# Patient Record
Sex: Female | Born: 1950
Health system: Southern US, Community
[De-identification: ages and names within clinical notes are randomized; demographics above are authoritative.]

## PROBLEM LIST (undated history)

## (undated) DIAGNOSIS — M722 Plantar fascial fibromatosis: Secondary | ICD-10-CM

## (undated) DIAGNOSIS — R197 Diarrhea, unspecified: Secondary | ICD-10-CM

## (undated) DIAGNOSIS — K589 Irritable bowel syndrome without diarrhea: Secondary | ICD-10-CM

## (undated) DIAGNOSIS — G4700 Insomnia, unspecified: Secondary | ICD-10-CM

## (undated) DIAGNOSIS — G43909 Migraine, unspecified, not intractable, without status migrainosus: Secondary | ICD-10-CM

## (undated) DIAGNOSIS — H269 Unspecified cataract: Secondary | ICD-10-CM

## (undated) DIAGNOSIS — I10 Essential (primary) hypertension: Secondary | ICD-10-CM

## (undated) DIAGNOSIS — F32A Depression, unspecified: Secondary | ICD-10-CM

## (undated) DIAGNOSIS — F329 Major depressive disorder, single episode, unspecified: Secondary | ICD-10-CM

## (undated) DIAGNOSIS — M810 Age-related osteoporosis without current pathological fracture: Secondary | ICD-10-CM

## (undated) HISTORY — DX: Irritable bowel syndrome, unspecified: K58.9

## (undated) HISTORY — PX: ABDOMINAL HYSTERECTOMY: SHX81

## (undated) HISTORY — PX: BREAST CYST ASPIRATION: SHX578

## (undated) HISTORY — DX: Essential (primary) hypertension: I10

## (undated) HISTORY — DX: Diarrhea, unspecified: R19.7

## (undated) HISTORY — DX: Age-related osteoporosis without current pathological fracture: M81.0

## (undated) HISTORY — DX: Migraine, unspecified, not intractable, without status migrainosus: G43.909

## (undated) HISTORY — PX: EYE SURGERY: SHX253

## (undated) HISTORY — DX: Unspecified cataract: H26.9

## (undated) HISTORY — DX: Major depressive disorder, single episode, unspecified: F32.9

## (undated) HISTORY — DX: Plantar fascial fibromatosis: M72.2

## (undated) HISTORY — DX: Insomnia, unspecified: G47.00

## (undated) HISTORY — DX: Depression, unspecified: F32.A

---

## 1983-11-10 HISTORY — PX: VESICOVAGINAL FISTULA CLOSURE W/ TAH: SUR271

## 1998-07-18 ENCOUNTER — Other Ambulatory Visit: Admission: RE | Admit: 1998-07-18 | Discharge: 1998-07-18 | Payer: Self-pay | Admitting: Gynecology

## 1999-05-29 ENCOUNTER — Other Ambulatory Visit: Admission: RE | Admit: 1999-05-29 | Discharge: 1999-05-29 | Payer: Self-pay | Admitting: Gynecology

## 1999-07-03 ENCOUNTER — Other Ambulatory Visit: Admission: RE | Admit: 1999-07-03 | Discharge: 1999-07-03 | Payer: Self-pay | Admitting: Gynecology

## 1999-07-10 ENCOUNTER — Other Ambulatory Visit: Admission: RE | Admit: 1999-07-10 | Discharge: 1999-07-10 | Payer: Self-pay | Admitting: Gynecology

## 2000-01-13 ENCOUNTER — Encounter: Admission: RE | Admit: 2000-01-13 | Discharge: 2000-01-13 | Payer: Self-pay | Admitting: Gynecology

## 2000-01-13 ENCOUNTER — Encounter: Payer: Self-pay | Admitting: Gynecology

## 2000-05-28 ENCOUNTER — Other Ambulatory Visit: Admission: RE | Admit: 2000-05-28 | Discharge: 2000-05-28 | Payer: Self-pay | Admitting: Gynecology

## 2001-04-13 ENCOUNTER — Encounter: Admission: RE | Admit: 2001-04-13 | Discharge: 2001-04-13 | Payer: Self-pay | Admitting: Gynecology

## 2001-06-13 ENCOUNTER — Other Ambulatory Visit: Admission: RE | Admit: 2001-06-13 | Discharge: 2001-06-13 | Payer: Self-pay | Admitting: Gynecology

## 2001-06-15 ENCOUNTER — Other Ambulatory Visit: Admission: RE | Admit: 2001-06-15 | Discharge: 2001-06-15 | Payer: Self-pay | Admitting: Gynecology

## 2002-05-09 ENCOUNTER — Encounter: Admission: RE | Admit: 2002-05-09 | Discharge: 2002-05-09 | Payer: Self-pay | Admitting: Gynecology

## 2002-05-09 ENCOUNTER — Encounter: Payer: Self-pay | Admitting: Gynecology

## 2002-07-20 ENCOUNTER — Other Ambulatory Visit: Admission: RE | Admit: 2002-07-20 | Discharge: 2002-07-20 | Payer: Self-pay | Admitting: Gynecology

## 2002-07-25 ENCOUNTER — Encounter: Payer: Self-pay | Admitting: Gynecology

## 2002-07-25 ENCOUNTER — Encounter: Admission: RE | Admit: 2002-07-25 | Discharge: 2002-07-25 | Payer: Self-pay | Admitting: Gynecology

## 2003-05-23 ENCOUNTER — Other Ambulatory Visit: Admission: RE | Admit: 2003-05-23 | Discharge: 2003-05-23 | Payer: Self-pay | Admitting: Gynecology

## 2003-06-01 ENCOUNTER — Encounter: Admission: RE | Admit: 2003-06-01 | Discharge: 2003-06-01 | Payer: Self-pay | Admitting: Gynecology

## 2003-06-01 ENCOUNTER — Encounter: Payer: Self-pay | Admitting: Gynecology

## 2004-06-09 ENCOUNTER — Other Ambulatory Visit: Admission: RE | Admit: 2004-06-09 | Discharge: 2004-06-09 | Payer: Self-pay | Admitting: Gynecology

## 2004-06-10 ENCOUNTER — Encounter: Admission: RE | Admit: 2004-06-10 | Discharge: 2004-06-10 | Payer: Self-pay | Admitting: Gynecology

## 2004-09-17 ENCOUNTER — Ambulatory Visit: Payer: Self-pay | Admitting: Pulmonary Disease

## 2005-06-16 ENCOUNTER — Other Ambulatory Visit: Admission: RE | Admit: 2005-06-16 | Discharge: 2005-06-16 | Payer: Self-pay | Admitting: Gynecology

## 2005-06-24 ENCOUNTER — Encounter: Admission: RE | Admit: 2005-06-24 | Discharge: 2005-06-24 | Payer: Self-pay | Admitting: Gynecology

## 2005-10-30 ENCOUNTER — Ambulatory Visit: Payer: Self-pay | Admitting: Pulmonary Disease

## 2005-11-09 HISTORY — PX: BUNIONECTOMY: SHX129

## 2006-03-10 ENCOUNTER — Encounter: Payer: Self-pay | Admitting: Gynecology

## 2006-06-18 ENCOUNTER — Other Ambulatory Visit: Admission: RE | Admit: 2006-06-18 | Discharge: 2006-06-18 | Payer: Self-pay | Admitting: Gynecology

## 2006-06-25 ENCOUNTER — Encounter: Admission: RE | Admit: 2006-06-25 | Discharge: 2006-06-25 | Payer: Self-pay | Admitting: Gynecology

## 2006-10-25 ENCOUNTER — Ambulatory Visit: Payer: Self-pay | Admitting: Gastroenterology

## 2006-11-05 ENCOUNTER — Ambulatory Visit: Payer: Self-pay | Admitting: Internal Medicine

## 2006-11-12 ENCOUNTER — Ambulatory Visit: Payer: Self-pay | Admitting: Pulmonary Disease

## 2007-06-21 ENCOUNTER — Other Ambulatory Visit: Admission: RE | Admit: 2007-06-21 | Discharge: 2007-06-21 | Payer: Self-pay | Admitting: Gynecology

## 2007-07-06 ENCOUNTER — Encounter: Admission: RE | Admit: 2007-07-06 | Discharge: 2007-07-06 | Payer: Self-pay | Admitting: Gynecology

## 2007-12-01 ENCOUNTER — Telehealth (INDEPENDENT_AMBULATORY_CARE_PROVIDER_SITE_OTHER): Payer: Self-pay | Admitting: *Deleted

## 2007-12-01 DIAGNOSIS — I10 Essential (primary) hypertension: Secondary | ICD-10-CM | POA: Insufficient documentation

## 2007-12-01 HISTORY — DX: Essential (primary) hypertension: I10

## 2007-12-02 ENCOUNTER — Ambulatory Visit: Payer: Self-pay | Admitting: Pulmonary Disease

## 2007-12-02 DIAGNOSIS — F329 Major depressive disorder, single episode, unspecified: Secondary | ICD-10-CM

## 2007-12-08 LAB — CONVERTED CEMR LAB
BUN: 9 mg/dL (ref 6–23)
Basophils Relative: 0.2 % (ref 0.0–1.0)
CO2: 31 meq/L (ref 19–32)
Creatinine, Ser: 0.8 mg/dL (ref 0.4–1.2)
Eosinophils Absolute: 0 10*3/uL (ref 0.0–0.6)
Eosinophils Relative: 0.1 % (ref 0.0–5.0)
HCT: 43.9 % (ref 36.0–46.0)
MCV: 87.4 fL (ref 78.0–100.0)
Monocytes Absolute: 0.4 10*3/uL (ref 0.2–0.7)
Monocytes Relative: 7.6 % (ref 3.0–11.0)
Neutro Abs: 3 10*3/uL (ref 1.4–7.7)
Neutrophils Relative %: 64.7 % (ref 43.0–77.0)
Platelets: 166 10*3/uL (ref 150–400)
Potassium: 3.5 meq/L (ref 3.5–5.1)
Sodium: 139 meq/L (ref 135–145)
TSH: 0.73 microintl units/mL (ref 0.35–5.50)
WBC: 4.7 10*3/uL (ref 4.5–10.5)

## 2008-01-02 ENCOUNTER — Telehealth: Payer: Self-pay | Admitting: Adult Health

## 2008-01-02 ENCOUNTER — Encounter: Payer: Self-pay | Admitting: Adult Health

## 2008-01-02 ENCOUNTER — Ambulatory Visit: Payer: Self-pay | Admitting: Pulmonary Disease

## 2008-01-02 LAB — CONVERTED CEMR LAB
Inflenza A Ag: NEGATIVE
Influenza B Ag: NEGATIVE

## 2008-01-19 ENCOUNTER — Ambulatory Visit: Payer: Self-pay | Admitting: Pulmonary Disease

## 2008-02-07 ENCOUNTER — Telehealth (INDEPENDENT_AMBULATORY_CARE_PROVIDER_SITE_OTHER): Payer: Self-pay | Admitting: *Deleted

## 2008-04-23 ENCOUNTER — Ambulatory Visit: Payer: Self-pay | Admitting: Pulmonary Disease

## 2008-04-23 DIAGNOSIS — R51 Headache: Secondary | ICD-10-CM

## 2008-04-23 DIAGNOSIS — M722 Plantar fascial fibromatosis: Secondary | ICD-10-CM | POA: Insufficient documentation

## 2008-04-23 DIAGNOSIS — R519 Headache, unspecified: Secondary | ICD-10-CM | POA: Insufficient documentation

## 2008-04-23 DIAGNOSIS — R197 Diarrhea, unspecified: Secondary | ICD-10-CM

## 2008-07-10 ENCOUNTER — Encounter: Admission: RE | Admit: 2008-07-10 | Discharge: 2008-07-10 | Payer: Self-pay | Admitting: Gynecology

## 2008-08-27 ENCOUNTER — Telehealth (INDEPENDENT_AMBULATORY_CARE_PROVIDER_SITE_OTHER): Payer: Self-pay | Admitting: *Deleted

## 2008-11-07 ENCOUNTER — Telehealth: Payer: Self-pay | Admitting: Pulmonary Disease

## 2010-04-14 ENCOUNTER — Ambulatory Visit: Payer: Self-pay | Admitting: Pulmonary Disease

## 2010-04-14 ENCOUNTER — Telehealth (INDEPENDENT_AMBULATORY_CARE_PROVIDER_SITE_OTHER): Payer: Self-pay | Admitting: *Deleted

## 2010-04-14 DIAGNOSIS — G4700 Insomnia, unspecified: Secondary | ICD-10-CM | POA: Insufficient documentation

## 2010-12-09 NOTE — Assessment & Plan Note (Signed)
Summary: headache-ok per SN / cj   CC:  2 year ROV & add-on for migraine....  History of Present Illness: 60 y/o WF here for a follow up visit...    ~  April 14, 2010:  she called requesting an add-on appt after 67yr hiatus due to HA... hx migraines w/ Eval & Rx from DrLewitt in the past> she c/o the co-pay for specialists & hasn't been back to see him... prev well controlled on Topamax 100mg /d, Imipramine 50mg - 2 tabs daily, and Imitrex 100mg  as needed (she ran out of the Topamax & Imitrex w/ onset of HA this week)... also has hx HBP & prev well controlled on Lisinopril 40mg /d + HCTZ 25mg /d... notes BP up when she is in pain w/ HA & not the other way around... also notes difficulty sleeping w/ hx chronic persisitant insomnia- ran out of Ambien & not resting at all... we discussed restarting her regular med regimen and scheduling a CPX w/ CXR, EKG, fasting labs for later this yr...   Current Problem List:  HYPERTENSION (ICD-401.9) - controlled on LISINOPRIL 40mg /d, and HCTZ 25mg /d... BP= 140/90 w/ HA today> she tolerates meds well, takes regularly & home BP checks are OK when not in pain... denies fatigue, visual changes, CP, palipit, dizziness, syncope, dyspnea, edema, etc...  Hx of DIARRHEA (ICD-787.91) - on QUESTRAN 4gm packet daily as needed... this really helps her prev chr diarrhea problem and she feels it has been a life-saver!  Hx of PLANTAR FASCIITIS (ICD-728.71) - Eval and Rx by DrBednarz in past... she uses Ibuprofen Prn.  Hx of HEADACHE (ICD-784.0) - eval by DrLewitt and treated w/ TOPAMAX 100mg /d, IMIPRAMINE 50mg - 2daily, & IMITREX 100mg Prn... she still gets HA's but feels that they are manageable w/ this regimen...  Hx of DEPRESSION (ICD-311) - prev on Lexapro perscribed by DrLomax... she feels that it helped but ran out & doing OK since then (her husb died of sudden cardiac death 12/13/22)...  INSOMNIA, CHRONIC (ICD-307.42) - she has chronic persistant insomnia treated w/ ZOLPIDEM  10mg  Qhs...   Health Maintenance:  she had full labs from [place] in 2004, and from DrLomax in 2007 & all WNL.Marland Kitchen.  ~  GYN= DrLomax w/ hysterectomy in 1985... on VIVELLE patch... he does BMD's w/ report of Osteopenia 8/07...  ~  GI = DrPerry w/ colonoscopy 12/07 that was WNL... f/u planned 25yrs.  ~  Immunizations:  she gets the yearly Flu vaccine.   Preventive Screening-Counseling & Management  Alcohol-Tobacco     Smoking Status: never  Allergies (verified): No Known Drug Allergies  Comments:  Nurse/Medical Assistant: The patient's medications and allergies were reviewed with the patient and were updated in the Medication and Allergy Lists.  Past History:  Past Medical History: HYPERTENSION (ICD-401.9) Hx of DIARRHEA (ICD-787.91) Hx of PLANTAR FASCIITIS (ICD-728.71) Hx of HEADACHE (ICD-784.0) DEPRESSION (ICD-311) INSOMNIA, CHRONIC (ICD-307.42)  Past Surgical History: S/P hysterectomy in 1985 by DrLomax S/P right bunionectomy in 2007 by DrBednarz  Family History: Reviewed history and no changes required.  Social History: Reviewed history and no changes required. never smoked  Review of Systems      See HPI       The patient complains of headaches.  The patient denies anorexia, fever, weight loss, weight gain, vision loss, decreased hearing, hoarseness, chest pain, syncope, dyspnea on exertion, peripheral edema, prolonged cough, hemoptysis, abdominal pain, melena, hematochezia, severe indigestion/heartburn, hematuria, incontinence, muscle weakness, suspicious skin lesions, transient blindness, difficulty walking, depression, unusual weight change, abnormal bleeding,  enlarged lymph nodes, and angioedema.    Vital Signs:  Patient profile:   60 year old female Height:      63 inches Weight:      151.31 pounds BMI:     26.90 O2 Sat:      100 % on Room air Temp:     98.1 degrees F oral Pulse rate:   105 / minute BP sitting:   140 / 90  (right arm) Cuff size:    regular  Vitals Entered By: Randell Loop CMA (April 14, 2010 3:31 PM)  O2 Sat at Rest %:  100 O2 Flow:  Room air CC: 2 year ROV & add-on for migraine... Is Patient Diabetic? No Pain Assessment Patient in pain? yes      Onset of pain  headahce that no meds have helped   Physical Exam  Additional Exam:  WD, WN, 60 y/o WF in NAD... GENERAL:  Alert & oriented; pleasant & cooperative... HEENT:  [place]/AT, EOM-wnl, PERRLA, EACs-clear, TMs-wnl, NOSE-clear, THROAT-clear & wnl. NECK:  Supple w/ full ROM; no JVD; normal carotid impulses w/o bruits; no thyromegaly or nodules palpated; no lymphadenopathy. CHEST:  Clear to P & A; without wheezes/ rales/ or rhonchi. HEART:  Regular Rhythm; without murmurs/ rubs/ or gallops. ABDOMEN:  Soft & nontender; normal bowel sounds; no organomegaly or masses detected. EXT: without deformities or arthritic changes; no varicose veins/ venous insuffic/ or edema. NEURO:  CN's intact; motor testing normal; sensory testing normal; gait normal & balance OK. DERM:  No lesions noted; no rash etc...    Impression & Recommendations:  Problem # 1:  Hx of HEADACHE (ICD-784.0) She has hx migraines w/ prev eval DrLewitt... prev well controlled on regimen but ran out of mult drugs... we discussed re-starting meds & f/u later this yr... Topamax 100mg /d,  Imipramine 50mg - 2 daily,  Imitrex 100mg  as needed... Her updated medication list for this problem includes:    Imitrex 100 Mg Tabs (Sumatriptan succinate) .Marland Kitchen... As needed for migraine    Eq Ibuprofen 200 Mg Tabs (Ibuprofen) .Marland Kitchen... As needed  Problem # 2:  HYPERTENSION (ICD-401.9) Controlled on meds... BP tends to be elevated w/ pain esp HAs... refill meds, take them daily & monitor BOP at home- call for problems... Her updated medication list for this problem includes:    Lisinopril 40 Mg Tabs (Lisinopril) .Marland Kitchen... Take 1 tab by mouth once daily...    Hydrochlorothiazide 25 Mg Tabs (Hydrochlorothiazide) .Marland Kitchen... Take 1 tablet  by mouth once a day  Problem # 3:  INSOMNIA, CHRONIC (ICD-307.42) We discussed her chr persistant insomnia & the meed for gen AMBIEN 10mg  Qhs...   Problem # 4:  Hx of DIARRHEA (ICD-787.91) She uses the Questran Prn...  Problem # 5:  DEPRESSION (ICD-311) Prev on Lexapro but doing OK off this meds & doesn't want to restart at this point in time... The following medications were removed from the medication list:    Lexapro 20 Mg Tabs (Escitalopram oxalate) .Marland Kitchen... Take 1 tablet by mouth once a day Her updated medication list for this problem includes:    Imipramine Hcl 50 Mg Tabs (Imipramine hcl) .Marland Kitchen... Take 2  tablet by mouth once a day  Complete Medication List: 1)  Lisinopril 40 Mg Tabs (Lisinopril) .... Take 1 tab by mouth once daily.Marland KitchenMarland Kitchen 2)  Hydrochlorothiazide 25 Mg Tabs (Hydrochlorothiazide) .... Take 1 tablet by mouth once a day 3)  Questran 4 Gm Pack (Cholestyramine) .... Mix one packet daily with juice or apple  sauce 4)  Vivelle-dot 0.05 Mg/24hr Pttw (Estradiol) .... Twice a week 5)  Topamax 100 Mg Tabs (Topiramate) .... Take 1 tablet by mouth once a day 6)  Imitrex 100 Mg Tabs (Sumatriptan succinate) .... As needed for migraine 7)  Eq Ibuprofen 200 Mg Tabs (Ibuprofen) .... As needed 8)  Imipramine Hcl 50 Mg Tabs (Imipramine hcl) .... Take 2  tablet by mouth once a day 9)  Zolpidem Tartrate 10 Mg Tabs (Zolpidem tartrate) .... Take one tablet by mouth at bedtime for sleep  Patient Instructions: 1)  Today we updated your med list- see below.... 2)  We refilled your meds for 2011... 3)  It's been several yrs for a physical w/ CXR, EKG, & Fasting labs work... we should set this up sometime in the fall... 4)  Call for any problems... Prescriptions: ZOLPIDEM TARTRATE 10 MG TABS (ZOLPIDEM TARTRATE) TAKE ONE TABLET by mouth at bedtime FOR SLEEP  #30 x 6   Entered and Authorized by:   Michele Mcalpine MD   Signed by:   Michele Mcalpine MD on 04/14/2010   Method used:   Print then Give to  Patient   RxID:   [id number] IMIPRAMINE HCL 50 MG  TABS (IMIPRAMINE HCL) Take 2  tablet by mouth once a day  #180 x 4   Entered and Authorized by:   Michele Mcalpine MD   Signed by:   Michele Mcalpine MD on 04/14/2010   Method used:   Print then Give to Patient   RxID:   [id number] IMITREX 100 MG  TABS (SUMATRIPTAN SUCCINATE) as needed for migraine  #9 x prn   Entered and Authorized by:   Michele Mcalpine MD   Signed by:   Michele Mcalpine MD on 04/14/2010   Method used:   Print then Give to Patient   RxID:   [id number] TOPAMAX 100 MG  TABS (TOPIRAMATE) Take 1 tablet by mouth once a day  #90 x 4   Entered and Authorized by:   Michele Mcalpine MD   Signed by:   Michele Mcalpine MD on 04/14/2010   Method used:   Print then Give to Patient   RxID:   [id number] VIVELLE-DOT 0.05 MG/24HR  PTTW (ESTRADIOL) twice a week  #3 mo supply x 4   Entered and Authorized by:   Michele Mcalpine MD   Signed by:   Michele Mcalpine MD on 04/14/2010   Method used:   Print then Give to Patient   RxID:   [id number] QUESTRAN 4 GM  PACK (CHOLESTYRAMINE) mix one packet daily with juice or apple sauce  #90 x 4   Entered and Authorized by:   Michele Mcalpine MD   Signed by:   Michele Mcalpine MD on 04/14/2010   Method used:   Print then Give to Patient   RxID:   [id number] HYDROCHLOROTHIAZIDE 25 MG  TABS (HYDROCHLOROTHIAZIDE) Take 1 tablet by mouth once a day  #90 x 4   Entered and Authorized by:   Michele Mcalpine MD   Signed by:   Michele Mcalpine MD on 04/14/2010   Method used:   Print then Give to Patient   RxID:   [id number] LISINOPRIL 40 MG  TABS (LISINOPRIL) take 1 tab by mouth once daily...  #90 x 4   Entered and Authorized by:   Michele Mcalpine MD   Signed by:   Michele Mcalpine MD on 04/14/2010  Method used:   Print then Give to Patient   RxID:   [id number]

## 2010-12-09 NOTE — Progress Notes (Signed)
Summary: appt  Phone Note Call from Patient Call back at Home Phone [phone] Call back at [phone]   Caller: Patient Call For: nadel Reason for Call: Talk to Nurse Summary of Call: terrible headache, woke during night, want to see SN.  Her mother Tally Joe coming in this pm.  Can you see her then. Initial call taken by: Eugene Gavia,  April 14, 2010 9:32 AM  Follow-up for Phone Call        Spoke with pt.  Pt states she has "a terrible headache and woke up with it during the night."  Denies blurred vision and any other symptoms. Pt states mother is being seen by SN at 330 today and requesting to be seen then by him too.  However, if pt is unable to be worked in with SN today she is requesting to see TP today.  Will forward message to SN-pls advise if it is ok to work pt in today. Thanks! Gweneth Dimitri RN  April 14, 2010 9:37 AM   Additional Follow-up for Phone Call Additional follow up Details #1::        per SN---add pt on to schedule this afternoon please.  thanks Randell Loop CMA  April 14, 2010 11:54 AM     Additional Follow-up for Phone Call Additional follow up Details #2::    appt scheduled in IDX.  Pt aware SN will work her in this afternoon.  Gweneth Dimitri RN  April 14, 2010 11:57 AM

## 2011-03-04 ENCOUNTER — Telehealth: Payer: Self-pay | Admitting: Pulmonary Disease

## 2011-03-04 MED ORDER — TOPIRAMATE 100 MG PO TABS: 100.0000 mg | ORAL_TABLET | Freq: Every day | ORAL | Status: DC

## 2011-03-04 MED ORDER — HYDROCHLOROTHIAZIDE 50 MG PO TABS: 25.0000 mg | ORAL_TABLET | Freq: Every day | ORAL | Status: DC

## 2011-03-04 MED ORDER — IMIPRAMINE HCL 50 MG PO TABS: ORAL_TABLET | ORAL | Status: DC

## 2011-03-04 NOTE — Telephone Encounter (Addendum)
Last seen April 14, 2010.  Pt is scheduled for follow-up on 04/01/2011 @ 3:30 pm with SN. RX for Topamax and HCTZ sent to [facility] in [place]. Pt aware she will need to keep this appt for further refills.

## 2011-03-04 NOTE — Telephone Encounter (Signed)
Pt aware rx sent to pharmacy and she needs to keep f/u for further refills.

## 2011-03-23 ENCOUNTER — Other Ambulatory Visit: Payer: Self-pay | Admitting: *Deleted

## 2011-03-23 MED ORDER — TOPIRAMATE 100 MG PO TABS: 100.0000 mg | ORAL_TABLET | Freq: Two times a day (BID) | ORAL | Status: DC

## 2011-03-25 ENCOUNTER — Encounter: Payer: Self-pay | Admitting: Pulmonary Disease

## 2011-03-27 ENCOUNTER — Telehealth: Payer: Self-pay | Admitting: Pulmonary Disease

## 2011-03-27 NOTE — Telephone Encounter (Signed)
Pt says [facility] never received refill on Topamax. I called [facility] and gave verbal for Topamax refill for # 60 with 5 refills as sent by Leigh on 03/23/2011. Pt is aware.

## 2011-03-30 ENCOUNTER — Telehealth: Payer: Self-pay | Admitting: Pulmonary Disease

## 2011-03-30 NOTE — Telephone Encounter (Signed)
We've already called this in multiple times!! --on May 14 and again on May 18. Called [facility] and spoke with Baldo Ash who stated he did not get the rx- therefore gave a verbal order for Topamax 100mg  # 60 x 5 refills.  Called and spoke with pt and informed her of the above information.

## 2011-04-01 ENCOUNTER — Ambulatory Visit (INDEPENDENT_AMBULATORY_CARE_PROVIDER_SITE_OTHER): Payer: Self-pay | Admitting: Pulmonary Disease

## 2011-04-01 ENCOUNTER — Encounter: Payer: Self-pay | Admitting: Pulmonary Disease

## 2011-04-01 DIAGNOSIS — G4700 Insomnia, unspecified: Secondary | ICD-10-CM

## 2011-04-01 DIAGNOSIS — I10 Essential (primary) hypertension: Secondary | ICD-10-CM

## 2011-04-01 DIAGNOSIS — R197 Diarrhea, unspecified: Secondary | ICD-10-CM

## 2011-04-01 DIAGNOSIS — R51 Headache: Secondary | ICD-10-CM

## 2011-04-01 MED ORDER — SUMATRIPTAN SUCCINATE 100 MG PO TABS: 100.0000 mg | ORAL_TABLET | Freq: Once | ORAL | Status: DC | PRN

## 2011-04-01 MED ORDER — ZOLPIDEM TARTRATE 10 MG PO TABS: 10.0000 mg | ORAL_TABLET | Freq: Every evening | ORAL | Status: DC | PRN

## 2011-04-01 MED ORDER — LISINOPRIL 40 MG PO TABS: 40.0000 mg | ORAL_TABLET | Freq: Every day | ORAL | Status: DC

## 2011-04-01 MED ORDER — CHOLESTYRAMINE 4 G PO PACK: 1.0000 | PACK | Freq: Every day | ORAL | Status: DC | PRN

## 2011-04-01 MED ORDER — TOPIRAMATE 100 MG PO TABS: 100.0000 mg | ORAL_TABLET | Freq: Two times a day (BID) | ORAL | Status: DC

## 2011-04-01 MED ORDER — HYDROCHLOROTHIAZIDE 25 MG PO TABS: 25.0000 mg | ORAL_TABLET | Freq: Every day | ORAL | Status: DC

## 2011-04-01 MED ORDER — IMIPRAMINE HCL 50 MG PO TABS: ORAL_TABLET | ORAL | Status: DC

## 2011-04-01 NOTE — Patient Instructions (Signed)
Today we updated your med list in EPIC...    Continue your current meds the same> we refilled your meds for 90d supplies as requested...  Let's get on track w/ diet & exercise...  Call for any problems.Marland KitchenMarland Kitchen

## 2011-04-01 NOTE — Progress Notes (Signed)
Subjective:    Patient ID: Kelli Contreras, female    DOB: 1951/03/02, 60 y.o.   MRN: [id number]  HPI 60 y/o WF here for a follow up visit...   ~  April 14, 2010:  she called requesting an add-on appt after 51yr hiatus due to HA... hx migraines w/ Eval & Rx from DrLewitt in the past> she c/o the co-pay for specialists & hasn't been back to see him... prev well controlled on Topamax 100mg /d, Imipramine 50mg - 2 tabs daily, and Imitrex 100mg  as needed (she ran out of the Topamax & Imitrex w/ onset of HA this week)... also has hx HBP & prev well controlled on Lisinopril 40mg /d + HCTZ 25mg /d... notes BP up when she is in pain w/ HA & not the other way around... also notes difficulty sleeping w/ hx chronic persisitant insomnia- ran out of Ambien & not resting at all... we discussed restarting her regular med regimen and scheduling a CPX w/ CXR, EKG, fasting labs for later this yr...  ~  May 23,2012:  Yearly ROV & she requests refill prescriptions... She hasn't had CPX- labs, Xray, EKG, etc- due to no insurance & requests that we don't do these important screening procedures at this time... 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          Problem List:  HYPERTENSION (ICD-401.9) - controlled on LISINOPRIL 40mg /d, and HCTZ 25mg /d... BP= 138/84 today> she tolerates meds well, takes regularly & home BP checks are OK when not in pain... denies fatigue, visual changes, CP, palipit, dizziness, syncope, dyspnea, edema, etc...  Hx of DIARRHEA (ICD-787.91) - on QUESTRAN 4gm packet daily as needed... this really helps her prev chr diarrhea problem and she feels it has been a life-saver!  Hx of PLANTAR FASCIITIS (ICD-728.71) - Eval and Rx by DrBednarz in past... she uses Ibuprofen Prn.  Hx of HEADACHE (ICD-784.0) - eval by DrLewitt and treated w/ TOPAMAX 100mg /d,  IMIPRAMINE 50mg - 2daily, & IMITREX 100mg Prn... she still gets HA's but feels that they are manageable w/ this regimen...  She requests refill Rx from [place] to to cost of seeing DrLewitt & the fact that she has been well controlled on a stable med regimen...  Hx of DEPRESSION (ICD-311) - prev on Lexapro perscribed by DrLomax... she feels that it helped but ran out & doing OK since then (her husb died of sudden cardiac death 2022/12/04)...  INSOMNIA, CHRONIC (ICD-307.42) - she has chronic persistant insomnia treated w/ ZOLPIDEM 10mg  Qhs...  We discussed alternative OTC Rx w/ Melatonin & TylenolPM.  Health Maintenance:  she had full labs from [place] in 2004, and from DrLomax in 2007 & all WNL.Marland Kitchen. ~  GYN= DrLomax w/ hysterectomy in 1985... on VIVELLE patch... he does BMD's w/ report of Osteopenia 8/07... ~  GI = DrPerry w/ colonoscopy 12/07 that was WNL... f/u planned 9yrs. ~  Immunizations:  she gets the yearly Flu vaccine.   Past Surgical History  Procedure Date  . Vesicovaginal fistula closure w/ tah 1985    Dr. Nicholas Lose  . Bunionectomy 2007    Dr. Lestine Box    Outpatient Encounter Prescriptions as of 04/01/2011  Medication Sig Dispense Refill  . cholestyramine (QUESTRAN) 4 G packet Take 1 packet by mouth daily as needed.        . hydrochlorothiazide 50 MG tablet  Take 0.5 tablets (25 mg total) by mouth daily.  30 tablet  0  . ibuprofen (ADVIL,MOTRIN) 200 MG tablet Take 200 mg by mouth every 6 (six) hours as needed.        Marland Kitchen imipramine (TOFRANIL) 50 MG tablet 2 tablets once a day  60 tablet  0  . lisinopril (PRINIVIL,ZESTRIL) 40 MG tablet Take 40 mg by mouth daily.        Marland Kitchen topiramate (TOPAMAX) 100 MG tablet Take 1 tablet (100 mg total) by mouth 2 (two) times daily.  60 tablet  5  . zolpidem (AMBIEN) 10 MG tablet Take 10 mg by mouth at bedtime as needed.          Allergies not on file >> NKDA...   Review of Systems         See HPI - all other systems neg except as noted... The patient complains of  headaches.  The patient denies anorexia, fever, weight loss, weight gain, vision loss, decreased hearing, hoarseness, chest pain, syncope, dyspnea on exertion, peripheral edema, prolonged cough, hemoptysis, abdominal pain, melena, hematochezia, severe indigestion/heartburn, hematuria, incontinence, muscle weakness, suspicious skin lesions, transient blindness, difficulty walking, depression, unusual weight change, abnormal bleeding, enlarged lymph nodes, and angioedema.     Objective:   Physical Exam     WD, WN, 60 y/o WF in NAD... GENERAL:  Alert & oriented; pleasant & cooperative... HEENT:  [place]/AT, EOM-wnl, PERRLA, EACs-clear, TMs-wnl, NOSE-clear, THROAT-clear & wnl. NECK:  Supple w/ full ROM; no JVD; normal carotid impulses w/o bruits; no thyromegaly or nodules palpated; no lymphadenopathy. CHEST:  Clear to P & A; without wheezes/ rales/ or rhonchi. HEART:  Regular Rhythm; without murmurs/ rubs/ or gallops. ABDOMEN:  Soft & nontender; normal bowel sounds; no organomegaly or masses detected. EXT: without deformities or arthritic changes; no varicose veins/ venous insuffic/ or edema. NEURO:  CN's intact; motor testing normal; sensory testing normal; gait normal & balance OK. DERM:  No lesions noted; no rash etc...   Assessment & Plan:   HBP>  Well controlled on Lisinopril & HCT Rx, continue same...  Headaches>  Prev thorough eval by DrLewitt & she has been well controlled on regimen of Topamax, Imipramine, Imitrex & she requests refills from [place]...  Insomnia>  Stable on Ambien for her CPI problem...  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

## 2011-04-05 ENCOUNTER — Encounter: Payer: Self-pay | Admitting: Pulmonary Disease

## 2011-05-26 ENCOUNTER — Telehealth: Payer: Self-pay | Admitting: Pulmonary Disease

## 2011-05-26 NOTE — Telephone Encounter (Signed)
PATIENT RETURNED CALL.  SHE REALLY NEEDS TO TALK TO DR NADEL.

## 2011-05-26 NOTE — Telephone Encounter (Signed)
Spoke with the pt and she request to speak directly to Dr. Kriste Basque. I advised he is with patients but I could get him a message for her. She states she needs to speak to him about writing her a letter for employment. 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 Kriste Basque would know what she meant/ Pt refused to give any more details and asked that SN call her. I advised I will forward the message. Carron Curie, CMA

## 2011-05-26 NOTE — Telephone Encounter (Signed)
[email or web address].Damari Suastegui, CMA  

## 2011-05-27 NOTE — Telephone Encounter (Signed)
Pt return call. Kelli Contreras  °

## 2011-05-28 NOTE — Telephone Encounter (Signed)
PT RETURNED CALL. SAYS DR NADEL HAD CALLED HER BUT CALLED THE HOUSE PHONE. PT ASKS THAT SHE BE CALLED AT [phone]. Kelli Contreras

## 2011-05-29 NOTE — Telephone Encounter (Signed)
PT CALLED BACK AGAIN- REQUESTS TO SPEAK TO DR NADEL. [phone]. Kelli Contreras

## 2011-06-01 NOTE — Telephone Encounter (Signed)
[phone] pt called back  Kelli Contreras

## 2011-06-02 NOTE — Telephone Encounter (Signed)
LMTCB

## 2011-06-03 NOTE — Telephone Encounter (Signed)
Pt returning call can be reached at [phone].Kelli Contreras

## 2011-06-04 NOTE — Telephone Encounter (Signed)
Pt states she spoke with Dr. Kriste Basque about the letter and nothing further needed. Carron Curie, CMA

## 2011-06-05 ENCOUNTER — Telehealth: Payer: Self-pay | Admitting: Emergency Medicine

## 2011-06-05 NOTE — Telephone Encounter (Signed)
Contacted by pt this pm to inform me that she has had sinus pressure, facial pain, tooth pain for about 18 hours. Some mild nasal drainage, not purulent, no fevers. I asked her to start using warm compresses, decongestants. Called in script for Avelox 400mg  po qd x 10 days to [facility] [phone] at her request.

## 2011-06-10 ENCOUNTER — Telehealth: Payer: Self-pay | Admitting: Pulmonary Disease

## 2011-06-10 NOTE — Telephone Encounter (Signed)
Leigh I can not tell you called her, pls advise thanks

## 2011-06-10 NOTE — Telephone Encounter (Signed)
Called and spoke with pt and she will bring her info up here tomorrow. 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  i will request her chart again to double check to see if there was any letter in there around that time from [facility].

## 2011-06-11 NOTE — Telephone Encounter (Signed)
Pt came up here and dropped off letter from [facility].  She left before i got a chance to speak to her and then called back.  i was busy with pts and was unable to speak with the pt and then she showed up again.  TD spoke with pt about what SN recs and if she needs a letter done then she will need to let us know.  Pt stated that she will be back this afternoon with the letter.

## 2011-09-29 ENCOUNTER — Telehealth: Payer: Self-pay | Admitting: Pulmonary Disease

## 2011-09-29 MED ORDER — LISINOPRIL 40 MG PO TABS: 40.0000 mg | ORAL_TABLET | Freq: Every day | ORAL | Status: DC

## 2011-09-29 MED ORDER — TOPIRAMATE 100 MG PO TABS: 100.0000 mg | ORAL_TABLET | Freq: Two times a day (BID) | ORAL | Status: DC

## 2011-09-29 NOTE — Telephone Encounter (Signed)
Spoke with pt to verify the msg. Rxs were sent to pharm 90 day supply per pt request.

## 2011-09-29 NOTE — Telephone Encounter (Signed)
LMOMTCB x 1. Need to verify which medications are needed.

## 2011-10-02 ENCOUNTER — Other Ambulatory Visit (HOSPITAL_COMMUNITY): Payer: Self-pay | Admitting: Gynecology

## 2011-10-02 DIAGNOSIS — Z1231 Encounter for screening mammogram for malignant neoplasm of breast: Secondary | ICD-10-CM

## 2011-11-09 ENCOUNTER — Ambulatory Visit (HOSPITAL_COMMUNITY)
Admission: RE | Admit: 2011-11-09 | Discharge: 2011-11-09 | Disposition: A | Discharge status: Home / Self Care | Payer: Self-pay | Source: Ambulatory Visit | Attending: Gynecology | Admitting: Gynecology

## 2011-11-09 DIAGNOSIS — Z1231 Encounter for screening mammogram for malignant neoplasm of breast: Secondary | ICD-10-CM

## 2011-12-02 ENCOUNTER — Other Ambulatory Visit: Payer: Self-pay | Admitting: Allergy

## 2011-12-02 MED ORDER — HYDROCHLOROTHIAZIDE 25 MG PO TABS: 25.0000 mg | ORAL_TABLET | Freq: Every day | ORAL | Status: DC

## 2011-12-02 NOTE — Telephone Encounter (Signed)
[facility]  [address] hctz 25 mg  1 poqd #90

## 2011-12-14 ENCOUNTER — Telehealth: Payer: Self-pay | Admitting: Pulmonary Disease

## 2011-12-14 MED ORDER — HYDROCHLOROTHIAZIDE 25 MG PO TABS: 25.0000 mg | ORAL_TABLET | Freq: Every day | ORAL | Status: DC

## 2011-12-14 MED ORDER — LISINOPRIL 20 MG PO TABS: 20.0000 mg | ORAL_TABLET | Freq: Two times a day (BID) | ORAL | Status: DC

## 2011-12-14 NOTE — Telephone Encounter (Signed)
Called and verified with pt request for HCTZ 25mg  qd and to change Lisinopril 40mg  qd to Lisinopril 20mg  bid because she does not have insurance and can get a 3 month supply at [facility] for $10. Spoke with LA and she advised okay to change Lisinopril to 20mg  bid for insurance purposes after speaking with SN. Rx refills sent to pharmacy and pt aware.

## 2012-04-18 ENCOUNTER — Other Ambulatory Visit: Payer: Self-pay | Admitting: Pulmonary Disease

## 2012-04-28 ENCOUNTER — Other Ambulatory Visit: Payer: Self-pay | Admitting: Pulmonary Disease

## 2012-05-03 ENCOUNTER — Telehealth: Payer: Self-pay | Admitting: Pulmonary Disease

## 2012-05-03 NOTE — Telephone Encounter (Signed)
Pt last seen 04/06/11 told to f/u 1 year. No pending apt. Please advise if okay to refill SN thanks

## 2012-05-04 ENCOUNTER — Telehealth: Payer: Self-pay | Admitting: Pulmonary Disease

## 2012-05-04 MED ORDER — IMIPRAMINE HCL 50 MG PO TABS: ORAL_TABLET | ORAL | Status: DC

## 2012-05-04 MED ORDER — TOPIRAMATE 100 MG PO TABS: 100.0000 mg | ORAL_TABLET | Freq: Two times a day (BID) | ORAL | Status: DC

## 2012-05-04 NOTE — Telephone Encounter (Signed)
Per SN---pt will need to schedule a follow up but ok to refill the topamax.  Thanks.

## 2012-05-04 NOTE — Telephone Encounter (Signed)
My mistake- I had sent the imipramine by accident. I called and spoke with pharmacist and cancelled this rx and called in the topamax for pt. I spoke with her and explained what happened and she verbalized understanding and states nothing further needed.

## 2012-05-04 NOTE — Telephone Encounter (Signed)
Spoke with pt and advised will refill med, but needs appt scheduled. I have sent refill to pharm and appt scheduled for 06-10-12.

## 2012-06-10 ENCOUNTER — Telehealth: Payer: Self-pay | Admitting: Pulmonary Disease

## 2012-06-10 ENCOUNTER — Ambulatory Visit: Payer: Self-pay | Admitting: Pulmonary Disease

## 2012-06-10 NOTE — Telephone Encounter (Signed)
Spoke with pt and she states that her issue was already taken care of by TD and nothing further needed.

## 2012-06-14 NOTE — Telephone Encounter (Signed)
This has been done per the pts request and placed in the mail to her.

## 2012-06-14 NOTE — Telephone Encounter (Signed)
Leigh, do you have these papers pt dropped off because ittates she is wanting a call back because she has questions.

## 2012-08-01 ENCOUNTER — Telehealth: Payer: Self-pay | Admitting: Pulmonary Disease

## 2012-08-01 MED ORDER — IMIPRAMINE HCL 50 MG PO TABS: ORAL_TABLET | ORAL | Status: DC

## 2012-08-01 NOTE — Telephone Encounter (Signed)
1 month supply was sent to [facility] for the pt to be made aware and also advised that she keep f/u for future refills

## 2012-08-19 ENCOUNTER — Telehealth: Payer: Self-pay | Admitting: Pulmonary Disease

## 2012-08-19 MED ORDER — TOPIRAMATE 100 MG PO TABS: 100.0000 mg | ORAL_TABLET | Freq: Two times a day (BID) | ORAL | Status: DC

## 2012-08-19 NOTE — Telephone Encounter (Signed)
I spoke with [facility] and was advised pt has refills on her lisinopril. I called and spoke with pt and made her aware of this. I have sent Topamax to the pharmacy. Nothing further was needed

## 2012-08-29 ENCOUNTER — Encounter: Payer: Self-pay | Admitting: *Deleted

## 2012-08-30 ENCOUNTER — Other Ambulatory Visit (INDEPENDENT_AMBULATORY_CARE_PROVIDER_SITE_OTHER): Payer: BC Managed Care – PPO

## 2012-08-30 ENCOUNTER — Encounter: Payer: Self-pay | Admitting: Pulmonary Disease

## 2012-08-30 ENCOUNTER — Ambulatory Visit (INDEPENDENT_AMBULATORY_CARE_PROVIDER_SITE_OTHER)
Admission: RE | Admit: 2012-08-30 | Discharge: 2012-08-30 | Disposition: A | Discharge status: Home / Self Care | Payer: BC Managed Care – PPO | Source: Ambulatory Visit | Attending: Pulmonary Disease | Admitting: Pulmonary Disease

## 2012-08-30 ENCOUNTER — Ambulatory Visit (INDEPENDENT_AMBULATORY_CARE_PROVIDER_SITE_OTHER): Payer: BC Managed Care – PPO | Admitting: Pulmonary Disease

## 2012-08-30 VITALS — BP 112/80 | HR 86 | Temp 98.4°F | Ht 63.0 in | Wt 153.8 lb

## 2012-08-30 DIAGNOSIS — R079 Chest pain, unspecified: Secondary | ICD-10-CM

## 2012-08-30 DIAGNOSIS — Z0000 Encounter for general adult medical examination without abnormal findings: Secondary | ICD-10-CM

## 2012-08-30 DIAGNOSIS — G4700 Insomnia, unspecified: Secondary | ICD-10-CM

## 2012-08-30 DIAGNOSIS — I10 Essential (primary) hypertension: Secondary | ICD-10-CM

## 2012-08-30 DIAGNOSIS — R51 Headache: Secondary | ICD-10-CM

## 2012-08-30 LAB — CBC WITH DIFFERENTIAL/PLATELET
Basophils Relative: 0.5 % (ref 0.0–3.0)
Eosinophils Absolute: 0 10*3/uL (ref 0.0–0.7)
Lymphocytes Relative: 32 % (ref 12.0–46.0)
MCHC: 32.9 g/dL (ref 30.0–36.0)
MCV: 90.3 fl (ref 78.0–100.0)
Monocytes Absolute: 0.4 10*3/uL (ref 0.1–1.0)
Neutrophils Relative %: 59.9 % (ref 43.0–77.0)
Platelets: 189 10*3/uL (ref 150.0–400.0)
RBC: 4.53 Mil/uL (ref 3.87–5.11)
WBC: 5.8 10*3/uL (ref 4.5–10.5)

## 2012-08-30 MED ORDER — ZOLPIDEM TARTRATE 10 MG PO TABS: 10.0000 mg | ORAL_TABLET | Freq: Every evening | ORAL | Status: DC | PRN

## 2012-08-30 MED ORDER — SUMATRIPTAN SUCCINATE 100 MG PO TABS: 100.0000 mg | ORAL_TABLET | Freq: Once | ORAL | Status: DC | PRN

## 2012-08-30 MED ORDER — METOPROLOL SUCCINATE ER 50 MG PO TB24: 50.0000 mg | ORAL_TABLET | Freq: Every day | ORAL | Status: DC

## 2012-08-30 MED ORDER — LISINOPRIL 20 MG PO TABS: 20.0000 mg | ORAL_TABLET | Freq: Every day | ORAL | Status: DC

## 2012-08-30 NOTE — Progress Notes (Signed)
Subjective:    Patient ID: Kelli Contreras, female    DOB: [birth year], 61 y.o.   MRN: [id number]  HPI 61 y/o WF here for a follow up visit...   ~  April 14, 2010:  she called requesting an add-on appt after 50yr hiatus due to HA... hx migraines w/ Eval & Rx from DrLewitt in the past> she c/o the co-pay for specialists & hasn't been back to see him... prev well controlled on Topamax 100mg /d, Imipramine 50mg - 2 tabs daily, and Imitrex 100mg  as needed (she ran out of the Topamax & Imitrex w/ onset of HA this week)... also has hx HBP & prev well controlled on Lisinopril 40mg /d + HCTZ 25mg /d... notes BP up when she is in pain w/ HA & not the other way around... also notes difficulty sleeping w/ hx chronic persisitant insomnia- ran out of Ambien & not resting at all... we discussed restarting her regular med regimen and scheduling a CPX w/ CXR, EKG, fasting labs for later this yr...  ~  May 23,2012:  Yearly ROV & she requests refill prescriptions... She hasn't had CPX- labs, Xray, EKG, etc- due to no insurance & requests that we don't do these important screening procedures at this time... 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  ~  August 30, 2012:  33mo ROV & Kelli Contreras presents c/o several month hx SS CP that appears to come on w/ activity & go away w/ rest; it's been present for several months & has recently incr frequency to ~1/wk or so; it comes on w/ activ, feels like a dull pressure in chest, radiates out to the arms, lasts <39min & resolves spont w/ rest; it is occas assoc w/ SOB  & she has had some dizziness but she denies palpit, syncope, edema...  As prev noted she has NEVER had a baseline CXR, EKG, Labs as she has not had insurance & refused CPX etc... Now she has insurance coverage through a new job & wants to get this evaluated... Known HBP,  nonsmoker, but we don't have baseline Lipids or BS...    Cricopharyngeus dysfunction>  Notes occas episodes of throat closing, can't breathe, can't get the air in, face feels numb; offered Klonopin Rx but she says rare episodes & wants to hold off...    CP>  Presented 10/13 w/ SSCP and abn EKG==> meds adjusted (decr Lisinopril & added ASA, MetopER), referred to Cards for further eval...    HBP>  Controlled on Lisin20Bid, Hct25; BP= 112/80, not checking at home; denies recent HA, palpit, SOB, edema...    HxDiarrhea>  On Questran but only uses this as needed & averages <1/month...    HxMigraines>  On Imipramine100, Topamax100Bid, Imitrex 100mg  prn; she has been out of the Imitrex for along time due to cost, using OTC analgesics as needed...    Chronic Insomnia>  On Ambien 10mg  Qhs as needed for sleep...    Anxiety>  Under a lot of stress- mother has moved in w/ her, no currently on an anxiolytic rx... We reviewed prob list, meds, xrays and labs> see below for updates >>  CXR 10/13 showed norm heart size, clear lungs, osteopenic appearing bones... EKG 10/13 showed NSR, rate 85, IVCD, poor R progression V1-3... LABS 10/13: (Non-fasting) Chems- wnl;  CBC- wnl;  TSH=0.76  Problem List:  HYPERTENSION (ICD-401.9) - controlled on LISINOPRIL 40mg /d, and HCTZ 25mg /d...  ~  5/12:  BP= 138/84> she tolerates meds well, takes regularly & home BP checks are OK when not in pain; denies fatigue, visual changes, CP, palipit, dizziness, syncope, dyspnea, edema, etc... ~  10/13:  BP= 112/80 on Lisin40 & Hct25; presented w/ CP/ SOB & we decided to decr Lisin20 & add MetopER50, refer to Cards...  CHEST PAIN >> ~  10/13:  See above - referred to Cards  Hx of DIARRHEA (ICD-787.91) - on QUESTRAN 4gm packet daily as needed... this really helps her prev chr diarrhea problem and she feels it has been a life-saver! ~  10/13:  She notes that she only uses the Questran about once per month now, doing very well w/o  recurrent diarrhea...  Hx of PLANTAR FASCIITIS (ICD-728.71) - Eval and Rx by DrBednarz in past... she uses Ibuprofen Prn.  Hx of HEADACHE (ICD-784.0) - eval by DrLewitt and treated w/ TOPAMAX 100mg /d, IMIPRAMINE 50mg - 2daily, & IMITREX 100mg Prn... she still gets HA's but feels that they are manageable w/ this regimen...  She requests refill Rx from [place] due to cost of seeing DrLewitt & the fact that she has been well controlled on a stable med regimen...  Hx of DEPRESSION (ICD-311) - prev on Lexapro perscribed by DrLomax... she feels that it helped but ran out & doing OK since then (her husb died of sudden cardiac death 2022/12/07)...  INSOMNIA, CHRONIC (ICD-307.42) - she has chronic persistant insomnia treated w/ ZOLPIDEM 10mg  Qhs...  We discussed alternative OTC Rx w/ Melatonin & TylenolPM.  Health Maintenance:  she had full labs from [place] in 2004, and from DrLomax in 2007 & all WNL.Marland Kitchen. ~  GYN= DrLomax w/ hysterectomy in 1985... on VIVELLE patch... he does BMD's w/ report of Osteopenia 8/07... ~  GI = DrPerry w/ colonoscopy 12/07 that was WNL... f/u planned 66yrs. ~  Immunizations:  she gets the yearly Flu vaccine.   Past Surgical History  Procedure Date  . Vesicovaginal fistula closure w/ tah 1985    Dr. Nicholas Lose  . Bunionectomy 2007    Dr. Lestine Box    Outpatient Encounter Prescriptions as of 08/30/2012  Medication Sig Dispense Refill  . cholestyramine (QUESTRAN) 4 G packet Take 1 packet by mouth daily as needed.  90 each  3  . hydrochlorothiazide (HYDRODIURIL) 25 MG tablet Take 1 tablet (25 mg total) by mouth daily.  90 tablet  3  . ibuprofen (ADVIL,MOTRIN) 200 MG tablet Take 200 mg by mouth every 6 (six) hours as needed.        Marland Kitchen imipramine (TOFRANIL) 50 MG tablet 2 tablets daily  60 tablet  0  . lisinopril (PRINIVIL,ZESTRIL) 20 MG tablet Take 1 tablet (20 mg total) by mouth 2 (two) times daily.  180 tablet  3  . SUMAtriptan (IMITREX) 100 MG tablet Take 1 tablet (100 mg total) by mouth once as  needed for migraine.  90 tablet  5  . topiramate (TOPAMAX) 100 MG tablet Take 1 tablet (100 mg total) by mouth 2 (two) times daily.  60 tablet  1  . zolpidem (AMBIEN) 10 MG tablet Take 1 tablet (10 mg total) by mouth at bedtime as needed.  90 tablet  3    Allergies not on file >> NKDA...   Review of Systems         See HPI - all other systems neg except as noted... The patient complains of headaches.  The  patient denies anorexia, fever, weight loss, weight gain, vision loss, decreased hearing, hoarseness, chest pain, syncope, dyspnea on exertion, peripheral edema, prolonged cough, hemoptysis, abdominal pain, melena, hematochezia, severe indigestion/heartburn, hematuria, incontinence, muscle weakness, suspicious skin lesions, transient blindness, difficulty walking, depression, unusual weight change, abnormal bleeding, enlarged lymph nodes, and angioedema.     Objective:   Physical Exam     WD, WN, 61 y/o WF in NAD... GENERAL:  Alert & oriented; pleasant & cooperative... HEENT:  /AT, EOM-wnl, PERRLA, EACs-clear, TMs-wnl, NOSE-clear, THROAT-clear & wnl. NECK:  Supple w/ full ROM; no JVD; normal carotid impulses w/o bruits; no thyromegaly or nodules palpated; no lymphadenopathy. CHEST:  Clear to P & A; without wheezes/ rales/ or rhonchi. HEART:  Regular Rhythm; without murmurs/ rubs/ or gallops. ABDOMEN:  Soft & nontender; normal bowel sounds; no organomegaly or masses detected. EXT: without deformities or arthritic changes; no varicose veins/ venous insuffic/ or edema. NEURO:  CN's intact; motor testing normal; sensory testing normal; gait normal & balance OK. DERM:  No lesions noted; no rash etc...  RADIOLOGY DATA:  Reviewed in the EPIC EMR & discussed w/ the patient...  LABORATORY DATA:  Reviewed in the EPIC EMR & discussed w/ the patient...   Assessment & Plan:   Chest Pain>  She has indeterminate CP w/ an abn EKG and we will eval risk factors & refer to Cards for further eval;  note that her husb died of sudden cardiac death in 2007/04/25; in the interim we will decr the Lisinopril40=>20 & add MetoprololER50mg /d...  HBP>  Well controlled on Lisinopril & HCT Rx, continue same...  Headaches>  Prev thorough eval by DrLewitt & she has been well controlled on regimen of Topamax, Imipramine, Imitrex & she requests refills from [place]...  Insomnia>  Stable on Ambien for her CPI problem...  Anxiety>  Mother has moved in, doesn't want anxiolytic Rx...   Patient's Medications  New Prescriptions   METOPROLOL SUCCINATE (TOPROL-XL) 50 MG 24 HR TABLET    Take 1 tablet (50 mg total) by mouth daily. Take with or immediately following a meal.  Previous Medications   CHOLESTYRAMINE (QUESTRAN) 4 G PACKET    Take 1 packet by mouth daily as needed.   HYDROCHLOROTHIAZIDE (HYDRODIURIL) 25 MG TABLET    Take 1 tablet (25 mg total) by mouth daily.   IBUPROFEN (ADVIL,MOTRIN) 200 MG TABLET    Take 200 mg by mouth every 6 (six) hours as needed.     IMIPRAMINE (TOFRANIL) 50 MG TABLET    2 tablets daily   TOPIRAMATE (TOPAMAX) 100 MG TABLET    Take 1 tablet (100 mg total) by mouth 2 (two) times daily.  Modified Medications   Modified Medication Previous Medication   LISINOPRIL (PRINIVIL,ZESTRIL) 20 MG TABLET lisinopril (PRINIVIL,ZESTRIL) 20 MG tablet      Take 1 tablet (20 mg total) by mouth daily.    Take 1 tablet (20 mg total) by mouth 2 (two) times daily.   SUMATRIPTAN (IMITREX) 100 MG TABLET SUMAtriptan (IMITREX) 100 MG tablet      Take 1 tablet (100 mg total) by mouth once as needed for migraine.    Take 1 tablet (100 mg total) by mouth once as needed for migraine.   ZOLPIDEM (AMBIEN) 10 MG TABLET zolpidem (AMBIEN) 10 MG tablet      Take 1 tablet (10 mg total) by mouth at bedtime as needed.    Take 1 tablet (10 mg total) by mouth at bedtime as needed.  Discontinued Medications  LISINOPRIL (PRINIVIL,ZESTRIL) 20 MG TABLET    Take 20 mg by mouth daily.

## 2012-08-30 NOTE — Patient Instructions (Addendum)
Today we updated your med list in our EPIC system...  Today we did your baseline CXR, EKG, & non-fasting blood work...     We decided to decr the Lisinopril 20mg  tabs to one daily, and add METOPROLOL-ER 50mg  one daily...  Continue your other meds the same & we wrote refill prescriptions as requested...  We will set up an appt w/ Cardiology for further eval (I would anticipate an Echo & probably a treadmill for a thorough assessment)...  Call for any questions...  Let's plan a follow up visit to check your progress in 3-4 months, sooner if needed.Marland KitchenMarland Kitchen

## 2012-08-31 LAB — BASIC METABOLIC PANEL
CO2: 30 mEq/L (ref 19–32)
Chloride: 106 mEq/L (ref 96–112)
GFR: 73.79 mL/min (ref >60.00)

## 2012-08-31 LAB — HEPATIC FUNCTION PANEL
ALT: 15 U/L (ref 0–35)
AST: 22 U/L (ref 0–37)
Bilirubin, Direct: 0.1 mg/dL (ref 0.0–0.3)
Total Bilirubin: 0.4 mg/dL (ref 0.3–1.2)
Total Protein: 7.2 g/dL (ref 6.0–8.3)

## 2012-08-31 MED ORDER — LISINOPRIL 20 MG PO TABS: 20.0000 mg | ORAL_TABLET | Freq: Every day | ORAL | Status: DC

## 2012-08-31 MED ORDER — ZOLPIDEM TARTRATE 10 MG PO TABS: 10.0000 mg | ORAL_TABLET | Freq: Every evening | ORAL | Status: DC | PRN

## 2012-09-01 ENCOUNTER — Telehealth: Payer: Self-pay | Admitting: Pulmonary Disease

## 2012-09-01 LAB — TSH: TSH: 0.76 u[IU]/mL (ref 0.35–5.50)

## 2012-09-01 MED ORDER — IMIPRAMINE HCL 50 MG PO TABS: ORAL_TABLET | ORAL | Status: DC

## 2012-09-01 NOTE — Telephone Encounter (Signed)
Refill has been sent in for the pt and i have called and she is aware. Nothing further is needed.

## 2012-09-06 NOTE — Progress Notes (Signed)
Quick Note:  Patient returned call. Advised of lab results / recs as stated by SN. Pt verbalized understanding and denied any questions. ______ 

## 2012-09-14 ENCOUNTER — Telehealth: Payer: Self-pay | Admitting: Pulmonary Disease

## 2012-09-14 MED ORDER — CIPROFLOXACIN HCL 250 MG PO TABS: 250.0000 mg | ORAL_TABLET | Freq: Two times a day (BID) | ORAL | Status: DC

## 2012-09-14 NOTE — Telephone Encounter (Signed)
Called and spoke with pt and she is aware of  SN recs to start on cipro 250 mg  #14  1 po bid and pt is aware that this has been sent to [facility] in [place].  Pt voiced her understanding and nothing further is needed.

## 2012-09-14 NOTE — Telephone Encounter (Signed)
Called, spoke with pt - believes she has a bladder infection.  Reports frequency and urgency urinating with only a small amount each time, has pressure and and odor to urine.  Symptoms started x 1 wk ago with dysuria starting over the past day.  No f/c/s.  Has increased the amount of water and cranberry juice with no relief.  Requesting further recs.  Dr. Kriste Basque, pls advise.  Thank you.  Last OV with Dr. Kriste Basque on 08/30/12  [facility] [place]  nkda - verified

## 2012-09-14 NOTE — Telephone Encounter (Signed)
Pt called back again

## 2012-09-28 ENCOUNTER — Ambulatory Visit (INDEPENDENT_AMBULATORY_CARE_PROVIDER_SITE_OTHER): Payer: BC Managed Care – PPO | Admitting: Cardiology

## 2012-09-28 ENCOUNTER — Encounter: Payer: Self-pay | Admitting: Cardiology

## 2012-09-28 VITALS — BP 122/86 | HR 77 | Ht 63.0 in | Wt 155.0 lb

## 2012-09-28 DIAGNOSIS — R072 Precordial pain: Secondary | ICD-10-CM | POA: Insufficient documentation

## 2012-09-28 DIAGNOSIS — I10 Essential (primary) hypertension: Secondary | ICD-10-CM

## 2012-09-28 DIAGNOSIS — R079 Chest pain, unspecified: Secondary | ICD-10-CM

## 2012-09-28 NOTE — Progress Notes (Signed)
HPI The patient presents for evaluation of chest discomfort. She's had a distant cardiac catheterization with no coronary disease. She reports having chest discomfort for the last 90 days or so. 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 She says while she is running between classes she will get some chest discomfort that is sharp. It can be out 8 out of 10 in intensity. She has gotten short of breath with this.   She does not describe any radiation to her jaw or to her arms. She does not describe associated nausea vomiting or diaphoresis. It may last for up to 30 minutes before it goes away. It is not always reproducible with activity and she has never had it at rest. This is the most exerting activity she does. She does not describe PND or orthopnea. She does not have palpitations, presyncope or syncope.   No Known Allergies  Current Outpatient Prescriptions  Medication Sig Dispense Refill  . aspirin 81 MG tablet Take 81 mg by mouth daily.      . cholestyramine (QUESTRAN) 4 G packet Take 1 packet by mouth daily as needed.  90 each  3  . ciprofloxacin (CIPRO) 250 MG tablet Take 1 tablet (250 mg total) by mouth 2 (two) times daily.  14 tablet  0  . hydrochlorothiazide (HYDRODIURIL) 25 MG tablet Take 1 tablet (25 mg total) by mouth daily.  90 tablet  3  . ibuprofen (ADVIL,MOTRIN) 200 MG tablet Take 200 mg by mouth every 6 (six) hours as needed.        Marland Kitchen imipramine (TOFRANIL) 50 MG tablet 2 tablets daily  180 tablet  3  . lisinopril (PRINIVIL,ZESTRIL) 20 MG tablet Take 1 tablet (20 mg total) by mouth daily.  90 tablet  3  . metoprolol succinate (TOPROL-XL) 50 MG 24 hr tablet Take 1 tablet (50 mg total) by mouth daily. Take with or immediately following a meal.  90 tablet  3  . SUMAtriptan (IMITREX) 100 MG tablet Take 1 tablet (100 mg total) by mouth once as needed for migraine.  30 tablet  5  . topiramate (TOPAMAX) 100 MG tablet Take 1  tablet (100 mg total) by mouth 2 (two) times daily.  60 tablet  1  . zolpidem (AMBIEN) 10 MG tablet Take 1 tablet (10 mg total) by mouth at bedtime as needed.  90 tablet  1    Past Medical History  Diagnosis Date  . Hypertension   . Diarrhea   . Plantar fasciitis   . Depression   . Insomnia     Past Surgical History  Procedure Date  . Vesicovaginal fistula closure w/ tah 1985    Dr. Nicholas Lose  . Bunionectomy 2007    Dr. Lestine Box    Family History  Problem Relation Age of Onset  . Hypertension      mother, father, brother  . CAD Father 23  . Valvular heart disease Mother     MVP    History   Social History  . Marital Status: Widowed    Spouse Name: N/A    Number of Children: 2  . Years of Education: N/A   Occupational History  . Not on file.   Social History Main Topics  . Smoking status: Never Smoker   . Smokeless tobacco: Never Used  . Alcohol Use: No  . Drug Use: No  . Sexually Active: Not on file  Other Topics Concern  . Not on file   Social History Narrative   One grandchild.   Data processing manager.    ROS:  Positive for migraines, joint pain.  Otherwise as stated in the HPI and negative for all other systems.   PHYSICAL EXAM BP 122/86  Pulse 77  Ht 5\' 3"  (1.6 m)  Wt 155 lb (70.308 kg)  BMI 27.46 kg/m2  SpO2 98% GENERAL:  Well appearing HEENT:  Pupils equal round and reactive, fundi not visualized, oral mucosa unremarkable NECK:  No jugular venous distention, waveform within normal limits, carotid upstroke brisk and symmetric, no bruits, no thyromegaly LYMPHATICS:  No cervical, inguinal adenopathy LUNGS:  Clear to auscultation bilaterally BACK:  No CVA tenderness CHEST:  Unremarkable HEART:  PMI not displaced or sustained,S1 and S2 within normal limits, no S3, no S4, no clicks, no rubs, no murmurs ABD:  Flat, positive bowel sounds normal in frequency in pitch, no bruits, no rebound, no guarding, no midline pulsatile mass, no hepatomegaly, no  splenomegaly EXT:  2 plus pulses throughout, no edema, no cyanosis no clubbing SKIN:  No rashes no nodules NEURO:  Cranial nerves II through XII grossly intact, motor grossly intact throughout PSYCH:  Cognitively intact, oriented to person place and time  EKG:  Sinus rhythm, rate 72, axis within normal limits, intervals within normal limits, no acute ST-T wave changes.  09/28/2012  ASSESSMENT AND PLAN  PRECORDIAL CHEST PAIN - The patient does have symptoms that are suggestive of possible obstructive coronary disease. I think the pretest probability of coronary disease is at least moderately high. Exercise treadmill testing would be in adequate for evaluation because of the low sensitivity. Therefore, stress perfusion imaging is indicated. Further evaluation will be based on these results.  Hypertension - Her blood pressure is well-controlled on the medications as listed and she will continue with these.  Risk reduction - We did talk about exercise. I will also check a lipid profile when she returns for her stress test. The goal will be an LDL less than 100.

## 2012-09-28 NOTE — Patient Instructions (Addendum)
The current medical regimen is effective;  continue present plan and medications.  Your physician has requested that you have a lexiscan myoview. For further information please visit [email or web address]. Please follow instruction sheet, as given.  Follow up in 1 month with Dr Hochrein. 

## 2012-10-11 ENCOUNTER — Ambulatory Visit (HOSPITAL_COMMUNITY): Payer: BC Managed Care – PPO | Attending: Cardiology | Admitting: Radiology

## 2012-10-11 VITALS — BP 136/101 | Ht 63.0 in | Wt 153.0 lb

## 2012-10-11 DIAGNOSIS — R0602 Shortness of breath: Secondary | ICD-10-CM

## 2012-10-11 DIAGNOSIS — Z8249 Family history of ischemic heart disease and other diseases of the circulatory system: Secondary | ICD-10-CM | POA: Insufficient documentation

## 2012-10-11 DIAGNOSIS — R079 Chest pain, unspecified: Secondary | ICD-10-CM | POA: Insufficient documentation

## 2012-10-11 DIAGNOSIS — R0609 Other forms of dyspnea: Secondary | ICD-10-CM | POA: Insufficient documentation

## 2012-10-11 DIAGNOSIS — I10 Essential (primary) hypertension: Secondary | ICD-10-CM | POA: Insufficient documentation

## 2012-10-11 DIAGNOSIS — R0989 Other specified symptoms and signs involving the circulatory and respiratory systems: Secondary | ICD-10-CM | POA: Insufficient documentation

## 2012-10-11 MED ORDER — AMINOPHYLLINE 25 MG/ML IV SOLN
150.0000 mg | Freq: Once | INTRAVENOUS | Status: AC
  Administered 2012-10-11: 150 mg via INTRAVENOUS

## 2012-10-11 MED ORDER — TECHNETIUM TC 99M SESTAMIBI GENERIC - CARDIOLITE
32.9000 | Freq: Once | INTRAVENOUS | Status: AC | PRN
  Administered 2012-10-11: 32.9 via INTRAVENOUS

## 2012-10-11 MED ORDER — REGADENOSON 0.4 MG/5ML IV SOLN
0.4000 mg | Freq: Once | INTRAVENOUS | Status: AC
  Administered 2012-10-11: 0.4 mg via INTRAVENOUS

## 2012-10-11 MED ORDER — TECHNETIUM TC 99M SESTAMIBI GENERIC - CARDIOLITE
11.0000 | Freq: Once | INTRAVENOUS | Status: AC | PRN
  Administered 2012-10-11: 11 via INTRAVENOUS

## 2012-10-11 NOTE — Progress Notes (Signed)
[facility] SITE 3 NUCLEAR MED [address] [id number] [place] [address] [phone]  Cardiology Nuclear Med Study  Kelli Contreras is a 61 y.o. female     MRN : [id number]     DOB: Oct 01, 1951  Procedure Date: 10/11/2012  Nuclear Med Background Indication for Stress Test:  Evaluation for Ischemia History:  Heart Cath: No CAD No date Cardiac Risk Factors: Family History - CAD and Hypertension  Symptoms:  Chest Pain, DOE, Fatigue, Light-Headedness and SOB   Nuclear Pre-Procedure Caffeine/Decaff Intake:  None NPO After: 7:30am   Lungs:  clear O2 Sat: 99% on room air. IV 0.9% NS with Angio Cath:  22g  IV Site: R Antecubital  IV Started by:  Cathlyn Parsons, RN  Chest Size (in):  36 Cup Size: C  Height: 5\' 3"  (1.6 m)  Weight:  153 lb (69.4 kg)  BMI:  Body mass index is 27.10 kg/(m^2). Tech Comments:  Toprol held x 36 hrs.    Nuclear Med Study 1 or 2 day study: 1 day  Stress Test Type:  Treadmill/Lexiscan  Reading MD: Charlton Haws, MD  Order Authorizing Provider:  Melany Guernsey  Resting Radionuclide: Technetium 67m Sestamibi  Resting Radionuclide Dose: 11.0 mCi   Stress Radionuclide:  Technetium 84m Sestamibi  Stress Radionuclide Dose: 32.9 mCi           Stress Protocol Rest HR: 80 Stress HR: 111  Rest BP: 136/101 Stress BP: 159/101  Exercise Time (min): n/a METS: n/a   Predicted Max HR: 162 bpm % Max HR: 68.52 bpm Rate Pressure Product: 62952   Dose of Adenosine (mg):  n/a Dose of Lexiscan: 0.4 mg  Dose of Atropine (mg): n/a Dose of Dobutamine: n/a mcg/kg/min (at max HR)  Stress Test Technologist: Milana Na, EMT-P  Nuclear Technologist:  Domenic Polite, CNMT     Rest Procedure:  Myocardial perfusion imaging was performed at rest 45 minutes following the intravenous administration of Technetium 39m Sestamibi. Rest ECG: NSR - Normal EKG  Stress Procedure:  The patient received IV Lexiscan 0.4 mg over 15-seconds with  concurrent low level exercise and then Technetium 98m Sestamibi was injected at 30-seconds while the patient continued walking one more minute. There were no significant changes, + sob, headache, and chest discomfort with Lexiscan. This patient had to be reversed with Aminophylline 150 mg IV her headache wouldn't go away. After the Aminophylline, she was symptom free. Quantitative spect images were obtained after a 45-minute delay. Stress ECG: No significant change from baseline ECG  QPS Raw Data Images:  Normal; no motion artifact; normal heart/lung ratio. Stress Images:  Normal homogeneous uptake in all areas of the myocardium. Rest Images:  Normal homogeneous uptake in all areas of the myocardium. Subtraction (SDS):  Normal Transient Ischemic Dilatation (Normal <1.22):  1.09 Lung/Heart Ratio (Normal <0.45):  0.32  Quantitative Gated Spect Images QGS EDV:  67 ml QGS ESV:  20 ml  Impression Exercise Capacity:  Lexiscan with low level exercise. BP Response:  Normal blood pressure response. Clinical Symptoms:  There is dyspnea. ECG Impression:  No significant ST segment change suggestive of ischemia. Comparison with Prior Nuclear Study: No images to compare  Overall Impression:  Normal stress nuclear study.  LV Ejection Fraction: 70%.  LV Wall Motion:  NL LV Function; NL Wall Motion   Charlton Haws

## 2012-10-14 ENCOUNTER — Telehealth: Payer: Self-pay | Admitting: Pulmonary Disease

## 2012-10-14 MED ORDER — TOPIRAMATE 100 MG PO TABS: 100.0000 mg | ORAL_TABLET | Freq: Two times a day (BID) | ORAL | Status: DC

## 2012-10-14 NOTE — Telephone Encounter (Signed)
rx for the topamax has been sent to [facility] and the pt is aware. Nothing further is needed.

## 2012-12-13 ENCOUNTER — Other Ambulatory Visit: Payer: Self-pay | Admitting: Pulmonary Disease

## 2012-12-24 ENCOUNTER — Other Ambulatory Visit: Payer: Self-pay

## 2013-01-24 ENCOUNTER — Telehealth: Payer: Self-pay | Admitting: Pulmonary Disease

## 2013-01-24 NOTE — Telephone Encounter (Signed)
lmomtcb x1 for pt 

## 2013-01-24 NOTE — Telephone Encounter (Signed)
I spoke with pt. 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 She is needing to come in on a late afternoon. Please advise SN thanks Last OV 08/30/12 No pending appt

## 2013-01-25 NOTE — Telephone Encounter (Signed)
I spoke with pt and is scheduled to come in for TB skin test. Pt was placed on allergy scheduled for this. Nothing further was needed

## 2013-01-25 NOTE — Telephone Encounter (Signed)
Per SN----ok for pt to come in for the tb skin test.  thanks

## 2013-01-25 NOTE — Telephone Encounter (Signed)
lmomtcb for pt 

## 2013-02-20 ENCOUNTER — Ambulatory Visit: Payer: BC Managed Care – PPO

## 2013-06-08 ENCOUNTER — Telehealth: Payer: Self-pay | Admitting: Pulmonary Disease

## 2013-06-08 NOTE — Telephone Encounter (Signed)
Pt ambien last refilled 08/31/13 #90 x 1 refill.  Last OV 08/30/12 No pending appt Please advise SN thanks

## 2013-06-09 MED ORDER — ZOLPIDEM TARTRATE 10 MG PO TABS: 10.0000 mg | ORAL_TABLET | Freq: Every evening | ORAL | Status: DC | PRN

## 2013-06-09 NOTE — Telephone Encounter (Signed)
Called, spoke with pt.  Kelli Contreras has faxed rx to [facility].  Informed her of this and below.  She verbalized understanding and voiced no further questions or concerns at this time.

## 2013-06-09 NOTE — Telephone Encounter (Signed)
Per SN---  SN can only write for the #90 with 1 refill.  She will need to check with [facility] and make sure they can fill this for her for the 6 month supply. thanks

## 2013-08-08 ENCOUNTER — Other Ambulatory Visit: Payer: Self-pay | Admitting: Pulmonary Disease

## 2013-09-14 ENCOUNTER — Other Ambulatory Visit: Payer: Self-pay

## 2013-10-04 ENCOUNTER — Other Ambulatory Visit: Payer: Self-pay | Admitting: Pulmonary Disease

## 2013-10-10 ENCOUNTER — Other Ambulatory Visit: Payer: Self-pay | Admitting: Pulmonary Disease

## 2013-10-18 ENCOUNTER — Telehealth: Payer: Self-pay | Admitting: Pulmonary Disease

## 2013-10-18 ENCOUNTER — Other Ambulatory Visit: Payer: Self-pay | Admitting: Pulmonary Disease

## 2013-10-18 MED ORDER — HYDROCHLOROTHIAZIDE 25 MG PO TABS: ORAL_TABLET | ORAL | Status: DC

## 2013-10-18 MED ORDER — TOPIRAMATE 100 MG PO TABS: 100.0000 mg | ORAL_TABLET | Freq: Two times a day (BID) | ORAL | Status: DC

## 2013-10-18 NOTE — Telephone Encounter (Signed)
Pt aware RX's have been refilled. Nothing further needed

## 2013-10-18 NOTE — Telephone Encounter (Signed)
I called pt. She reports she [facility]. I advised will do so. I called [facility] and cancelled RX's sent to them earlier. Nothing further needed

## 2013-11-12 ENCOUNTER — Other Ambulatory Visit: Payer: Self-pay | Admitting: Pulmonary Disease

## 2013-11-17 ENCOUNTER — Other Ambulatory Visit: Payer: Self-pay | Admitting: Pulmonary Disease

## 2013-11-22 ENCOUNTER — Telehealth: Payer: Self-pay | Admitting: Pulmonary Disease

## 2013-11-22 MED ORDER — IMIPRAMINE HCL 50 MG PO TABS: ORAL_TABLET | ORAL | Status: DC

## 2013-11-22 NOTE — Telephone Encounter (Signed)
Called and spoke with pt and she is aware that she will need appt for refills.  i scheduled appt for pt on 4/3 and refills have been sent to the pharmacy.  Pt is aware of SN retiring from primary care and she will think about who she would like to see and discuss at her next ov.

## 2014-01-12 ENCOUNTER — Other Ambulatory Visit: Payer: Self-pay | Admitting: Pulmonary Disease

## 2014-01-12 DIAGNOSIS — I10 Essential (primary) hypertension: Secondary | ICD-10-CM

## 2014-01-25 ENCOUNTER — Telehealth: Payer: Self-pay | Admitting: Pulmonary Disease

## 2014-01-25 NOTE — Telephone Encounter (Signed)
Error

## 2014-01-26 ENCOUNTER — Encounter: Payer: Self-pay | Admitting: Pulmonary Disease

## 2014-01-30 ENCOUNTER — Emergency Department (HOSPITAL_COMMUNITY): Payer: BC Managed Care – PPO

## 2014-01-30 ENCOUNTER — Emergency Department (HOSPITAL_COMMUNITY)
Admission: EM | Admit: 2014-01-30 | Discharge: 2014-01-30 | Disposition: A | Discharge status: Home / Self Care | Payer: BC Managed Care – PPO | Attending: Emergency Medicine | Admitting: Emergency Medicine

## 2014-01-30 ENCOUNTER — Ambulatory Visit (INDEPENDENT_AMBULATORY_CARE_PROVIDER_SITE_OTHER): Payer: BC Managed Care – PPO | Admitting: Adult Health

## 2014-01-30 ENCOUNTER — Encounter: Payer: Self-pay | Admitting: Adult Health

## 2014-01-30 VITALS — BP 78/60 | HR 111 | Temp 97.5°F | Ht 62.0 in | Wt 155.6 lb

## 2014-01-30 DIAGNOSIS — J029 Acute pharyngitis, unspecified: Secondary | ICD-10-CM | POA: Insufficient documentation

## 2014-01-30 DIAGNOSIS — G4700 Insomnia, unspecified: Secondary | ICD-10-CM | POA: Insufficient documentation

## 2014-01-30 DIAGNOSIS — F3289 Other specified depressive episodes: Secondary | ICD-10-CM | POA: Insufficient documentation

## 2014-01-30 DIAGNOSIS — I959 Hypotension, unspecified: Secondary | ICD-10-CM

## 2014-01-30 DIAGNOSIS — Z8739 Personal history of other diseases of the musculoskeletal system and connective tissue: Secondary | ICD-10-CM | POA: Insufficient documentation

## 2014-01-30 DIAGNOSIS — R0602 Shortness of breath: Secondary | ICD-10-CM | POA: Insufficient documentation

## 2014-01-30 DIAGNOSIS — R059 Cough, unspecified: Secondary | ICD-10-CM | POA: Insufficient documentation

## 2014-01-30 DIAGNOSIS — R000 Tachycardia, unspecified: Secondary | ICD-10-CM | POA: Insufficient documentation

## 2014-01-30 DIAGNOSIS — I10 Essential (primary) hypertension: Secondary | ICD-10-CM | POA: Insufficient documentation

## 2014-01-30 DIAGNOSIS — R05 Cough: Secondary | ICD-10-CM | POA: Insufficient documentation

## 2014-01-30 DIAGNOSIS — F329 Major depressive disorder, single episode, unspecified: Secondary | ICD-10-CM | POA: Insufficient documentation

## 2014-01-30 DIAGNOSIS — Z79899 Other long term (current) drug therapy: Secondary | ICD-10-CM | POA: Insufficient documentation

## 2014-01-30 DIAGNOSIS — Z7982 Long term (current) use of aspirin: Secondary | ICD-10-CM | POA: Insufficient documentation

## 2014-01-30 DIAGNOSIS — R52 Pain, unspecified: Secondary | ICD-10-CM | POA: Insufficient documentation

## 2014-01-30 LAB — CBC WITH DIFFERENTIAL/PLATELET
BASOS ABS: 0 10*3/uL (ref 0.0–0.1)
Basophils Relative: 0 % (ref 0–1)
Eosinophils Absolute: 0 10*3/uL (ref 0.0–0.7)
Eosinophils Relative: 0 % (ref 0–5)
HCT: 41.6 % (ref 36.0–46.0)
Hemoglobin: 14.1 g/dL (ref 12.0–15.0)
LYMPHS ABS: 0.6 10*3/uL — AB (ref 0.7–4.0)
LYMPHS PCT: 14 % (ref 12–46)
MCH: 29.5 pg (ref 26.0–34.0)
MCHC: 33.9 g/dL (ref 30.0–36.0)
MCV: 87 fL (ref 78.0–100.0)
MONO ABS: 0.5 10*3/uL (ref 0.1–1.0)
Monocytes Relative: 12 % (ref 3–12)
NEUTROS ABS: 3.1 10*3/uL (ref 1.7–7.7)
Neutrophils Relative %: 74 % (ref 43–77)
Platelets: 125 10*3/uL — ABNORMAL LOW (ref 150–400)
RBC: 4.78 MIL/uL (ref 3.87–5.11)
RDW: 13.4 % (ref 11.5–15.5)
WBC: 4.2 10*3/uL (ref 4.0–10.5)

## 2014-01-30 LAB — I-STAT CG4 LACTIC ACID, ED: Lactic Acid, Venous: 1.98 mmol/L (ref 0.5–2.2)

## 2014-01-30 LAB — URINALYSIS, ROUTINE W REFLEX MICROSCOPIC
BILIRUBIN URINE: NEGATIVE
Glucose, UA: NEGATIVE mg/dL
HGB URINE DIPSTICK: NEGATIVE
KETONES UR: NEGATIVE mg/dL
NITRITE: NEGATIVE
PH: 5.5 (ref 5.0–8.0)
Protein, ur: NEGATIVE mg/dL
Specific Gravity, Urine: 1.011 (ref 1.005–1.030)
Urobilinogen, UA: 0.2 mg/dL (ref 0.0–1.0)

## 2014-01-30 LAB — URINE MICROSCOPIC-ADD ON

## 2014-01-30 LAB — BASIC METABOLIC PANEL
BUN: 13 mg/dL (ref 6–23)
CALCIUM: 9.2 mg/dL (ref 8.4–10.5)
CHLORIDE: 101 meq/L (ref 96–112)
CO2: 27 meq/L (ref 19–32)
Creatinine, Ser: 1.29 mg/dL — ABNORMAL HIGH (ref 0.50–1.10)
GFR calc Af Amer: 51 mL/min — ABNORMAL LOW (ref >90)
GFR calc non Af Amer: 44 mL/min — ABNORMAL LOW (ref >90)
GLUCOSE: 108 mg/dL — AB (ref 70–99)
Potassium: 3.5 mEq/L — ABNORMAL LOW (ref 3.7–5.3)
Sodium: 140 mEq/L (ref 137–147)

## 2014-01-30 LAB — I-STAT TROPONIN, ED: Troponin i, poc: 0.01 ng/mL (ref 0.00–0.08)

## 2014-01-30 MED ORDER — SODIUM CHLORIDE 0.9 % IV BOLUS (SEPSIS)
1000.0000 mL | Freq: Once | INTRAVENOUS | Status: AC
  Administered 2014-01-30: 1000 mL via INTRAVENOUS

## 2014-01-30 NOTE — ED Notes (Signed)
Patient is aware that we need a urine specimen.  

## 2014-01-30 NOTE — Discharge Instructions (Signed)
Please stop taking all of your blood pressure medications until you see your primary care physician. Please also stop taking ambien and topamax as these medications can cause hypotension.   Hypotension As your heart beats, it forces blood through your arteries. This force is your blood pressure. If your blood pressure is too low for you to go about your normal activities or to support the organs of your body, you have hypotension. Hypotension is also referred to as low blood pressure. When your blood pressure becomes too low, you may not get enough blood to your brain. As a result, you may feel weak, feel lightheaded, or develop a rapid heart rate. In a more severe case, you may faint. CAUSES Various conditions can cause hypotension. These include:  Blood loss.  Dehydration.  Heart or endocrine problems.  Pregnancy.  Severe infection.  Not having a well-balanced diet filled with needed nutrients.  Severe allergic reactions (anaphylaxis). Some medicines, such as blood pressure medicine or water pills (diuretics), may lower your blood pressure below normal. Sometimes taking too much medicine or taking medicine not as directed can cause hypotension. TREATMENT  Hospitalization is sometimes required for hypotension if fluid or blood replacement is needed, if time is needed for medicines to wear off, or if further monitoring is needed. Treatment might include changing your diet, changing your medicines (including medicines aimed at raising your blood pressure), and use of support stockings. HOME CARE INSTRUCTIONS   Drink enough fluids to keep your urine clear or pale yellow.  Take your medicines as directed by your health care provider.  Get up slowly from reclining or sitting positions. This gives your blood pressure a chance to adjust.  Wear support stockings as directed by your health care provider.  Maintain a healthy diet by including nutritious food, such as fruits, vegetables, nuts,  whole grains, and lean meats. SEEK MEDICAL CARE IF:  You have vomiting or diarrhea.  You have a fever for more than 2 3 days.  You feel more thirsty than usual.  You feel weak and tired. SEEK IMMEDIATE MEDICAL CARE IF:   You have chest pain or a fast or irregular heartbeat.  You have a loss of feeling in some part of your body, or you lose movement in your arms or legs.  You have trouble speaking.  You become sweaty or feel lightheaded.  You faint. MAKE SURE YOU:   Understand these instructions.  Will watch your condition.  Will get help right away if you are not doing well or get worse. Document Released: 10/26/2005 Document Revised: 08/16/2013 Document Reviewed: 04/28/2013 [facility] Patient Information 2014 [place], [place].   Upper Respiratory Infection, Adult An upper respiratory infection (URI) is also known as the common cold. It is often caused by a type of germ (virus). Colds are easily spread (contagious). You can pass it to others by kissing, coughing, sneezing, or drinking out of the same glass. Usually, you get better in 1 or 2 weeks.  HOME CARE   Only take medicine as told by your doctor.  Use a warm mist humidifier or breathe in steam from a hot shower.  Drink enough water and fluids to keep your pee (urine) clear or pale yellow.  Get plenty of rest.  Return to work when your temperature is back to normal or as told by your doctor. You may use a face mask and wash your hands to stop your cold from spreading. GET HELP RIGHT AWAY IF:   After the first  few days, you feel you are getting worse.  You have questions about your medicine.  You have chills, shortness of breath, or brown or red spit (mucus).  You have yellow or brown snot (nasal discharge) or pain in the face, especially when you bend forward.  You have a fever, puffy (swollen) neck, pain when you swallow, or white spots in the back of your throat.  You have a bad headache, ear pain, sinus  pain, or chest pain.  You have a high-pitched whistling sound when you breathe in and out (wheezing).  You have a lasting cough or cough up blood.  You have sore muscles or a stiff neck. MAKE SURE YOU:   Understand these instructions.  Will watch your condition.  Will get help right away if you are not doing well or get worse. Document Released: 04/13/2008 Document Revised: 01/18/2012 Document Reviewed: 03/02/2011 [facility] Patient Information 2014 [place], [place].

## 2014-01-30 NOTE — ED Notes (Signed)
Bed: WA13 Expected date:  Expected time:  Means of arrival:  Comments: EMS hypotension  

## 2014-01-30 NOTE — ED Notes (Signed)
Patient walked to restroom, was only able to give a few drops of urine, not enough for a sample.

## 2014-01-30 NOTE — Assessment & Plan Note (Signed)
Symptomatic Hypotension with viral illness  Case discussed /reviewed with Dr. Kriste BasqueNadel    Plan  Will need transfer to ER for further evaluation and consideration of IV fluids for possible dehydration/volume depletion (on HTN rx )  EMS transport as pt not able to drive with dizziness/hypotension

## 2014-01-30 NOTE — ED Notes (Signed)
Per EMS-patient has had cold symptoms for three days-was at PCP's office this am-patient hypotensive and dizzy while standing-took BP meds last night

## 2014-01-30 NOTE — ED Notes (Signed)
Patient ambulated to restroom with standby assistance by this tech. Patient ambulated to restroom 1(was occupied) then ambulated to restroom 3(by room 20) with only standby assistance. RN Jari Favrescar notified.

## 2014-01-30 NOTE — ED Notes (Signed)
Patient given Ginger Ale per MD. Will reassess and see if patient can provide specimen

## 2014-01-30 NOTE — Progress Notes (Signed)
Subjective:    Patient ID: Kelli Contreras, female    DOB: [birth year], 63 y.o.   MRN: [id number]  HPI 63  y/o WF here for a follow up visit...   ~  April 14, 2010:  she called requesting an add-on appt after 4596yr hiatus due to HA... hx migraines w/ Eval & Rx from DrLewitt in the past> she c/o the co-pay for specialists & hasn't been back to see him... prev well controlled on Topamax 100mg /d, Imipramine 50mg - 2 tabs daily, and Imitrex 100mg  as needed (she ran out of the Topamax & Imitrex w/ onset of HA this week)... also has hx HBP & prev well controlled on Lisinopril 40mg /d + HCTZ 25mg /d... notes BP up when she is in pain w/ HA & not the other way around... also notes difficulty sleeping w/ hx chronic persisitant insomnia- ran out of Ambien & not resting at all... we discussed restarting her regular med regimen and scheduling a CPX w/ CXR, EKG, fasting labs for later this yr...  ~  May 23,2012:  Yearly ROV & she requests refill prescriptions... She hasn't had CPX- labs, Xray, EKG, etc- due to no insurance & requests that we don't do these important screening procedures at this time... She states BP controlled on meds;  HAs doing satis on her maintenance HA prescriptions;  Still needs the Ambien for her chronic persistant insomnia, but at least it works well & is Tree surgeontol satis...  ~  August 30, 2012:  52mo ROV & Kelli Contreras presents c/o several month hx SS CP that appears to come on w/ activity & go away w/ rest; it's been present for several months & has recently incr frequency to ~1/wk or so; it comes on w/ activ, feels like a dull pressure in chest, radiates out to the arms, lasts <735min & resolves spont w/ rest; it is occas assoc w/ SOB  & she has had some dizziness but she denies palpit, syncope, edema...  As prev noted she has NEVER had a baseline CXR, EKG, Labs as she has not had insurance & refused CPX etc... Now she has insurance coverage through a new job & wants to get this evaluated... Known HBP,  nonsmoker, but we don't have baseline Lipids or BS...    Cricopharyngeus dysfunction>  Notes occas episodes of throat closing, can't breathe, can't get the air in, face feels numb; offered Klonopin Rx but she says rare episodes & wants to hold off...    CP>  Presented 10/13 w/ SSCP and abn EKG==> meds adjusted (decr Lisinopril & added ASA, MetopER), referred to Cards for further eval...    HBP>  Controlled on Lisin20Bid, Hct25; BP= 112/80, not checking at home; denies recent HA, palpit, SOB, edema...    HxDiarrhea>  On Questran but only uses this as needed & averages <1/month...    HxMigraines>  On Imipramine100, Topamax100Bid, Imitrex 100mg  prn; she has been out of the Imitrex for along time due to cost, using OTC analgesics as needed...    Chronic Insomnia>  On Ambien 10mg  Qhs as needed for sleep...    Anxiety>  Under a lot of stress- mother has moved in w/ her, no currently on an anxiolytic rx... We reviewed prob list, meds, xrays and labs> see below for updates >>  CXR 10/13 showed norm heart size, clear lungs, osteopenic appearing bones... EKG 10/13 showed NSR, rate 85, IVCD, poor R progression V1-3... LABS 10/13: (Non-fasting) Chems- wnl;  CBC- wnl;  TSH=0.76  01/30/2014 Acute OV  Presents for an acute office visit. Complains of sore throat, dry cough, body aches, chills , wheezing, increased SOB x3 days.  D izziness this morning.  Denies nausea, vomiting, hemoptysis, chest pain, urinary symptoms, abd pain , recent travel or abx use.  On arrival to office sb/p noted to be in 70s . She is on b/p rx that she took last night  She is Geologist, engineering for [employer]  Drove self to office today .           Problem List:  HYPERTENSION (ICD-401.9) - controlled on LISINOPRIL 40mg /d, and HCTZ 25mg /d...  ~  5/12:  BP= 138/84> she tolerates meds well, takes regularly & home BP checks are OK when not in pain; denies fatigue, visual changes, CP, palipit, dizziness, syncope, dyspnea, edema, etc... ~   10/13:  BP= 112/80 on Lisin40 & Hct25; presented w/ CP/ SOB & we decided to decr Lisin20 & add MetopER50, refer to Cards...  CHEST PAIN >> ~  10/13:  See above - referred to Cards  Hx of DIARRHEA (ICD-787.91) - on QUESTRAN 4gm packet daily as needed... this really helps her prev chr diarrhea problem and she feels it has been a life-saver! ~  10/13:  She notes that she only uses the Questran about once per month now, doing very well w/o recurrent diarrhea...  Hx of PLANTAR FASCIITIS (ICD-728.71) - Eval and Rx by DrBednarz in past... she uses Ibuprofen Prn.  Hx of HEADACHE (ICD-784.0) - eval by DrLewitt and treated w/ TOPAMAX 100mg /d, IMIPRAMINE 50mg - 2daily, & IMITREX 100mg Prn... she still gets HA's but feels that they are manageable w/ this regimen...  She requests refill Rx from [place] due to cost of seeing DrLewitt & the fact that she has been well controlled on a stable med regimen...  Hx of DEPRESSION (ICD-311) - prev on Lexapro perscribed by DrLomax... she feels that it helped but ran out & doing OK since then (her husb died of sudden cardiac death Dec 02, 2022)...  INSOMNIA, CHRONIC (ICD-307.42) - she has chronic persistant insomnia treated w/ ZOLPIDEM 10mg  Qhs...  We discussed alternative OTC Rx w/ Melatonin & TylenolPM.  Health Maintenance:  she had full labs from [place] in 2004, and from DrLomax in 2007 & all WNL.Marland Kitchen. ~  GYN= DrLomax w/ hysterectomy in 1985... on VIVELLE patch... he does BMD's w/ report of Osteopenia 8/07... ~  GI = DrPerry w/ colonoscopy 12/07 that was WNL... f/u planned 48yrs. ~  Immunizations:  she gets the yearly Flu vaccine.   Past Surgical History  Procedure Laterality Date  . Vesicovaginal fistula closure w/ tah  1985    Dr. Nicholas Lose  . Bunionectomy  2007    Dr. Lestine Box    Outpatient Encounter Prescriptions as of 01/30/2014  Medication Sig  . aspirin 81 MG tablet Take 81 mg by mouth daily.  . cholestyramine (QUESTRAN) 4 G packet Take 1 packet by mouth daily as needed.  .  hydrochlorothiazide (HYDRODIURIL) 25 MG tablet TAKE ONE TABLET BY MOUTH EVERY DAY  . ibuprofen (ADVIL,MOTRIN) 200 MG tablet Take 200 mg by mouth every 6 (six) hours as needed.    Marland Kitchen imipramine (TOFRANIL) 50 MG tablet TAKE TWO TABLETS BY MOUTH EVERY DAY  . lisinopril (PRINIVIL,ZESTRIL) 20 MG tablet Take 1 tablet (20 mg total) by mouth daily.  . SUMAtriptan (IMITREX) 100 MG tablet Take 1 tablet (100 mg total) by mouth once as needed for migraine.  . topiramate (TOPAMAX) 100 MG tablet Take 1 tablet (100 mg total) by  mouth 2 (two) times daily.  Marland Kitchen zolpidem (AMBIEN) 10 MG tablet Take 1 tablet (10 mg total) by mouth at bedtime as needed.  . metoprolol succinate (TOPROL-XL) 50 MG 24 hr tablet Take 1 tablet (50 mg total) by mouth daily. Take with or immediately following a meal.  . [DISCONTINUED] ciprofloxacin (CIPRO) 250 MG tablet Take 1 tablet (250 mg total) by mouth 2 (two) times daily.    Allergies not on file >> NKDA...   Review of Systems         See HPI - all other systems neg except as noted...  The patient denies anorexia, fever, weight loss, weight gain, vision loss, decreased hearing  chest pain, syncope, dyspnea on exertion, peripheral edema, prolonged cough, hemoptysis, abdominal pain, melena, hematochezia, severe indigestion/heartburn, hematuria, incontinence, muscle weakness, suspicious skin lesions, transient blindness, difficulty walking, depression, unusual weight change, abnormal bleeding, enlarged lymph nodes, and angioedema.     Objective:   Physical Exam     WD, WN, 63 y/o WF , appears weak, orthostatic b/p 70  GENERAL:  Alert & oriented; pleasant & cooperative... HEENT:  [place]/AT, EOM-wnl, PERRLA, EACs-clear, TMs-wnl, NOSE-clear, THROAT-clear & wnl. NECK:  Supple w/ full ROM; no JVD; normal carotid impulses w/o bruits; no thyromegaly or nodules palpated; no lymphadenopathy. CHEST:  Clear to P & A; without wheezes/ rales/ or rhonchi. HEART:  Regular Rhythm; without murmurs/ rubs/  or gallops. ABDOMEN:  Soft & nontender; normal bowel sounds; no organomegaly or masses detected. EXT: without deformities or arthritic changes; no varicose veins/ venous insuffic/ or edema. NEURO:  CN's intact; motor testing normal; sensory testing normal; gait normal & balance OK. DERM:  No lesions noted; no rash etc...    Assessment & Plan:

## 2014-01-30 NOTE — ED Provider Notes (Signed)
CSN: [id number]     Arrival date & time 01/30/14  1042 History   First MD Initiated Contact with Patient 01/30/14 1056     Chief Complaint  Patient presents with  . Hypotension     (Consider location/radiation/quality/duration/timing/severity/associated sxs/prior Treatment) The history is provided by the patient. No language interpreter was used.  This is a 62yo WF with PMH HTN, insomnia, depression who presents from PCPs office with hypotension. Pt has had ST, chills, dry cough, body aches with some DOE x 3 days. She went to her PCP today for these symptoms and when she got out of her car she began feeling lightheaded. Per PCP note, patient's SBP was in the 70s when she was evaluated in their office. She was sent to ED via EMS found to have BP 89/60 and HR 101. Patient denies chest pain, abd pain, N/V/D, dysuria, increased urinary frequency, BLE swelling. She last took her antihypertensive medications last night (lisinopril, toprol xl and HCTZ). She has been taking PO well over this time period.   Past Medical History  Diagnosis Date  . Hypertension   . Diarrhea   . Plantar fasciitis   . Depression   . Insomnia    Past Surgical History  Procedure Laterality Date  . Vesicovaginal fistula closure w/ tah  1985    Dr. Nicholas Lose  . Bunionectomy  2007    Dr. Lestine Box   Family History  Problem Relation Age of Onset  . Hypertension      mother, father, brother  . CAD Father 81  . Valvular heart disease Mother     MVP   History  Substance Use Topics  . Smoking status: Never Smoker   . Smokeless tobacco: Never Used  . Alcohol Use: No   OB History   Grav Para Term Preterm Abortions TAB SAB Ect Mult Living                 Review of Systems  Constitutional: Positive for chills and fatigue. Negative for fever.  HENT: Positive for sore throat.   Eyes: Negative for visual disturbance.  Respiratory: Positive for cough and shortness of breath.   Cardiovascular: Negative for chest pain,  palpitations and leg swelling.  Gastrointestinal: Negative for nausea, vomiting, abdominal pain and diarrhea.  Endocrine: Negative for polyuria.  Genitourinary: Negative for dysuria and frequency.  Musculoskeletal:       Diffuse body aches  Neurological: Positive for light-headedness. Negative for dizziness, syncope, facial asymmetry, weakness, numbness and headaches.  All other systems reviewed and are negative.      Allergies  Other  Home Medications   Current Outpatient Rx  Name  Route  Sig  Dispense  Refill  . aspirin 81 MG tablet   Oral   Take 81 mg by mouth daily.         . cholestyramine (QUESTRAN) 4 G packet   Oral   Take 1 packet by mouth daily as needed.   90 each   3   . hydrochlorothiazide (HYDRODIURIL) 25 MG tablet      TAKE ONE TABLET BY MOUTH EVERY DAY   90 tablet   1   . ibuprofen (ADVIL,MOTRIN) 200 MG tablet   Oral   Take 200 mg by mouth every 6 (six) hours as needed.           Marland Kitchen imipramine (TOFRANIL) 50 MG tablet      TAKE TWO TABLETS BY MOUTH EVERY DAY   180 tablet   0   .  lisinopril (PRINIVIL,ZESTRIL) 20 MG tablet   Oral   Take 1 tablet (20 mg total) by mouth daily.   90 tablet   3   . metoprolol succinate (TOPROL-XL) 50 MG 24 hr tablet   Oral   Take 1 tablet (50 mg total) by mouth daily. Take with or immediately following a meal.   90 tablet   3   . SUMAtriptan (IMITREX) 100 MG tablet   Oral   Take 1 tablet (100 mg total) by mouth once as needed for migraine.   30 tablet   5   . topiramate (TOPAMAX) 100 MG tablet   Oral   Take 1 tablet (100 mg total) by mouth 2 (two) times daily.   180 tablet   1   . zolpidem (AMBIEN) 10 MG tablet   Oral   Take 1 tablet (10 mg total) by mouth at bedtime as needed.   90 tablet   1    BP 105/66  Pulse 99  Temp(Src) 97.9 F (36.6 C) (Oral)  Resp 25  SpO2 97% Physical Exam  Constitutional: She is oriented to person, place, and time. She appears well-developed and well-nourished.  No distress.  HENT:  Head: Normocephalic and atraumatic.  Mouth/Throat: No oropharyngeal exudate.  MMM, though oropharynx slightly erythematous  Eyes: Conjunctivae and EOM are normal. Pupils are equal, round, and reactive to light.  Neck: Normal range of motion. Neck supple.  Cardiovascular: Regular rhythm and normal heart sounds.   Tachycardic to 101  Pulmonary/Chest: Effort normal. No respiratory distress. She has no wheezes. She has no rales.  Diminished breath sounds to middle L lung field; otherwise breath sounds are normal  Abdominal: Soft. Bowel sounds are normal. She exhibits no distension. There is no tenderness.  Musculoskeletal: She exhibits no edema.  Neurological: She is alert and oriented to person, place, and time. No cranial nerve deficit. Coordination normal.  Skin: Skin is warm and dry. She is not diaphoretic.    ED Course  Procedures (including critical care time) Labs Review Labs Reviewed  CBC WITH DIFFERENTIAL - Abnormal; Notable for the following:    Platelets 125 (*)    Lymphs Abs 0.6 (*)    All other components within normal limits  BASIC METABOLIC PANEL - Abnormal; Notable for the following:    Potassium 3.5 (*)    Glucose, Bld 108 (*)    Creatinine, Ser 1.29 (*)    GFR calc non Af Amer 44 (*)    GFR calc Af Amer 51 (*)    All other components within normal limits  URINALYSIS, ROUTINE W REFLEX MICROSCOPIC - Abnormal; Notable for the following:    APPearance CLOUDY (*)    Leukocytes, UA SMALL (*)    All other components within normal limits  URINE MICROSCOPIC-ADD ON - Abnormal; Notable for the following:    Bacteria, UA FEW (*)    Casts HYALINE CASTS (*)    Crystals CA OXALATE CRYSTALS (*)    All other components within normal limits  URINE CULTURE  I-STAT CG4 LACTIC ACID, ED  Rosezena Sensor, ED   Imaging Review Dg Chest 2 View  01/30/2014   CLINICAL DATA:  Cough and congestion.  EXAM: CHEST  2 VIEW  COMPARISON:  08/30/2012  FINDINGS: Cardiac  silhouette is normal in size. Normal mediastinum and hila. Minor scarring in the left upper lobe lingula. Lungs are otherwise clear. No pleural effusion or pneumothorax.  Bony thorax is demineralized but intact.  IMPRESSION: No active cardiopulmonary disease.  Electronically Signed   By: Amie Portlandavid  Ormond M.D.   On: 01/30/2014 11:55     EKG Interpretation None      MDM  This is a 62yo WF w/ PMH HTN, insomnia, depression who presents to ED w/ URI type symptoms and hypotension. Patient is afebrile, though continues to be hypotensive and tachycardic in the ED. Patient has no leukocytosis, normal troponin and lactic acid. Cr is elevated at 1.29. CXR is wnl. EKG is normal. She is taking numerous medications that can cause hypotension such as her antihypertensives, Ambien and Topamax. Will give 1L NS now.   3:03 PM I just rechecked patient's vitals and her BP 99/62, HR 90, SpO2 is 98% on room air.   3:03 PM Patient has been up and walking around without any dizziness. Blood pressure back up to 130/80. Her hypotension was likely secondary to hypovolemia and/or side effect of one of her medications. I do not suspect sepsis at this time. Patient likely has a viral URI that should resolve on its own within the next week or so. Of note, patient's UA shows few bacteria with 3-6 WBCs/hpf. Sent UCx, did not treat. I instructed patient to discontinue her antihypertensives, ambien, and topamax until she is evaluated by her PCP in a few days. I also asked her to check her blood pressure at least twice daily and keep a log of her blood pressure. I instructed her to call her PCP or go to the ED if her blood pressure increases to >190/100. Patient agrees with our plan.    Windell Hummingbirdachel [place] Huberty, MD 01/30/14 1504

## 2014-01-30 NOTE — ED Notes (Signed)
Patient's EKG will not export. Fleet Contrasachel MD notified.

## 2014-01-30 NOTE — Patient Instructions (Signed)
To ER via EMS /report called [facility] Consulting [facility]Charge RN [facility]

## 2014-01-31 ENCOUNTER — Telehealth: Payer: Self-pay | Admitting: Pulmonary Disease

## 2014-01-31 LAB — URINE CULTURE
Colony Count: NO GROWTH
Culture: NO GROWTH

## 2014-01-31 MED ORDER — AZITHROMYCIN 250 MG PO TABS: ORAL_TABLET | ORAL | Status: DC

## 2014-01-31 MED ORDER — FIRST-DUKES MOUTHWASH MT SUSP: OROMUCOSAL | Status: DC

## 2014-01-31 NOTE — Telephone Encounter (Signed)
Pt called back.  She would like to use [facility] on [address] if you can fax them a copy of her ins card.  She states if you can't fax the ins card then still use [facility] [place].

## 2014-01-31 NOTE — Telephone Encounter (Signed)
Per SN---  How has her BP been at home?  1.  MMW  4 oz  1 tsp gargle and swallow four times daily as needed 2.  zpak #1  Take as directed 3.  Delsym otc  2 tsp bid

## 2014-01-31 NOTE — Telephone Encounter (Signed)
Called spoke with patient who reported that her dizziness and hypotension are improved since her ED visit yesterday 3.24.15: MDM   This is a 62yo WF w/ PMH HTN, insomnia, depression who presents to ED w/ URI type symptoms and hypotension. Patient is afebrile, though continues to be hypotensive and tachycardic in the ED. Patient has no leukocytosis, normal troponin and lactic acid. Cr is elevated at 1.29. CXR is wnl. EKG is normal. She is taking numerous medications that can cause hypotension such as her antihypertensives, Ambien and Topamax. Will give 1L NS now.  3:03 PM  I just rechecked patient's vitals and her BP 99/62, HR 90, SpO2 is 98% on room air.  3:03 PM  Patient has been up and walking around without any dizziness. Blood pressure back up to 130/80. Her hypotension was likely secondary to hypovolemia and/or side effect of one of her medications. I do not suspect sepsis at this time. Patient likely has a viral URI that should resolve on its own within the next week or so. Of note, patient's UA shows few bacteria with 3-6 WBCs/hpf. Sent UCx, did not treat. I instructed patient to discontinue her antihypertensives, ambien, and topamax until she is evaluated by her PCP in a few days. I also asked her to check her blood pressure at least twice daily and keep a log of her blood pressure. I instructed her to call her PCP or go to the ED if her blood pressure increases to >190/100. Patient agrees with our plan. ~~~~~~~~~~~~~~~~~~~~~~~~~~~~~~~~~~~~~~~~~~~~~~~~~~~~~~~~~~~~~~~~~~~~~~~~~~~~~~~~~~~~~~~~ However, she is still experiencing sore throat, dry cough, body aches, wheezing, increased SOB x3 days. denies nausea, vomiting, hemoptysis.  TP is off this afternoon.  SN please advise, thank you. Allergies  Allergen Reactions  . Other     Onions & Chocolate cause migraine

## 2014-01-31 NOTE — Telephone Encounter (Signed)
Called spoke with patient and advised of SN's recs as stated below.  Pt okay with these recommendations and verbalized her understanding.  Pt stated that her BP earlier this afternoon was 118/86, HR 117.  Advised pt to continue to push fluids (water, Gatorade, Propel, or juices) and to call the office if her symptoms persist or worsen.  [facility]  Will sign and forward to SN as FYI regarding pt's BP at home.

## 2014-02-01 NOTE — ED Provider Notes (Signed)
I saw and evaluated the patient, reviewed the resident's note and I agree with the findings and plan.   EKG Interpretation   Date/Time:  Tuesday January 30 2014 11:23:32 EDT Ventricular Rate:  93 PR Interval:  177 QRS Duration: 98 QT Interval:  337 QTC Calculation: 419 R Axis:   45 Text Interpretation:  Sinus rhythm Low voltage, precordial leads ED  PHYSICIAN INTERPRETATION AVAILABLE IN [facility] Confirmed by TEST,  Record ([id number]) on 02/01/2014 7:25:29 AM     Patient with hypertension. Has resolved. Patient not septic. Patient feels better and will be discharged home. Lab work and EKG are reassuring.  Juliet RudeNathan R. Rubin PayorPickering, MD 02/01/14 2230

## 2014-02-02 ENCOUNTER — Other Ambulatory Visit (INDEPENDENT_AMBULATORY_CARE_PROVIDER_SITE_OTHER): Payer: BC Managed Care – PPO

## 2014-02-02 ENCOUNTER — Encounter: Payer: Self-pay | Admitting: Adult Health

## 2014-02-02 ENCOUNTER — Ambulatory Visit (INDEPENDENT_AMBULATORY_CARE_PROVIDER_SITE_OTHER): Payer: BC Managed Care – PPO | Admitting: Adult Health

## 2014-02-02 VITALS — BP 112/82 | HR 98 | Temp 98.5°F | Ht 62.0 in | Wt 155.8 lb

## 2014-02-02 DIAGNOSIS — I959 Hypotension, unspecified: Secondary | ICD-10-CM

## 2014-02-02 DIAGNOSIS — J069 Acute upper respiratory infection, unspecified: Secondary | ICD-10-CM

## 2014-02-02 DIAGNOSIS — I10 Essential (primary) hypertension: Secondary | ICD-10-CM

## 2014-02-02 LAB — BASIC METABOLIC PANEL
BUN: 14 mg/dL (ref 6–23)
CHLORIDE: 103 meq/L (ref 96–112)
CO2: 29 mEq/L (ref 19–32)
Calcium: 9 mg/dL (ref 8.4–10.5)
Creatinine, Ser: 0.8 mg/dL (ref 0.4–1.2)
GFR: 74.46 mL/min (ref >60.00)
Glucose, Bld: 90 mg/dL (ref 70–99)
Potassium: 3.3 mEq/L — ABNORMAL LOW (ref 3.5–5.1)
SODIUM: 138 meq/L (ref 135–145)

## 2014-02-02 NOTE — Assessment & Plan Note (Signed)
B/p remains on lower side of nml since viral illness Check bmet today   Plan  Decrease Lisinopril 20mg  1/2 daily  Change HCTZ 25mg  daily As needed  Swelling  Check blood pressure daily , keep log, call if <90 or >150 .  Push fluids  Advance diet as tolerated Labs today .  follow up for blood pressure recheck in 6 weeks with new doctor  Please contact office for sooner follow up if symptoms do not improve or worsen or seek emergency care  Dr. Kriste BasqueNadel is retiring and we will need to set you up with a new Primary Care provider.  Please let us know if you would like us to assist with this referral.

## 2014-02-02 NOTE — Assessment & Plan Note (Signed)
Resolved w/ recent viral illness   Plan  Will decrease lisinopril and change hctz to As needed  Only .

## 2014-02-02 NOTE — Progress Notes (Signed)
Subjective:    Patient ID: Kelli Contreras, female    DOB: [birth year], 63 y.o.   MRN: [id number]  HPI 63  y/o WF here for a follow up visit...   ~  April 14, 2010:  she called requesting an add-on appt after 80yr hiatus due to HA... hx migraines w/ Eval & Rx from DrLewitt in the past> she c/o the co-pay for specialists & hasn't been back to see him... prev well controlled on Topamax 100mg /d, Imipramine 50mg - 2 tabs daily, and Imitrex 100mg  as needed (she ran out of the Topamax & Imitrex w/ onset of HA this week)... also has hx HBP & prev well controlled on Lisinopril 40mg /d + HCTZ 25mg /d... notes BP up when she is in pain w/ HA & not the other way around... also notes difficulty sleeping w/ hx chronic persisitant insomnia- ran out of Ambien & not resting at all... we discussed restarting her regular med regimen and scheduling a CPX w/ CXR, EKG, fasting labs for later this yr...  ~  May 23,2012:  Yearly ROV & she requests refill prescriptions... She hasn't had CPX- labs, Xray, EKG, etc- due to no insurance & requests that we don't do these important screening procedures at this time... 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  ~  August 30, 2012:  32mo ROV & Kelli Contreras presents c/o several month hx SS CP that appears to come on w/ activity & go away w/ rest; it's been present for several months & has recently incr frequency to ~1/wk or so; it comes on w/ activ, feels like a dull pressure in chest, radiates out to the arms, lasts <30min & resolves spont w/ rest; it is occas assoc w/ SOB  & she has had some dizziness but she denies palpit, syncope, edema...  As prev noted she has NEVER had a baseline CXR, EKG, Labs as she has not had insurance & refused CPX etc... Now she has insurance coverage through a new job & wants to get this evaluated... Known HBP,  nonsmoker, but we don't have baseline Lipids or BS...    Cricopharyngeus dysfunction>  Notes occas episodes of throat closing, can't breathe, can't get the air in, face feels numb; offered Klonopin Rx but she says rare episodes & wants to hold off...    CP>  Presented 10/13 w/ SSCP and abn EKG==> meds adjusted (decr Lisinopril & added ASA, MetopER), referred to Cards for further eval...    HBP>  Controlled on Lisin20Bid, Hct25; BP= 112/80, not checking at home; denies recent HA, palpit, SOB, edema...    HxDiarrhea>  On Questran but only uses this as needed & averages <1/month...    HxMigraines>  On Imipramine100, Topamax100Bid, Imitrex 100mg  prn; she has been out of the Imitrex for along time due to cost, using OTC analgesics as needed...    Chronic Insomnia>  On Ambien 10mg  Qhs as needed for sleep...    Anxiety>  Under a lot of stress- mother has moved in w/ her, no currently on an anxiolytic rx... We reviewed prob list, meds, xrays and labs> see below for updates >>  CXR 10/13 showed norm heart size, clear lungs, osteopenic appearing bones... EKG 10/13 showed NSR, rate 85, IVCD, poor R progression V1-3... LABS 10/13: (Non-fasting) Chems- wnl;  CBC- wnl;  TSH=0.76  01/30/14 Acute OV  Presents for an acute office visit. Complains of sore throat, dry cough, body aches, chills , wheezing, increased SOB x3 days.  D izziness this morning.  Denies nausea, vomiting, hemoptysis, chest pain, urinary symptoms, abd pain , recent travel or abx use.  On arrival to office sb/p noted to be in 70s . She is on b/p rx that she took last night  She is Geologist, engineeringteacher assistant for [employer]  Drove self to office today .  >>ER via EMS   02/02/2014 ER follow up  Was seen 3 days ago with weakness, viral symptoms and dizziness. Found to be hypotensive w/ sbp ~70 and orthostatic.  She was sent to ER  Where workup was unrevealing ,w/ neg labs (no leukocytosis, nml troponin/LA  and cxr . sCR was increased at 1.29 . She was given 1  L IVF.  Pt called back with complaints of cough with green mucus . [place] in [facility] on 3/25 and MMW .  Says she still feels bad . Still having prod cough with green mucus, fatigue, HA, wheezing, dyspnea, loss of appetite/nausea, some diarrhea.  B/;p is beter 110-138 . Has not taken b/p rx since ER visit. Denies any f/c/s, hemoptysis, vomiting. No further diarrhea. Feeling some better but still weak.  Not taking metoprolol never started as recommended.            Problem List:  HYPERTENSION (ICD-401.9) - controlled on LISINOPRIL 40mg /d, and HCTZ 25mg /d...  ~  5/12:  BP= 138/84> she tolerates meds well, takes regularly & home BP checks are OK when not in pain; denies fatigue, visual changes, CP, palipit, dizziness, syncope, dyspnea, edema, etc... ~  10/13:  BP= 112/80 on Lisin40 & Hct25; presented w/ CP/ SOB & we decided to decr Lisin20 & add MetopER50, refer to Cards...  CHEST PAIN >> ~  10/13:  See above - referred to Cards  Hx of DIARRHEA (ICD-787.91) - on QUESTRAN 4gm packet daily as needed... this really helps her prev chr diarrhea problem and she feels it has been a life-saver! ~  10/13:  She notes that she only uses the Questran about once per month now, doing very well w/o recurrent diarrhea...  Hx of PLANTAR FASCIITIS (ICD-728.71) - Eval and Rx by DrBednarz in past... she uses Ibuprofen Prn.  Hx of HEADACHE (ICD-784.0) - eval by DrLewitt and treated w/ TOPAMAX 100mg /d, IMIPRAMINE 50mg - 2daily, & IMITREX 100mg Prn... she still gets HA's but feels that they are manageable w/ this regimen...  She requests refill Rx from us due to cost of seeing DrLewitt & the fact that she has been well controlled on a stable med regimen...  Hx of DEPRESSION (ICD-311) - prev on Lexapro perscribed by DrLomax... she feels that it helped but ran out & doing OK since then (her husb died of sudden cardiac death 1/08)...  INSOMNIA, CHRONIC (ICD-307.42) - she has chronic persistant insomnia treated w/ ZOLPIDEM  10mg  Qhs...  We discussed alternative OTC Rx w/ Melatonin & TylenolPM.  Health Maintenance:  she had full labs from us in 2004, and from DrLomax in 2007 & all WNL.Marland Kitchen.. ~  GYN= DrLomax w/ hysterectomy in 1985... on VIVELLE patch... he does BMD's w/ report of Osteopenia 8/07... ~  GI = DrPerry w/ colonoscopy 12/07 that was WNL... f/u planned 7969yrs. ~  Immunizations:  she gets the yearly Flu vaccine.   Past Surgical History  Procedure Laterality Date  . Vesicovaginal fistula closure w/ tah  1985    Dr. Nicholas LoseLomax  .  Bunionectomy  2007    Dr. Lestine Box    Outpatient Encounter Prescriptions as of 02/02/2014  Medication Sig  . aspirin 81 MG tablet Take 81 mg by mouth daily.  Marland Kitchen azithromycin (ZITHROMAX) 250 MG tablet Take as directed  . cholestyramine (QUESTRAN) 4 G packet Take 1 packet by mouth daily as needed.  . Diphenhyd-Hydrocort-Nystatin (FIRST-DUKES MOUTHWASH) SUSP Gargle and swallow 1 tsp four times daily  . hydrochlorothiazide (HYDRODIURIL) 25 MG tablet TAKE ONE TABLET BY MOUTH EVERY DAY  . ibuprofen (ADVIL,MOTRIN) 200 MG tablet Take 200 mg by mouth every 6 (six) hours as needed.    Marland Kitchen imipramine (TOFRANIL) 50 MG tablet TAKE TWO TABLETS BY MOUTH EVERY DAY  . lisinopril (PRINIVIL,ZESTRIL) 20 MG tablet Take 1 tablet (20 mg total) by mouth daily.  . metoprolol succinate (TOPROL-XL) 50 MG 24 hr tablet Take 1 tablet (50 mg total) by mouth daily. Take with or immediately following a meal.  . SUMAtriptan (IMITREX) 100 MG tablet Take 1 tablet (100 mg total) by mouth once as needed for migraine.  . topiramate (TOPAMAX) 100 MG tablet Take 1 tablet (100 mg total) by mouth 2 (two) times daily.  Marland Kitchen zolpidem (AMBIEN) 10 MG tablet Take 1 tablet (10 mg total) by mouth at bedtime as needed.    Allergies not on file >> NKDA...   Review of Systems         See HPI - all other systems neg except as noted...  The patient denies anorexia, fever, weight loss, weight gain, vision loss, decreased hearing  chest  pain, syncope, dyspnea on exertion, peripheral edema, prolonged cough, hemoptysis, abdominal pain, melena, hematochezia, severe indigestion/heartburn, hematuria, incontinence, muscle weakness, suspicious skin lesions, transient blindness, difficulty walking, depression, unusual weight change, abnormal bleeding, enlarged lymph nodes, and angioedema.     Objective:   Physical Exam     WD, WN, 63 y/o WF NAD  GENERAL:  Alert & oriented; pleasant & cooperative... HEENT:  /AT, EOM-wnl, PERRLA, EACs-clear, TMs-wnl, NOSE-clear, THROAT-clear & wnl. NECK:  Supple w/ full ROM; no JVD; normal carotid impulses w/o bruits; no thyromegaly or nodules palpated; no lymphadenopathy. CHEST:  Clear to P & A; without wheezes/ rales/ or rhonchi. HEART:  Regular Rhythm; without murmurs/ rubs/ or gallops. ABDOMEN:  Soft & nontender; normal bowel sounds; no organomegaly or masses detected. EXT: without deformities or arthritic changes; no varicose veins/ venous insuffic/ or edema. NEURO:  CN's intact; motor testing normal; sensory testing normal; gait normal & balance OK. DERM:  No lesions noted; no rash etc...    Assessment & Plan:

## 2014-02-02 NOTE — Patient Instructions (Signed)
Decrease Lisinopril 20mg  1/2 daily  Change HCTZ 25mg  daily As needed  Swelling  Check blood pressure daily , keep log, call if <90 or >150 .  Push fluids  Advance diet as tolerated Labs today .  follow up for blood pressure recheck in 6 weeks with new doctor  Please contact office for sooner follow up if symptoms do not improve or worsen or seek emergency care  Dr. Kriste BasqueNadel is retiring and we will need to set you up with a new Primary Care provider.  Please let us know if you would like us to assist with this referral.

## 2014-02-02 NOTE — Assessment & Plan Note (Signed)
Finish zpack as directed  cXR was clear .   Plan  Finish zpack  Push fluids  Advance diet as tolerated Labs today .  Please contact office for sooner follow up if symptoms do not improve or worsen or seek emergency care  Dr. Kriste BasqueNadel is retiring and we will need to set you up with a new Primary Care provider.  Please let us know if you would like us to assist with this referral.

## 2014-02-02 NOTE — Addendum Note (Signed)
Addended by: Boone MasterJONES, JESSICA E on: 02/02/2014 11:43 AM   Modules accepted: Orders

## 2014-02-05 ENCOUNTER — Telehealth: Payer: Self-pay | Admitting: Adult Health

## 2014-02-05 NOTE — Telephone Encounter (Signed)
Per SN -   Continue to take 10mg  of Lisinopril. Keep check on BP this week. Needs to be seen next week - have Leigh make appointment.  Pt is aware of SN's recs. Will forward message to Leigh to call pt to make appointment for next week.

## 2014-02-05 NOTE — Telephone Encounter (Signed)
Spoke with pt. At 6:30am this morning her BP was 124/79. When she got home this afternoon at 3pm it was 148/100. She has been taking 10mg  of Lisinopril daily. Wanting to know how to proceed with her medication.  TP - please advise. Thanks.  *Has upcoming appointment with new PCP in 04/2014.

## 2014-02-05 NOTE — Telephone Encounter (Signed)
Discussed with TP :: increase Lisinopril back up to 20mg  daily.  If BP does not improve or worsen will need to follow up with new PCP.  Thanks.

## 2014-02-05 NOTE — Telephone Encounter (Signed)
Ok to add pt on the schedule for SN 4-1 in the afternoon.  i have called and lmomtcb for the pt so we may schedule this appt with SN.

## 2014-02-06 NOTE — Telephone Encounter (Signed)
Apt set for 02-07-14 at 2pm. Carron CurieJennifer Castillo, CMA

## 2014-02-06 NOTE — Telephone Encounter (Signed)
LMTCBx2 on home and cell #. Christain Mcraney, CMA  

## 2014-02-07 ENCOUNTER — Ambulatory Visit (INDEPENDENT_AMBULATORY_CARE_PROVIDER_SITE_OTHER): Payer: BC Managed Care – PPO | Admitting: Pulmonary Disease

## 2014-02-07 ENCOUNTER — Encounter: Payer: Self-pay | Admitting: Pulmonary Disease

## 2014-02-07 VITALS — BP 122/80 | HR 88 | Temp 98.1°F | Ht 62.0 in | Wt 154.8 lb

## 2014-02-07 DIAGNOSIS — G4700 Insomnia, unspecified: Secondary | ICD-10-CM

## 2014-02-07 DIAGNOSIS — I10 Essential (primary) hypertension: Secondary | ICD-10-CM

## 2014-02-07 DIAGNOSIS — R51 Headache: Secondary | ICD-10-CM

## 2014-02-07 MED ORDER — METOPROLOL SUCCINATE ER 50 MG PO TB24: ORAL_TABLET | ORAL | Status: DC

## 2014-02-07 MED ORDER — LISINOPRIL 20 MG PO TABS: 10.0000 mg | ORAL_TABLET | Freq: Every day | ORAL | Status: DC

## 2014-02-07 MED ORDER — IMIPRAMINE HCL 50 MG PO TABS: ORAL_TABLET | ORAL | Status: DC

## 2014-02-07 NOTE — Progress Notes (Signed)
Quick Note:  LMOM TCB x1. ______ 

## 2014-02-07 NOTE — Patient Instructions (Signed)
Today we updated your med list in our EPIC system...    Continue your current medications the same...  We decided to add a second medication in low dose for your BP>    Start the METOPROLOL-ER 50mg  tabs- 1/2 tab daily for now    Continue the LISINOPRIL 20mg  tabs- 1/2 tab daily as well...  Continue to monitor your BP at home & call for any questions...  I rec a general physical sometime in the next 2-8125mo w/ Fasting blood work.Marland Kitchen..Marland Kitchen

## 2014-02-07 NOTE — Progress Notes (Addendum)
Subjective:    Patient ID: Kelli Contreras, female    DOB: [birth year], 63 y.o.   MRN: [id number]  HPI 63 y/o WF here for a follow up visit...   ~  April 14, 2010:  she called requesting an add-on appt after 3235yr hiatus due to HA... hx migraines w/ Eval & Rx from DrLewitt in the past> she c/o the co-pay for specialists & hasn't been back to see him... prev well controlled on Topamax 100mg /d, Imipramine 50mg - 2 tabs daily, and Imitrex 100mg  as needed (she ran out of the Topamax & Imitrex w/ onset of HA this week)... also has hx HBP & prev well controlled on Lisinopril 40mg /d + HCTZ 25mg /d... notes BP up when she is in pain w/ HA & not the other way around... also notes difficulty sleeping w/ hx chronic persisitant insomnia- ran out of Ambien & not resting at all... we discussed restarting her regular med regimen and scheduling a CPX w/ CXR, EKG, fasting labs for later this yr...  ~  May 23,2012:  Yearly ROV & she requests refill prescriptions... She hasn't had CPX- labs, Xray, EKG, etc- due to no insurance & requests that we don't do these important screening procedures at this time... She states BP controlled on meds;  HAs doing satis on her maintenance HA prescriptions;  Still needs the Ambien for her chronic persistant insomnia, but at least it works well & is Tree surgeontol satis...  ~  August 30, 2012:  61mo ROV & Kelli Contreras presents c/o several month hx SS CP that appears to come on w/ activity & go away w/ rest; it's been present for several months & has recently incr frequency to ~1/wk or so; it comes on w/ activ, feels like a dull pressure in chest, radiates out to the arms, lasts <545min & resolves spont w/ rest; it is occas assoc w/ SOB  & she has had some dizziness but she denies palpit, syncope, edema...  As prev noted she has NEVER had a baseline CXR, EKG, Labs as she has not had insurance & refused CPX etc... Now she has insurance coverage through a new job & wants to get this evaluated... Known HBP,  nonsmoker, but we don't have baseline Lipids or BS...    Cricopharyngeus dysfunction>  Notes occas episodes of throat closing, can't breathe, can't get the air in, face feels numb; offered Klonopin Rx but she says rare episodes & wants to hold off...    CP>  Presented 10/13 w/ SSCP and abn EKG==> meds adjusted (decr Lisinopril & added ASA, MetopER), referred to Cards for further eval...    HBP>  Controlled on Lisin20Bid, Hct25; BP= 112/80, not checking at home; denies recent HA, palpit, SOB, edema...    HxDiarrhea>  On Questran but only uses this as needed & averages <1/month...    HxMigraines>  On Imipramine100, Topamax100Bid, Imitrex 100mg  prn; she has been out of the Imitrex for along time due to cost, using OTC analgesics as needed...    Chronic Insomnia>  On Ambien 10mg  Qhs as needed for sleep...    Anxiety>  Under a lot of stress- mother has moved in w/ her, no currently on an anxiolytic rx... We reviewed prob list, meds, xrays and labs> see below for updates >>   CXR 10/13 showed norm heart size, clear lungs, osteopenic appearing bones...  EKG 10/13 showed NSR, rate 85, IVCD, poor R progression V1-3...  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     Cricopharyngeus dysfunction>  Notes occas episodes of throat  closing, can't breathe, can't get the air in, face feels numb; offered Klonopin Rx but she says rare episodes & wants to hold off...    CP>  Presented 10/13 w/ SSCP and abn EKG==> meds adjusted (decr Lisinopril & added ASA, MetopER), referred to Cards for further eval & seen by DrHochrein=> Myoview 12/13 was wnl...    HBP>  meds adjusted 3/15 due to low BP, then grad added back, on Lisin20-1/2 daily now; BP= 122/80, but higher at home w/ some palpit & we decided to add MetopER50- 1/2 tab daily to start...    HxDiarrhea>  On Questran but only uses this as needed & averages <1/month...    HxMigraines>  On Imipramine100, Topamax100Bid, Imitrex 100mg  prn; she has been out of the Imitrex for along time due to cost, using OTC analgesics as needed...    Chronic Insomnia>  On Ambien 10mg  Qhs as needed for sleep...    Anxiety>  Under a lot of stress- mother in [place], helps w/ grandkids, long days, etc... She doesn't want anxiolytic... We reviewed prob list, meds, xrays and labs> see below for updates >>   CXR 3/15 showed norm heart size, minor scarring in lingula, otherw clear, NAD.Marland KitchenMarland Kitchen  EKG 3/15 showed NSR, rate=93, low volt percod leads, NAD...  LABS 3/15 reviewed> Chems- ok w/ Cr from 1.3=>0.8, CBC- wnl...           Problem List:  HYPERTENSION (ICD-401.9) - controlled on LISINOPRIL 40mg /d, and HCTZ 25mg /d...  ~  5/12:  BP= 138/84> she tolerates meds well, takes regularly & home BP checks are OK when not in pain; denies fatigue, visual changes, CP, palipit, dizziness, syncope, dyspnea, edema, etc... ~  10/13:  BP= 112/80 on Lisin40 & Hct25; presented w/ CP/ SOB & we decided to decr Lisin20 & add MetopER50, refer to Cards... ~  3/15: she was sent to the ER w/ lowBP for IVF & mult meds were held> subseq restarted Lisin10mg /d; and MetopER50-1/2 tab added 4/15...   CHEST PAIN >> ~  10/13:  See above - referred to Cards ~  11/13: she had f/u DrHochrein> c/o chest discomfort, remote heart cath w/o CAD, his  note is reviewed; Myoview done 12/13 w/ low level exercise & dyspnea, norm BP response, no ischemia, norm wall motion, EF=70%; He rec BP control & LDL<100... ~  CXR 3/15 showed norm heart size, minor scarring in lingula, otherw clear, NAD... ~  EKG 3/15 showed NSR, rate=93, low volt percod leads, NAD.Marland Kitchen  Hx of DIARRHEA (ICD-787.91) - on QUESTRAN 4gm packet daily as needed... this really helps her prev chr diarrhea problem and she feels it has been a life-saver! ~  10/13:  She notes that she only uses the Questran about once per month now, doing very well w/o recurrent diarrhea...  Hx of PLANTAR FASCIITIS (ICD-728.71) - Eval and Rx by DrBednarz in past... she uses Ibuprofen Prn.  Hx of HEADACHE (ICD-784.0) - eval by DrLewitt and treated w/ TOPAMAX 100mg /d, IMIPRAMINE 50mg - 2daily, & IMITREX 100mg Prn... she still gets HA's but feels that they are manageable w/ this regimen...  She requests refill Rx from [place] due to cost of seeing DrLewitt & the fact that she has been well controlled on a stable med regimen...  Hx of DEPRESSION (ICD-311) - prev on Lexapro perscribed by DrLomax... she feels that it helped but ran out & doing OK since then (her husb died of sudden cardiac death 12/01/2022)...  INSOMNIA, CHRONIC (ICD-307.42) - she has chronic persistant insomnia treated w/ ZOLPIDEM 10mg  Qhs...  We discussed alternative OTC Rx w/ Melatonin & TylenolPM.  Health Maintenance:  she had full labs from [place] in 2004, and from DrLomax in 2007 & all WNL.Marland Kitchen. ~  GYN= DrLomax w/ hysterectomy in 1985... on VIVELLE patch... he does BMD's w/ report of Osteopenia 8/07... ~  GI = DrPerry w/ colonoscopy 12/07 that was WNL... f/u planned 67yrs. ~  Immunizations:  she gets the yearly Flu vaccine.   Past Surgical History  Procedure Laterality Date  . Vesicovaginal fistula closure w/ tah  1985    Dr. Nicholas Lose  . Bunionectomy  2007    Dr. Lestine Box    Outpatient Encounter Prescriptions as of 02/07/2014  Medication Sig  . aspirin 81  MG tablet Take 81 mg by mouth daily.  Marland Kitchen azithromycin (ZITHROMAX) 250 MG tablet Take as directed  . cholestyramine (QUESTRAN) 4 G packet Take 1 packet by mouth daily as needed.  . Diphenhyd-Hydrocort-Nystatin (FIRST-DUKES MOUTHWASH) SUSP Gargle and swallow 1 tsp four times daily  . hydrochlorothiazide (HYDRODIURIL) 25 MG tablet TAKE ONE TABLET BY MOUTH EVERY DAY  . ibuprofen (ADVIL,MOTRIN) 200 MG tablet Take 200 mg by mouth every 6 (six) hours as needed.    Marland Kitchen imipramine (TOFRANIL) 50 MG tablet TAKE TWO TABLETS BY MOUTH EVERY DAY  . lisinopril (PRINIVIL,ZESTRIL) 20 MG tablet Take 1 tablet (20 mg total) by mouth daily.  . SUMAtriptan (IMITREX) 100 MG tablet Take 1 tablet (100 mg total) by mouth once as needed for migraine.  . topiramate (TOPAMAX) 100 MG tablet Take 1 tablet (100 mg total) by mouth 2 (two) times daily.  Marland Kitchen zolpidem (AMBIEN) 10 MG tablet Take 1 tablet (10 mg total) by mouth at bedtime as needed.    Allergies not on file >> NKDA...   Review of Systems         See HPI - all other systems neg except as noted... The patient complains of headaches.  The patient denies anorexia, fever, weight loss, weight gain, vision loss, decreased hearing, hoarseness, chest pain, syncope, dyspnea on exertion, peripheral edema, prolonged cough, hemoptysis, abdominal pain, melena, hematochezia, severe indigestion/heartburn, hematuria, incontinence, muscle weakness, suspicious skin lesions, transient blindness, difficulty walking, depression, unusual weight change, abnormal bleeding, enlarged lymph nodes, and angioedema.     Objective:   Physical Exam     WD, WN, 63 y/o WF in NAD... GENERAL:  Alert & oriented; pleasant & cooperative... HEENT:  [place]/AT, EOM-wnl, PERRLA, EACs-clear, TMs-wnl, NOSE-clear, THROAT-clear & wnl. NECK:  Supple w/ full ROM; no JVD; normal carotid impulses w/o bruits; no thyromegaly or nodules palpated; no lymphadenopathy. CHEST:  Clear to P & A; without wheezes/ rales/ or  rhonchi. HEART:  Regular Rhythm; without murmurs/ rubs/ or gallops. ABDOMEN:  Soft & nontender; normal bowel sounds; no organomegaly or masses detected. EXT: without deformities or arthritic changes; no varicose veins/ venous insuffic/ or edema. NEURO:  CN's intact; motor testing normal; sensory testing normal; gait normal & balance OK. DERM:  No lesions noted; no rash etc...  RADIOLOGY DATA:  Reviewed in the EPIC EMR & discussed w/ the patient...  LABORATORY DATA:  Reviewed in the EPIC EMR & discussed w/ the patient...   Assessment & Plan:    Chest Pain>  She has indeterminate CP w/ an abn EKG and we referred to Cards for further eval (Myoview was neg); note that her husb died of sudden cardiac death in 04/11/07; now on Lisin20-1/2 & we added MetopER50-1/2...  HBP>  See above...  Headaches>  Prev thorough eval by DrLewitt & she has been well controlled on regimen of Topamax, Imipramine, Imitrex & she requests refills from [place]...  Insomnia>  Stable on Ambien for her CPI problem...  Anxiety>  Mother in [place], doesn't want anxiolytic Rx...   Patient's Medications  New Prescriptions   METOPROLOL SUCCINATE (TOPROL-XL) 50 MG 24 HR TABLET    Take 1/2 tablet by mouth daily.  Take with or immediately following a meal.  Previous Medications   ASPIRIN 81 MG TABLET    Take 81 mg by mouth daily.   CHOLESTYRAMINE (QUESTRAN) 4 G PACKET    Take 1 packet by mouth daily as needed.   DIPHENHYD-HYDROCORT-NYSTATIN (FIRST-DUKES MOUTHWASH) SUSP    Gargle and swallow 1 tsp four times daily   HYDROCHLOROTHIAZIDE (HYDRODIURIL) 25 MG TABLET    TAKE ONE TABLET BY MOUTH EVERY DAY   IBUPROFEN (ADVIL,MOTRIN) 200 MG TABLET    Take 200 mg by mouth every 6 (six) hours as needed.     SUMATRIPTAN (IMITREX) 100 MG TABLET    Take 1 tablet (100 mg total) by mouth once as needed for migraine.   TOPIRAMATE (TOPAMAX) 100 MG TABLET    Take 1 tablet (100 mg total) by mouth 2 (two) times daily.   ZOLPIDEM (AMBIEN) 10 MG TABLET     Take 1 tablet (10 mg total) by mouth at bedtime as needed.  Modified Medications   Modified Medication Previous Medication   IMIPRAMINE (TOFRANIL) 50 MG TABLET imipramine (TOFRANIL) 50 MG tablet      TAKE TWO TABLETS BY MOUTH EVERY DAY    TAKE TWO TABLETS BY MOUTH EVERY DAY   LISINOPRIL (PRINIVIL,ZESTRIL) 20 MG TABLET lisinopril (PRINIVIL,ZESTRIL) 20 MG tablet      Take 0.5 tablets (10 mg total) by mouth daily.    Take 1 tablet (20 mg total) by mouth daily.  Discontinued Medications   AZITHROMYCIN (ZITHROMAX) 250 MG TABLET    Take as directed   LISINOPRIL (PRINIVIL,ZESTRIL) 20 MG TABLET    Take 10 mg by mouth daily.

## 2014-02-09 ENCOUNTER — Ambulatory Visit: Payer: BC Managed Care – PPO | Admitting: Pulmonary Disease

## 2014-02-13 NOTE — Progress Notes (Signed)
Quick Note:  Called spoke with patient, advised of lab results / recs as stated by TP. Pt verbalized her understanding and denied any questions. ______ 

## 2014-03-23 ENCOUNTER — Telehealth: Payer: Self-pay | Admitting: Pulmonary Disease

## 2014-03-23 NOTE — Telephone Encounter (Signed)
Per SN: recs to increase lisinopril to 20 mg QD If BP still not better after increase of lisinopril then increase metoprolol 50 mg QD.   Called made pt aware. Nothing further needed

## 2014-03-23 NOTE — Telephone Encounter (Signed)
I called spoke with pt. She reports her BP has been up and down. Monday -- 124/80 Tuesday--170/110 Thursday--150/89 She takes Metoprolol 50 mg 1/2 QD and lisinopril 20 mg 1/2 QD. Pt has not found new PCP yet. Please advise SN thanks

## 2014-04-20 ENCOUNTER — Telehealth: Payer: Self-pay | Admitting: Pulmonary Disease

## 2014-04-20 MED ORDER — TOPIRAMATE 100 MG PO TABS: 100.0000 mg | ORAL_TABLET | Freq: Two times a day (BID) | ORAL | Status: DC

## 2014-04-20 MED ORDER — LISINOPRIL 20 MG PO TABS: 20.0000 mg | ORAL_TABLET | Freq: Every day | ORAL | Status: DC

## 2014-04-20 NOTE — Telephone Encounter (Signed)
Called and spoke with pt and she stated that SN increased her lisinopril back in may to 20mg   1 daily.   Pt requested refill of this to be sent in to the local pharmacy.  This has been done.    Pt also stated that she will need refill of the topamax to be sent in to the mail order for her.  This has been done and nothing further is needed.

## 2014-04-25 ENCOUNTER — Ambulatory Visit: Payer: BC Managed Care – PPO | Admitting: Physician Assistant

## 2014-05-03 ENCOUNTER — Other Ambulatory Visit: Payer: Self-pay | Admitting: Emergency Medicine

## 2014-05-03 MED ORDER — METOPROLOL SUCCINATE ER 50 MG PO TB24: ORAL_TABLET | ORAL | Status: DC

## 2014-06-01 ENCOUNTER — Other Ambulatory Visit: Payer: Self-pay | Admitting: Pulmonary Disease

## 2014-06-01 MED ORDER — LISINOPRIL 20 MG PO TABS: 20.0000 mg | ORAL_TABLET | Freq: Every day | ORAL | Status: DC

## 2014-06-01 MED ORDER — IMIPRAMINE HCL 50 MG PO TABS: ORAL_TABLET | ORAL | Status: DC

## 2014-06-06 ENCOUNTER — Telehealth: Payer: Self-pay | Admitting: Pulmonary Disease

## 2014-06-06 NOTE — Telephone Encounter (Signed)
Called spoke with pt. She reports her BP has been 182/109. Reports BP has been slowing rising x 1 month, Pt reports she takes lisinopril 20 mg QD, toprol 50 mg 1/2 tab daily. Pt reports she has HCTZ 25 mg on hand. Wants to know if she should start this? Please advise SN thanks

## 2014-06-07 NOTE — Telephone Encounter (Signed)
Spoke with the pt  She states that she does already have the HCTZ and does not need us to call in rx for her  I changed her ov from 06/22/14 to 06/28/14 per her request  Nothing further needed

## 2014-06-07 NOTE — Telephone Encounter (Signed)
Per SN---  Continue the lisinopril 20 mg daily Increase the toprol xl 50 mg to 1 daily Restart hctz 25 mg  1 daily ROV with SN in 1 month or so for follow up.  thanks

## 2014-06-07 NOTE — Telephone Encounter (Signed)
Spoke with the pt and notified of recs per SN She verbalized understanding  Appt with SN scheduled for 06/22/14 per her request, since school is starting back  She states that she thinks that she still has some HCTZ and does not want to have this sent in yet  She will call back today to let us know if we need to send  Will hold in triage

## 2014-06-22 ENCOUNTER — Ambulatory Visit: Payer: BC Managed Care – PPO | Admitting: Pulmonary Disease

## 2014-06-27 ENCOUNTER — Encounter: Payer: Self-pay | Admitting: Pulmonary Disease

## 2014-06-27 ENCOUNTER — Ambulatory Visit (INDEPENDENT_AMBULATORY_CARE_PROVIDER_SITE_OTHER): Payer: BC Managed Care – PPO | Admitting: Pulmonary Disease

## 2014-06-27 VITALS — BP 104/68 | HR 70 | Temp 97.8°F | Ht 62.0 in | Wt 160.8 lb

## 2014-06-27 DIAGNOSIS — G4700 Insomnia, unspecified: Secondary | ICD-10-CM

## 2014-06-27 DIAGNOSIS — F3289 Other specified depressive episodes: Secondary | ICD-10-CM

## 2014-06-27 DIAGNOSIS — I10 Essential (primary) hypertension: Secondary | ICD-10-CM

## 2014-06-27 DIAGNOSIS — R072 Precordial pain: Secondary | ICD-10-CM

## 2014-06-27 DIAGNOSIS — M722 Plantar fascial fibromatosis: Secondary | ICD-10-CM

## 2014-06-27 DIAGNOSIS — R51 Headache: Secondary | ICD-10-CM

## 2014-06-27 DIAGNOSIS — F329 Major depressive disorder, single episode, unspecified: Secondary | ICD-10-CM

## 2014-06-27 MED ORDER — ESCITALOPRAM OXALATE 10 MG PO TABS: 10.0000 mg | ORAL_TABLET | Freq: Every day | ORAL | Status: DC

## 2014-06-27 NOTE — Patient Instructions (Signed)
Today we updated your med list in our EPIC system...    We decided to decrease the HCTZ 25mg  to 1/2 tab each AM...    We will add LEXAPRO 10mg  daily per our discussion...  We will arrange for a Cardiac follow up visit for your persistent CP & labile BP...  Let's plan a follow up visit w/ CPX in 6 months- make an AM appt & come fasting that day for blood work.Marland Kitchen..Marland Kitchen

## 2014-06-27 NOTE — Progress Notes (Signed)
Subjective:    Patient ID: Kelli PebblesRejeana F Contreras, female    DOB: 1951/05/22, 63 y.o.   MRN: [id number]  HPI 63 y/o WF here for a follow up visit...  SEE PREV EPIC NOTES FOR OLDER DATA >>   ~  May 23,2012:  Yearly ROV & she requests refill prescriptions... She hasn't had CPX- labs, Xray, EKG, etc- due to no insurance & requests that we don't do these important screening procedures at this time... She states BP controlled on meds;  HAs doing satis on her maintenance HA prescriptions;  Still needs the Ambien for her chronic persistant insomnia, but at least it works well & is Tree surgeontol satis...  ~  August 30, 2012:  67mo ROV & Kelli Contreras presents c/o several month hx SS CP that appears to come on w/ activity & go away w/ rest; it's been present for several months & has recently incr frequency to ~1/wk or so; it comes on w/ activ, feels like a dull pressure in chest, radiates out to the arms, lasts <685min & resolves spont w/ rest; it is occas assoc w/ SOB  & she has had some dizziness but she denies palpit, syncope, edema...  As prev noted she has NEVER had a baseline CXR, EKG, Labs as she has not had insurance & refused CPX etc... Now she has insurance coverage through a new job & wants to get this evaluated... Known HBP, nonsmoker, but we don't have baseline Lipids or BS...    Cricopharyngeus dysfunction>  Notes occas episodes of throat closing, can't breathe, can't get the air in, face feels numb; offered Klonopin Rx but she says rare episodes & wants to hold off...    CP>  Presented 10/13 w/ SSCP and abn EKG==> meds adjusted (decr Lisinopril & added ASA, MetopER), referred to Cards for further eval...    HBP>  Controlled on Lisin20Bid, Hct25; BP= 112/80, not checking at home; denies recent HA, palpit, SOB, edema...    HxDiarrhea>  On Questran but only uses this as needed & averages <1/month...    HxMigraines>  On Imipramine100, Topamax100Bid, Imitrex 100mg  prn; she has been out of the Imitrex for along time due to  cost, using OTC analgesics as needed...    Chronic Insomnia>  On Ambien 10mg  Qhs as needed for sleep...    Anxiety>  Under a lot of stress- mother has moved in w/ her, no currently on an anxiolytic rx... We reviewed prob list, meds, xrays and labs> see below for updates >>   CXR 10/13 showed norm heart size, clear lungs, osteopenic appearing bones...  EKG 10/13 showed NSR, rate 85, IVCD, poor R progression V1-3...  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    CP>  Presented 10/13 w/ SSCP and abn EKG==> meds adjusted (decr Lisinopril & added ASA, MetopER), referred to Cards for further eval & seen by DrHochrein=> Myoview 12/13 was wnl...    HBP>  meds adjusted 3/15 due to low BP, then grad added back, on Lisin20-1/2 daily now; BP= 122/80, but higher at home w/ some palpit & we decided to add MetopER50- 1/2 tab daily to start...    HxDiarrhea>  On Questran but only uses this as needed & averages <1/month...    HxMigraines>  On Imipramine100, Topamax100Bid, Imitrex 100mg  prn; she has been out of the Imitrex for along time due to  cost, using OTC analgesics as needed...    Chronic Insomnia>  On Ambien 10mg  Qhs as needed for sleep...    Anxiety>  Under a lot of stress- mother in [place], helps w/ grandkids, long days, etc... She doesn't want anxiolytic... We reviewed prob list, meds, xrays and labs> see below for updates >>   CXR 3/15 showed norm heart size, minor scarring in lingula, otherw clear, NAD.Marland KitchenMarland Kitchen  EKG 3/15 showed NSR, rate=93, low volt percod leads, NAD...  LABS 3/15 reviewed> Chems- ok w/ Cr from 1.3=>0.8, CBC- wnl...   ~  June 27, 2014:  4-83mo ROV & Kelli Contreras's BP has been up & down at home> she called 5/15 w/ BP up to 150-170 sys and we increased her Lisinopril to 20mg /d; after initial improvement, she noted BP up further & she called 7/15 w/ BP up to 180sys so we increased her MetoprololER to 50mg /d & added back her prev HCTZ25mg  Qam... She returns today w/ BP reading dropping to 90/60 & she has been experiencing CP, weakness, nausea & really bad episode lasting ~61min 2d ago w/ radiation to her left arm; this has her scared as she recalls her husb who had a neg Myoview & subseq died w/ a heart attack within the yr...     HBP> now on MetopER50, Lisin20, HCT25 and BP= 104/68 w/ recent CP etc as above;  We decided to decr the HCT to 1/2 tab Qam & she will continue to monitor BP carefully at home...    CP> on ASA81; she is very concerned as noted;  We discussed referral back to Cards for their review & to consider Cath to delineate her anatomy...    Hx Migraines> they are controlled on Topamax100Bid, Imipramine100, Imitrex100 prn...    Dysthymia> she has declined Klonopin rx in the past; thinks she was doing better when she was on Lexapro in the past & we agreed to start 10mg  daily...  We reviewed prob list, meds, xrays and labs> see below for updates >>            Problem List:  HYPERTENSION (ICD-401.9) >>  ~  Prev controlled on LISINOPRIL 40mg /d, and HCTZ 25mg /d...  ~  5/12:  BP= 138/84> she tolerates meds well,  takes regularly & home BP checks are OK when not in pain; denies fatigue, visual changes, CP, palipit, dizziness, syncope, dyspnea, edema, etc... ~  10/13:  BP= 112/80 on Lisin40 & Hct25; presented w/ CP/ SOB & we decided to decr Lisin20 & add MetopER50, refer to Cards... ~  3/15: she was sent to the ER w/ lowBP for IVF & mult meds were held> subseq restarted  Lisin10mg /d; and MetopER50-1/2 tab added 4/15...  ~  2023-07-16: now on MetopER50, Lisin20, HCT25 and BP= 104/68 w/ recent CP etc as above;  We decided to decr the HCT to 1/2 tab Qam & she will continue to monitor BP carefully at home.  CHEST PAIN >> ~  10/13:  See above - referred to Cards ~  11/13: she had f/u DrHochrein> c/o chest discomfort, remote heart cath w/o CAD, his note is reviewed; Myoview done 12/13 w/ low level exercise & dyspnea, norm BP response, no ischemia, norm wall motion, EF=70%; He rec BP control & LDL<100... ~  CXR 3/15 showed norm heart size, minor scarring in lingula, otherw clear, NAD... ~  EKG 3/15 showed NSR, rate=93, low volt percod leads, NAD.. ~  2023-07-16: on ASA81; she is very concerned as noted;  We discussed referral back to Cards for their review & to consider Cath to delineate her anatomy.  Hx of DIARRHEA (ICD-787.91) - on QUESTRAN 4gm packet daily as needed... this really helps her prev chr diarrhea problem and she feels it has been a life-saver! ~  10/13:  She notes that she only uses the Questran about once per month now, doing very well w/o recurrent diarrhea...  Hx of PLANTAR FASCIITIS (ICD-728.71) - Eval and Rx by DrBednarz in past... she uses Ibuprofen Prn.  Hx of HEADACHE (ICD-784.0) - eval by DrLewitt and treated w/ TOPAMAX 100mg /d, IMIPRAMINE 50mg - 2daily, & IMITREX 100mg Prn... she still gets HA's but feels that they are manageable w/ this regimen...  She requests refill Rx from [place] due to cost of seeing DrLewitt & the fact that she has been well controlled on a stable med regimen...  Hx of DEPRESSION  (ICD-311) - prev on Lexapro perscribed by DrLomax... she feels that it helped but ran out & doing OK since then (her husb died of sudden cardiac death 12/08/2022)... ~  07-16-2023: she has declined Klonopin rx in the past; thinks she was doing better when she was on Lexapro in the past & we agreed to start 10mg  daily...    INSOMNIA, CHRONIC (ICD-307.42) - she has chronic persistant insomnia treated w/ ZOLPIDEM 10mg  Qhs...  We discussed alternative OTC Rx w/ Melatonin & TylenolPM.  Health Maintenance:  she had full labs from [place] in 2004, and from DrLomax in 2007 & all WNL.Marland Kitchen. ~  GYN= DrLomax w/ hysterectomy in 1985... on VIVELLE patch... he does BMD's w/ report of Osteopenia 8/07... ~  GI = DrPerry w/ colonoscopy 12/07 that was WNL... f/u planned 83yrs. ~  Immunizations:  she gets the yearly Flu vaccine.   Past Surgical History  Procedure Laterality Date  . Vesicovaginal fistula closure w/ tah  1985    Dr. Nicholas Lose  . Bunionectomy  2007    Dr. Lestine Box    Outpatient Encounter Prescriptions as of 06/27/2014  Medication Sig  . aspirin 81 MG tablet Take 81 mg by mouth daily.  . Diphenhyd-Hydrocort-Nystatin (FIRST-DUKES MOUTHWASH) SUSP Gargle and swallow 1 tsp four times daily  . hydrochlorothiazide (HYDRODIURIL) 25 MG tablet TAKE ONE TABLET BY MOUTH EVERY DAY  . ibuprofen (ADVIL,MOTRIN) 200 MG tablet Take 200 mg by mouth every 6 (six) hours as needed.    Marland Kitchen imipramine (TOFRANIL) 50 MG tablet TAKE TWO TABLETS BY MOUTH EVERY DAY  . lisinopril (PRINIVIL,ZESTRIL) 20 MG tablet Take 1 tablet (20 mg total) by mouth daily.  . metoprolol succinate (TOPROL-XL) 50 MG 24 hr tablet Take 50 mg by mouth daily. Take with or immediately following a  meal.  . topiramate (TOPAMAX) 100 MG tablet Take 1 tablet (100 mg total) by mouth 2 (two) times daily.  Marland Kitchen zolpidem (AMBIEN) 10 MG tablet Take 5 mg by mouth at bedtime as needed.  . [DISCONTINUED] metoprolol succinate (TOPROL-XL) 50 MG 24 hr tablet Take 1/2 tablet by mouth daily.   Take with or immediately following a meal.  . [DISCONTINUED] zolpidem (AMBIEN) 10 MG tablet Take 1 tablet (10 mg total) by mouth at bedtime as needed.  . cholestyramine (QUESTRAN) 4 G packet Take 1 packet by mouth daily as needed.  . SUMAtriptan (IMITREX) 100 MG tablet Take 1 tablet (100 mg total) by mouth once as needed for migraine.    Allergies not on file >> NKDA...   Review of Systems         See HPI - all other systems neg except as noted... The patient complains of headaches.  The patient denies anorexia, fever, weight loss, weight gain, vision loss, decreased hearing, hoarseness, chest pain, syncope, dyspnea on exertion, peripheral edema, prolonged cough, hemoptysis, abdominal pain, melena, hematochezia, severe indigestion/heartburn, hematuria, incontinence, muscle weakness, suspicious skin lesions, transient blindness, difficulty walking, depression, unusual weight change, abnormal bleeding, enlarged lymph nodes, and angioedema.     Objective:   Physical Exam     WD, WN, 63 y/o WF in NAD... GENERAL:  Alert & oriented; pleasant & cooperative... HEENT:  [place]/AT, EOM-wnl, PERRLA, EACs-clear, TMs-wnl, NOSE-clear, THROAT-clear & wnl. NECK:  Supple w/ full ROM; no JVD; normal carotid impulses w/o bruits; no thyromegaly or nodules palpated; no lymphadenopathy. CHEST:  Clear to P & A; without wheezes/ rales/ or rhonchi. HEART:  Regular Rhythm; without murmurs/ rubs/ or gallops. ABDOMEN:  Soft & nontender; normal bowel sounds; no organomegaly or masses detected. EXT: without deformities or arthritic changes; no varicose veins/ venous insuffic/ or edema. NEURO:  CN's intact; motor testing normal; sensory testing normal; gait normal & balance OK. DERM:  No lesions noted; no rash etc...  RADIOLOGY DATA:  Reviewed in the EPIC EMR & discussed w/ the patient...  LABORATORY DATA:  Reviewed in the EPIC EMR & discussed w/ the patient...   Assessment & Plan:    HBP>  We have made several  adjustments & today will decr the HCT to 12.5mg /d; continue to monitor the BP at home...  Chest Pain>  She has indeterminate CP w/ an abn EKG and was referred to Cards for further eval (Myoview was neg); note that her husb died of sudden cardiac death in 04-03-2007; she is scared 7 we will ask Cards to recheck- ?consider cath...  Headaches>  Prev thorough eval by DrLewitt & she has been well controlled on regimen of Topamax, Imipramine, Imitrex & she requests refills from [place]...  Insomnia>  Stable on Ambien for her CPI problem...  Anxiety/ Dysthymia>  She does not want anxiolytic rx, but we will add Lexapro10 which helped her in the past...   Patient's Medications  New Prescriptions   No medications on file  Previous Medications   ASPIRIN 81 MG TABLET    Take 81 mg by mouth daily.   CHOLESTYRAMINE (QUESTRAN) 4 G PACKET    Take 1 packet by mouth daily as needed.   DIPHENHYD-HYDROCORT-NYSTATIN (FIRST-DUKES MOUTHWASH) SUSP    Gargle and swallow 1 tsp four times daily   IBUPROFEN (ADVIL,MOTRIN) 200 MG TABLET    Take 200 mg by mouth every 6 (six) hours as needed.     IMIPRAMINE (TOFRANIL) 50 MG TABLET    TAKE TWO  TABLETS BY MOUTH EVERY DAY   LISINOPRIL (PRINIVIL,ZESTRIL) 20 MG TABLET    Take 1 tablet (20 mg total) by mouth daily.   SUMATRIPTAN (IMITREX) 100 MG TABLET    Take 1 tablet (100 mg total) by mouth once as needed for migraine.   TOPIRAMATE (TOPAMAX) 100 MG TABLET    Take 1 tablet (100 mg total) by mouth 2 (two) times daily.  Modified Medications   Modified Medication Previous Medication   ESCITALOPRAM (LEXAPRO) 10 MG TABLET escitalopram (LEXAPRO) 10 MG tablet      Take 1 tablet (10 mg total) by mouth daily.    Take 10 mg by mouth daily.   HYDROCHLOROTHIAZIDE (HYDRODIURIL) 25 MG TABLET hydrochlorothiazide (HYDRODIURIL) 25 MG tablet      TAKE 1/2 TABLET BY MOUTH EVERY DAY    TAKE ONE TABLET BY MOUTH EVERY DAY   METOPROLOL SUCCINATE (TOPROL-XL) 50 MG 24 HR TABLET metoprolol succinate  (TOPROL-XL) 50 MG 24 hr tablet      Take 50 mg by mouth daily. Take with or immediately following a meal.    Take 1/2 tablet by mouth daily.  Take with or immediately following a meal.   ZOLPIDEM (AMBIEN) 10 MG TABLET zolpidem (AMBIEN) 10 MG tablet      Take 5 mg by mouth at bedtime as needed.    Take 1 tablet (10 mg total) by mouth at bedtime as needed.  Discontinued Medications   No medications on file

## 2014-06-28 ENCOUNTER — Ambulatory Visit: Payer: BC Managed Care – PPO | Admitting: Pulmonary Disease

## 2014-06-29 ENCOUNTER — Telehealth: Payer: Self-pay | Admitting: Pulmonary Disease

## 2014-06-29 NOTE — Telephone Encounter (Signed)
Per SN---  He will fill these forms out and we will send these in for her.  Called and spoke with pt and she requested that a copy be mailed to her.  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 She stated that this would be way too stressful.  Will make SN aware.

## 2014-06-29 NOTE — Telephone Encounter (Signed)
Paper work placed on SN cart to see if he can fill this out without the pt in the office.  Will call pt once this has been decided.

## 2014-06-29 NOTE — Telephone Encounter (Signed)
Spoke with pt. She reports she spoke with SN about paperwork she needs filled out. She will bring this by later today. FYI for leigh and SN

## 2014-06-29 NOTE — Telephone Encounter (Signed)
Pt left forms doe SN to fill out. [facility]

## 2014-07-02 ENCOUNTER — Ambulatory Visit: Payer: BC Managed Care – PPO | Admitting: Cardiology

## 2014-07-02 NOTE — Telephone Encounter (Signed)
Copy of DMV forms have been put in the scan folder to scan into the pts chart.  A copy has been mailed to the pt.  i have faxed the original to the [facility] and placed this in the mail to them. Pt is aware.

## 2014-07-04 ENCOUNTER — Ambulatory Visit: Payer: BC Managed Care – PPO | Admitting: Cardiology

## 2014-07-12 ENCOUNTER — Telehealth: Payer: Self-pay | Admitting: Pulmonary Disease

## 2014-07-12 NOTE — Telephone Encounter (Signed)
LMTCBx1 to verify meds needed. Carron Curie, CMA

## 2014-07-12 NOTE — Telephone Encounter (Signed)
Pt is requesting a reifll on the following:   metoprolol succinate (TOPROL-XL) 50 MG 24 hr tablet.  Pt states doc told her to start taking a whole pill since last visit.  zolpidem (AMBIEN) 10 MG tablet   Please send 90 day refills to [facility].  Antionette Fairy

## 2014-07-12 NOTE — Telephone Encounter (Signed)
SN, please advise if okay to refill [employer] like you have never filled this for her Last ov 06/27/14  Next ov is with new PCP 08/17/14- Dr Dorise Hiss  Please advise, thanks!

## 2014-07-17 MED ORDER — ZOLPIDEM TARTRATE 10 MG PO TABS: 5.0000 mg | ORAL_TABLET | Freq: Every evening | ORAL | Status: DC | PRN

## 2014-07-17 MED ORDER — METOPROLOL SUCCINATE ER 50 MG PO TB24: 50.0000 mg | ORAL_TABLET | Freq: Every day | ORAL | Status: DC

## 2014-07-17 NOTE — Telephone Encounter (Signed)
Per SN---  Ok for the refills Metoprolol 50 mg  1 daily   #90 with 0 refills ambien 10 mg  #90 with 0 refills.

## 2014-07-17 NOTE — Telephone Encounter (Signed)
Pt aware. Nothing further needed at this time.  °

## 2014-07-17 NOTE — Telephone Encounter (Signed)
RX sent to [facility]. Called pt LMTCB x1

## 2014-08-17 ENCOUNTER — Ambulatory Visit: Payer: BC Managed Care – PPO | Admitting: Internal Medicine

## 2014-09-24 ENCOUNTER — Other Ambulatory Visit: Payer: Self-pay | Admitting: Pulmonary Disease

## 2014-10-17 ENCOUNTER — Other Ambulatory Visit: Payer: Self-pay | Admitting: *Deleted

## 2014-10-17 MED ORDER — HYDROCHLOROTHIAZIDE 25 MG PO TABS: ORAL_TABLET | ORAL | Status: DC

## 2014-10-30 ENCOUNTER — Telehealth: Payer: Self-pay | Admitting: Pulmonary Disease

## 2014-10-30 MED ORDER — CIPROFLOXACIN HCL 250 MG PO TABS: 250.0000 mg | ORAL_TABLET | Freq: Two times a day (BID) | ORAL | Status: DC

## 2014-10-30 NOTE — Telephone Encounter (Signed)
Per SN---  cipro 250 mg  #14  1 po bid otc align once daily Increase fluids thanks

## 2014-10-30 NOTE — Telephone Encounter (Signed)
Pt c/o possible bladder infection - pain with urination, frequency x 2 days.  Little blood in urine this morning.  Pt denies trying OTC treatment for symptoms.  Requests Cipro abx be called into [facility]  Please advise Dr Kriste BasqueNadel. Thanks.  Allergies  Allergen Reactions  . Other     Onions & Chocolate cause migraine   Current Outpatient Prescriptions on File Prior to Visit  Medication Sig Dispense Refill  . aspirin 81 MG tablet Take 81 mg by mouth daily.    . cholestyramine (QUESTRAN) 4 G packet Take 1 packet by mouth daily as needed. 90 each 3  . Diphenhyd-Hydrocort-Nystatin (FIRST-DUKES MOUTHWASH) SUSP Gargle and swallow 1 tsp four times daily 240 mL 0  . escitalopram (LEXAPRO) 10 MG tablet Take 1 tablet (10 mg total) by mouth daily. 90 tablet 3  . hydrochlorothiazide (HYDRODIURIL) 25 MG tablet TAKE 1/2 TABLET BY MOUTH EVERY DAY 45 tablet 1  . ibuprofen (ADVIL,MOTRIN) 200 MG tablet Take 200 mg by mouth every 6 (six) hours as needed.      Marland Kitchen. imipramine (TOFRANIL) 50 MG tablet TAKE TWO TABLETS BY MOUTH EVERY DAY 180 tablet 1  . lisinopril (PRINIVIL,ZESTRIL) 20 MG tablet Take 1 tablet (20 mg total) by mouth daily. 90 tablet 1  . metoprolol succinate (TOPROL-XL) 50 MG 24 hr tablet Take 1 tablet (50 mg total) by mouth daily. Take with or immediately following a meal. 90 tablet 0  . SUMAtriptan (IMITREX) 100 MG tablet Take 1 tablet (100 mg total) by mouth once as needed for migraine. 30 tablet 5  . topiramate (TOPAMAX) 100 MG tablet TAKE 1 TABLET TWICE A DAY 180 tablet 0  . zolpidem (AMBIEN) 10 MG tablet Take 0.5 tablets (5 mg total) by mouth at bedtime as needed. 90 tablet 0   No current facility-administered medications on file prior to visit.

## 2014-10-30 NOTE — Telephone Encounter (Signed)
Called and spoke to pt. Informed pt of the recs per SN. Rx sent to preferred pharmacy. Pt verbalized understanding and denied any further questions or concerns at this time.  

## 2014-11-14 ENCOUNTER — Other Ambulatory Visit: Payer: Self-pay | Admitting: Pulmonary Disease

## 2014-12-04 ENCOUNTER — Other Ambulatory Visit: Payer: Self-pay | Admitting: Pulmonary Disease

## 2014-12-05 ENCOUNTER — Telehealth: Payer: Self-pay | Admitting: Pulmonary Disease

## 2014-12-05 NOTE — Telephone Encounter (Signed)
Refill has been sent in today already for this medication.  i called and spoke with pt and she is aware of this and nothing further is needed.

## 2014-12-23 ENCOUNTER — Other Ambulatory Visit: Payer: Self-pay | Admitting: Pulmonary Disease

## 2015-02-01 ENCOUNTER — Other Ambulatory Visit: Payer: Self-pay | Admitting: Pulmonary Disease

## 2015-02-18 ENCOUNTER — Ambulatory Visit (INDEPENDENT_AMBULATORY_CARE_PROVIDER_SITE_OTHER): Payer: BC Managed Care – PPO | Admitting: Family Medicine

## 2015-02-18 ENCOUNTER — Encounter: Payer: Self-pay | Admitting: Family Medicine

## 2015-02-18 VITALS — BP 124/82 | HR 73 | Temp 98.4°F | Resp 16 | Ht 62.0 in | Wt 159.4 lb

## 2015-02-18 DIAGNOSIS — G4700 Insomnia, unspecified: Secondary | ICD-10-CM

## 2015-02-18 DIAGNOSIS — G43009 Migraine without aura, not intractable, without status migrainosus: Secondary | ICD-10-CM | POA: Diagnosis not present

## 2015-02-18 DIAGNOSIS — Z131 Encounter for screening for diabetes mellitus: Secondary | ICD-10-CM | POA: Diagnosis not present

## 2015-02-18 DIAGNOSIS — R0789 Other chest pain: Secondary | ICD-10-CM

## 2015-02-18 DIAGNOSIS — F329 Major depressive disorder, single episode, unspecified: Secondary | ICD-10-CM

## 2015-02-18 DIAGNOSIS — J301 Allergic rhinitis due to pollen: Secondary | ICD-10-CM | POA: Diagnosis not present

## 2015-02-18 DIAGNOSIS — J029 Acute pharyngitis, unspecified: Secondary | ICD-10-CM

## 2015-02-18 DIAGNOSIS — K589 Irritable bowel syndrome without diarrhea: Secondary | ICD-10-CM | POA: Diagnosis not present

## 2015-02-18 DIAGNOSIS — F32A Depression, unspecified: Secondary | ICD-10-CM

## 2015-02-18 DIAGNOSIS — I10 Essential (primary) hypertension: Secondary | ICD-10-CM

## 2015-02-18 LAB — POCT URINALYSIS DIPSTICK
BILIRUBIN UA: NEGATIVE
GLUCOSE UA: NEGATIVE
Ketones, UA: NEGATIVE
Nitrite, UA: NEGATIVE
PROTEIN UA: NEGATIVE
Spec Grav, UA: 1.015
Urobilinogen, UA: 1
pH, UA: 6

## 2015-02-18 LAB — POCT UA - MICROSCOPIC ONLY
CASTS, UR, LPF, POC: NEGATIVE
Crystals, Ur, HPF, POC: NEGATIVE
Mucus, UA: NEGATIVE
YEAST UA: NEGATIVE

## 2015-02-18 MED ORDER — LISINOPRIL 20 MG PO TABS: 20.0000 mg | ORAL_TABLET | Freq: Every day | ORAL | Status: DC

## 2015-02-18 MED ORDER — FLUTICASONE PROPIONATE 50 MCG/ACT NA SUSP: 2.0000 | Freq: Every day | NASAL | Status: DC

## 2015-02-18 MED ORDER — HYDROCHLOROTHIAZIDE 25 MG PO TABS: ORAL_TABLET | ORAL | Status: DC

## 2015-02-18 MED ORDER — TOPIRAMATE 100 MG PO TABS: 100.0000 mg | ORAL_TABLET | Freq: Two times a day (BID) | ORAL | Status: DC

## 2015-02-18 MED ORDER — ZOLPIDEM TARTRATE 10 MG PO TABS: 10.0000 mg | ORAL_TABLET | Freq: Every evening | ORAL | Status: DC | PRN

## 2015-02-18 MED ORDER — METOPROLOL SUCCINATE ER 50 MG PO TB24: ORAL_TABLET | ORAL | Status: DC

## 2015-02-18 MED ORDER — SUMATRIPTAN SUCCINATE 100 MG PO TABS: 100.0000 mg | ORAL_TABLET | Freq: Once | ORAL | Status: DC | PRN

## 2015-02-18 MED ORDER — ESCITALOPRAM OXALATE 10 MG PO TABS: 15.0000 mg | ORAL_TABLET | Freq: Every day | ORAL | Status: DC

## 2015-02-18 MED ORDER — IMIPRAMINE HCL 50 MG PO TABS: 100.0000 mg | ORAL_TABLET | Freq: Every day | ORAL | Status: DC

## 2015-02-18 NOTE — Progress Notes (Signed)
Subjective:    Patient ID: Kelli Contreras, female    DOB: 1951-05-01, 64 y.o.   MRN: [id number]  02/18/2015  estab care; hard to swallow x 1 week; and tired   HPI This 64 y.o. female presents to establish care and for the following:  Last physical:  Several years ago. 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 Mammogram:  2012 Colonoscopy:  2009 [place]; normal; no polyps. Bone density:  Not yet. TDAP:  never Pneumovax:  never Influenza:  Never; refuses Eye exam:  +glasses; yearly.  07/2014 Dental exam:  This week; cracked a tooth.   Followed by Kriste Basque every six months.    Dysphagia: onset pain with swallowing; +PND; +nasal congestion; taking generic allergy medication.  No heartburn or indigestion; no fever/chills/sweats.  No worse at night.    Tongue laceration:  Some better.  Broke off tooth on L lower side eating popcorn last week.  Cut tongue in process.  S/p evaluation by dentist.  Tongue is slow to heal.    HTN: Patient reports good compliance with medication, good tolerance to medication, and good symptom control.  Increased HCTZ to one whole tablet on own due to elevated BP readings.  +chronic exertional chest pain for years; s/p stress test in past three years that was negative; no worsening chest pain since onset.  Only occurs with exertion.  Not interested in undergoing cardiology evaluation.  No diaphoresis or shortness of breath with chest pain.  Anxiety and depression: increased Lexapro to one whole tablet of  in past few months; stays very busy; working as Geologist, engineering; also caring for elderly mother; also helps with grandson a lot.  Does not have time to be down and cry.  Denies SI/HI. "I don't have time for things like that".  Very stressful life.    IBS Diarrhea: Chronic issue; uses Questran PRN.  Insomnia: uses Imipramine qhs for insomnia; also uses Ambien PRN.  Needs refills.    Migraines: chronic issue for patient.   Takes Topamax bid; also uses Imitrex PRN with good results.  Stress and chocolate are triggers.   Review of Systems  Constitutional: Negative for fever, chills, diaphoresis and fatigue.  HENT: Positive for congestion, postnasal drip, rhinorrhea, sore throat and trouble swallowing. Negative for ear pain, sinus pressure and voice change.   Eyes: Negative for visual disturbance.  Respiratory: Negative for cough and shortness of breath.   Cardiovascular: Positive for chest pain. Negative for palpitations and leg swelling.  Gastrointestinal: Positive for diarrhea. Negative for nausea, vomiting, abdominal pain, constipation, abdominal distention, anal bleeding and rectal pain.  Endocrine: Negative for cold intolerance, heat intolerance, polydipsia, polyphagia and polyuria.  Musculoskeletal: Positive for arthralgias. Negative for back pain.  Neurological: Negative for dizziness, tremors, seizures, syncope, facial asymmetry, speech difficulty, weakness, light-headedness, numbness and headaches.  Psychiatric/Behavioral: Positive for sleep disturbance and dysphoric mood. Negative for suicidal ideas and self-injury. The patient is nervous/anxious.     Past Medical History  Diagnosis Date  . Hypertension   . Diarrhea   . Plantar fasciitis   . Depression   . Insomnia   . Cataract   . IBS (irritable bowel syndrome)     diarrhea  . Migraine    Past Surgical History  Procedure Laterality Date  . Vesicovaginal fistula closure w/ tah  1985    Dr. Nicholas Lose  . Bunionectomy  2007    Dr. Lestine Box  . Abdominal hysterectomy  DUB; ovaries intact.   Allergies  Allergen Reactions  . Other     Onions & Chocolate cause migraine   Current Outpatient Prescriptions  Medication Sig Dispense Refill  . aspirin 81 MG tablet Take 81 mg by mouth daily.    . cholestyramine (QUESTRAN) 4 G packet Take 1 packet by mouth daily as needed. 90 each 3  . escitalopram (LEXAPRO) 10 MG tablet Take 1.5 tablets (15 mg  total) by mouth daily. 135 tablet 3  . hydrochlorothiazide (HYDRODIURIL) 25 MG tablet TAKE 1 TABLET BY MOUTH EVERY DAY 90 tablet 3  . ibuprofen (ADVIL,MOTRIN) 200 MG tablet Take 200 mg by mouth every 6 (six) hours as needed.      Marland Kitchen imipramine (TOFRANIL) 50 MG tablet Take 2 tablets (100 mg total) by mouth daily. 180 tablet 1  . lisinopril (PRINIVIL,ZESTRIL) 20 MG tablet Take 1 tablet (20 mg total) by mouth daily. 90 tablet 3  . metoprolol succinate (TOPROL-XL) 50 MG 24 hr tablet TAKE 1 TABLET DAILY WITH OR IMMEDIATELY FOLLOWING A MEAL 90 tablet 3  . topiramate (TOPAMAX) 100 MG tablet Take 1 tablet (100 mg total) by mouth 2 (two) times daily. 180 tablet 3  . zolpidem (AMBIEN) 10 MG tablet Take 1 tablet (10 mg total) by mouth at bedtime as needed. 90 tablet 1  . fluticasone (FLONASE) 50 MCG/ACT nasal spray Place 2 sprays into both nostrils daily. 16 g 6  . SUMAtriptan (IMITREX) 100 MG tablet Take 1 tablet (100 mg total) by mouth once as needed for migraine. 30 tablet 3   No current facility-administered medications for this visit.   History   Social History  . Marital Status: Widowed    Spouse Name: N/A  . Number of Children: 2  . Years of Education: N/A   Occupational History  . Not on file.   Social History Main Topics  . Smoking status: Never Smoker   . Smokeless tobacco: Never Used  . Alcohol Use: No  . Drug Use: No  . Sexual Activity: Not on file   Other Topics Concern  . Not on file   Social History Narrative   Marital status: widowed since 2008 after 36 years of marriage; not dating      Children: 2; 1 grandchildren      Lives: alone      Employment:  Geologist, engineering kindergarten x 22 years; [employer]      Tobacco: none      Alcohol: none   One grandchild.   Data processing manager.      Exercise:  Walking once per week         Family History  Problem Relation Age of Onset  . Hypertension      mother, father, brother  . CAD Father 27  . Cancer Father      bladder cancer  . Diabetes Father   . Heart disease Father   . Hyperlipidemia Father   . Hypertension Father   . Valvular heart disease Mother     MVP  . Heart disease Mother   . Hyperlipidemia Mother   . Hypertension Mother   . Hypertension Brother        Objective:    BP 124/82 mmHg  Pulse 73  Temp(Src) 98.4 F (36.9 C) (Oral)  Resp 16  Ht  (1.575 m)  Wt 159 lb 6.4 oz (72.303 kg)  BMI 29.15 kg/m2  SpO2 100% Physical Exam  Constitutional: She is oriented to person, place, and time.  She appears well-developed and well-nourished. No distress.  HENT:  Head: Normocephalic and atraumatic.  Right Ear: External ear normal.  Left Ear: External ear normal.  Nose: Nose normal.  Mouth/Throat: Oropharynx is clear and moist. No oropharyngeal exudate.  Ulcerative lesion well healing under L tongue.   Eyes: Conjunctivae and EOM are normal. Pupils are equal, round, and reactive to light.  Neck: Normal range of motion. Neck supple. Carotid bruit is not present. No thyromegaly present.  Cardiovascular: Normal rate, regular rhythm, normal heart sounds and intact distal pulses.  Exam reveals no gallop and no friction rub.   No murmur heard. Pulmonary/Chest: Effort normal and breath sounds normal. She has no wheezes. She has no rales.  Abdominal: Soft. Bowel sounds are normal. She exhibits no distension and no mass. There is no tenderness. There is no rebound and no guarding.  Lymphadenopathy:    She has no cervical adenopathy.  Neurological: She is alert and oriented to person, place, and time. No cranial nerve deficit. She exhibits normal muscle tone. Coordination normal.  Skin: Skin is warm and dry. No rash noted. She is not diaphoretic. No erythema. No pallor.  Psychiatric: She has a normal mood and affect. Her behavior is normal. Judgment and thought content normal.   EKG: NSR; no ST changes.     Assessment & Plan:   1. Other chest pain   2. Essential hypertension, benign     3. Depression   4. Migraine without aura and without status migrainosus, not intractable   5. Screening for diabetes mellitus   6. INSOMNIA, CHRONIC   7. Irritable bowel syndrome   8. Pharyngitis   9. Allergic rhinitis due to pollen     1. Chest pain: chronic issue for patient; s/p cardiolite in past three years for symptoms that was negative; EKG WNL today; pt refused referral to cardiology or for repeat stress testing.  Advised patient to present to ED for acute worsening.  Exertional nature to chest pain very concerning to me and expressed concern to patient. 2.  HTN: controlled; obtain labs; refills provided. 3.  Depression: worsening due to family stressors; increase Lexapro to 1.5 tablets daily.   4.  Migraines: controlled; refill of Topamax and Imitrex provided. 5.  Screening diabetes; obtain glucose and HgbA1c. 6.  Insomnia chronic: stable; refill of Imipramine and Ambien provided. 7. IBS diarrhea: stable; intermittent issues.  Uses Questran PRN. 8.  Pharyngitis: New.  Secondary to allergic rhinitis: rx for Flonase provided; recommend OTC Claritin or Zyrtec. 9. Allergic Rhinitis: uncontrolled; rx for Flonase provided.   Meds ordered this encounter  Medications  . DISCONTD: fluticasone (FLONASE) 50 MCG/ACT nasal spray    Sig: Place 2 sprays into both nostrils daily.    Dispense:  16 g    Refill:  6  . DISCONTD: escitalopram (LEXAPRO) 10 MG tablet    Sig: Take 1.5 tablets (15 mg total) by mouth daily.    Dispense:  135 tablet    Refill:  3  . hydrochlorothiazide (HYDRODIURIL) 25 MG tablet    Sig: TAKE 1 TABLET BY MOUTH EVERY DAY    Dispense:  90 tablet    Refill:  3  . lisinopril (PRINIVIL,ZESTRIL) 20 MG tablet    Sig: Take 1 tablet (20 mg total) by mouth daily.    Dispense:  90 tablet    Refill:  3  . metoprolol succinate (TOPROL-XL) 50 MG 24 hr tablet    Sig: TAKE 1 TABLET DAILY WITH OR IMMEDIATELY FOLLOWING  A MEAL    Dispense:  90 tablet    Refill:  3  .  topiramate (TOPAMAX) 100 MG tablet    Sig: Take 1 tablet (100 mg total) by mouth 2 (two) times daily.    Dispense:  180 tablet    Refill:  3  . zolpidem (AMBIEN) 10 MG tablet    Sig: Take 1 tablet (10 mg total) by mouth at bedtime as needed.    Dispense:  90 tablet    Refill:  1  . DISCONTD: SUMAtriptan (IMITREX) 100 MG tablet    Sig: Take 1 tablet (100 mg total) by mouth once as needed for migraine.    Dispense:  30 tablet    Refill:  3  . imipramine (TOFRANIL) 50 MG tablet    Sig: Take 2 tablets (100 mg total) by mouth daily.    Dispense:  180 tablet    Refill:  1    Pt will need to set up with new PCP for further refills  . escitalopram (LEXAPRO) 10 MG tablet    Sig: Take 1.5 tablets (15 mg total) by mouth daily.    Dispense:  135 tablet    Refill:  3  . SUMAtriptan (IMITREX) 100 MG tablet    Sig: Take 1 tablet (100 mg total) by mouth once as needed for migraine.    Dispense:  30 tablet    Refill:  3  . fluticasone (FLONASE) 50 MCG/ACT nasal spray    Sig: Place 2 sprays into both nostrils daily.    Dispense:  16 g    Refill:  6    Return in about 6 months (around 08/20/2015) for recheck.     Eleshia Wooley Paulita Fujita, M.D. [facility] [address] [place], [place]  [address] [phone] phone [phone] fax

## 2015-02-18 NOTE — Patient Instructions (Signed)

## 2015-02-19 LAB — CBC WITH DIFFERENTIAL/PLATELET
BASOS ABS: 0 10*3/uL (ref 0.0–0.1)
BASOS PCT: 0 % (ref 0–1)
EOS ABS: 0 10*3/uL (ref 0.0–0.7)
EOS PCT: 0 % (ref 0–5)
HCT: 40 % (ref 36.0–46.0)
Hemoglobin: 13.5 g/dL (ref 12.0–15.0)
Lymphocytes Relative: 35 % (ref 12–46)
Lymphs Abs: 1.9 10*3/uL (ref 0.7–4.0)
MCH: 29.6 pg (ref 26.0–34.0)
MCHC: 33.8 g/dL (ref 30.0–36.0)
MCV: 87.7 fL (ref 78.0–100.0)
MPV: 9.8 fL (ref 8.6–12.4)
Monocytes Absolute: 0.5 10*3/uL (ref 0.1–1.0)
Monocytes Relative: 9 % (ref 3–12)
Neutro Abs: 3 10*3/uL (ref 1.7–7.7)
Neutrophils Relative %: 56 % (ref 43–77)
PLATELETS: 179 10*3/uL (ref 150–400)
RBC: 4.56 MIL/uL (ref 3.87–5.11)
RDW: 13.6 % (ref 11.5–15.5)
WBC: 5.4 10*3/uL (ref 4.0–10.5)

## 2015-02-19 LAB — COMPREHENSIVE METABOLIC PANEL
ALT: 9 U/L (ref 0–35)
AST: 16 U/L (ref 0–37)
Albumin: 3.8 g/dL (ref 3.5–5.2)
Alkaline Phosphatase: 80 U/L (ref 39–117)
BILIRUBIN TOTAL: 0.3 mg/dL (ref 0.2–1.2)
BUN: 15 mg/dL (ref 6–23)
CHLORIDE: 104 meq/L (ref 96–112)
CO2: 27 mEq/L (ref 19–32)
CREATININE: 0.84 mg/dL (ref 0.50–1.10)
Calcium: 8.7 mg/dL (ref 8.4–10.5)
Glucose, Bld: 80 mg/dL (ref 70–99)
Potassium: 3.2 mEq/L — ABNORMAL LOW (ref 3.5–5.3)
Sodium: 140 mEq/L (ref 135–145)
Total Protein: 6.6 g/dL (ref 6.0–8.3)

## 2015-02-19 LAB — LIPID PANEL
Cholesterol: 161 mg/dL (ref 0–200)
HDL: 42 mg/dL — ABNORMAL LOW (ref >=46)
LDL Cholesterol: 93 mg/dL (ref 0–99)
Total CHOL/HDL Ratio: 3.8 Ratio
Triglycerides: 132 mg/dL (ref <150)
VLDL: 26 mg/dL (ref 0–40)

## 2015-02-19 LAB — HEMOGLOBIN A1C
Hgb A1c MFr Bld: 5.4 % (ref <5.7)
MEAN PLASMA GLUCOSE: 108 mg/dL (ref <117)

## 2015-02-19 LAB — TSH: TSH: 0.597 u[IU]/mL (ref 0.350–4.500)

## 2015-02-23 MED ORDER — POTASSIUM CHLORIDE CRYS ER 20 MEQ PO TBCR: 20.0000 meq | EXTENDED_RELEASE_TABLET | Freq: Every day | ORAL | Status: DC

## 2015-02-23 NOTE — Addendum Note (Signed)
Addended by: Ethelda ChickSMITH, Teague Goynes M on: 02/23/2015 03:13 PM   Modules accepted: Orders

## 2015-04-22 ENCOUNTER — Encounter: Payer: Self-pay | Admitting: *Deleted

## 2015-09-10 ENCOUNTER — Other Ambulatory Visit: Payer: Self-pay

## 2015-09-10 MED ORDER — POTASSIUM CHLORIDE CRYS ER 20 MEQ PO TBCR: 20.0000 meq | EXTENDED_RELEASE_TABLET | Freq: Every day | ORAL | Status: DC

## 2015-09-10 NOTE — Telephone Encounter (Signed)
[facility] Script[facility] sent req for RF of imipramine 50 mg. Dr Katrinka BlazingSmith, when you saw pt in April, you filled other Rx for a year, but this one for only 6 mos w/comment that pt will need to f/up with her PCP for future RFs. I'm confused because your OV notes say pt is seeing you to est care and you are listed as her PCP. Do you want to OK RFs for the rest of the year, or pt RTC?

## 2015-09-11 MED ORDER — IMIPRAMINE HCL 50 MG PO TABS: 100.0000 mg | ORAL_TABLET | Freq: Every day | ORAL | Status: DC

## 2015-10-08 ENCOUNTER — Other Ambulatory Visit: Payer: Self-pay

## 2015-10-08 MED ORDER — IMIPRAMINE HCL 50 MG PO TABS: 100.0000 mg | ORAL_TABLET | Freq: Every day | ORAL | Status: DC

## 2015-10-24 ENCOUNTER — Ambulatory Visit (INDEPENDENT_AMBULATORY_CARE_PROVIDER_SITE_OTHER): Payer: BC Managed Care – PPO | Admitting: Family Medicine

## 2015-10-24 VITALS — BP 120/82 | HR 75 | Temp 98.0°F | Resp 18 | Ht 62.0 in | Wt 166.0 lb

## 2015-10-24 DIAGNOSIS — R05 Cough: Secondary | ICD-10-CM

## 2015-10-24 DIAGNOSIS — F329 Major depressive disorder, single episode, unspecified: Secondary | ICD-10-CM | POA: Diagnosis not present

## 2015-10-24 DIAGNOSIS — R059 Cough, unspecified: Secondary | ICD-10-CM

## 2015-10-24 DIAGNOSIS — K580 Irritable bowel syndrome with diarrhea: Secondary | ICD-10-CM | POA: Diagnosis not present

## 2015-10-24 DIAGNOSIS — R0789 Other chest pain: Secondary | ICD-10-CM | POA: Diagnosis not present

## 2015-10-24 DIAGNOSIS — J0100 Acute maxillary sinusitis, unspecified: Secondary | ICD-10-CM

## 2015-10-24 DIAGNOSIS — F32A Depression, unspecified: Secondary | ICD-10-CM

## 2015-10-24 MED ORDER — BENZONATATE 100 MG PO CAPS: 200.0000 mg | ORAL_CAPSULE | Freq: Two times a day (BID) | ORAL | Status: DC | PRN

## 2015-10-24 MED ORDER — AMOXICILLIN-POT CLAVULANATE 875-125 MG PO TABS: 1.0000 | ORAL_TABLET | Freq: Two times a day (BID) | ORAL | Status: DC

## 2015-10-24 MED ORDER — IMIPRAMINE HCL 50 MG PO TABS: 100.0000 mg | ORAL_TABLET | Freq: Every day | ORAL | Status: DC

## 2015-10-24 NOTE — Patient Instructions (Signed)

## 2015-10-24 NOTE — Progress Notes (Signed)
Chief Complaint:  Chief Complaint  Patient presents with  . Nasal Congestion    clear mucous x1 week   . Cough    green mucous   . Medication Refill    tofranil  . nausea, numbness in throat, chest pain    with some vomiting at times    HPI: Kelli Contreras is a 64 y.o. female who reports to [facility] today complaining of 1 week hx o fcough, runny nose is clear , no fever or chiulls. Sinus pressure and no ear pain. Green sputum, No CP or SOb or wheezing. She has tried Administrator plus buit not working and they have all been sick and mom is in nursing home, her and her roommate has had bronchitis. Chest pain when she has diarrhea nd she ahs numbnes sin thraot and get hot and after she throws up and has BM she feels.   Past Medical History  Diagnosis Date  . Hypertension   . Diarrhea   . Plantar fasciitis   . Depression   . Insomnia   . Cataract   . IBS (irritable bowel syndrome)     diarrhea  . Migraine    Past Surgical History  Procedure Laterality Date  . Vesicovaginal fistula closure w/ tah  1985    Dr. Ubaldo Glassing  . Bunionectomy  2007    Dr. Beola Cord  . Abdominal hysterectomy      DUB; ovaries intact.   Social History   Social History  . Marital Status: Widowed    Spouse Name: N/A  . Number of Children: 2  . Years of Education: N/A   Social History Main Topics  . Smoking status: Never Smoker   . Smokeless tobacco: Never Used  . Alcohol Use: No  . Drug Use: No  . Sexual Activity: Not Asked   Other Topics Concern  . None   Social History Narrative   Marital status: widowed since 2008 after 69 years of marriage; not dating      Children: 2; 1 grandchildren      Lives: alone      Employment:  Control and instrumentation engineer kindergarten x 22 years; Psychologist, sport and exercise      Tobacco: none      Alcohol: none   One grandchild.   Building control surveyor.      Exercise:  Walking once per week         Family History  Problem Relation Age of Onset  . Hypertension     mother, father, brother  . CAD Father 36  . Cancer Father     bladder cancer  . Diabetes Father   . Heart disease Father   . Hyperlipidemia Father   . Hypertension Father   . Valvular heart disease Mother     MVP  . Heart disease Mother   . Hyperlipidemia Mother   . Hypertension Mother   . Hypertension Brother    Allergies  Allergen Reactions  . Other     Onions & Chocolate cause migraine   Prior to Admission medications   Medication Sig Start Date End Date Taking? Authorizing Provider  aspirin 81 MG tablet Take 81 mg by mouth daily.   Yes Historical Provider, MD  cholestyramine Lucrezia Starch) 4 G packet Take 1 packet by mouth daily as needed. 04/01/11  Yes Noralee Space, MD  escitalopram (LEXAPRO) 10 MG tablet Take 1.5 tablets (15 mg total) by mouth daily. 02/18/15  Yes Wardell Honour, MD  hydrochlorothiazide (HYDRODIURIL) 25  MG tablet TAKE 1 TABLET BY MOUTH EVERY DAY 02/18/15  Yes Wardell Honour, MD  ibuprofen (ADVIL,MOTRIN) 200 MG tablet Take 200 mg by mouth every 6 (six) hours as needed.     Yes Historical Provider, MD  imipramine (TOFRANIL) 50 MG tablet Take 2 tablets (100 mg total) by mouth daily. NO MORE REFILLS WITHOUT OFFICE VISIT - 2ND NOTICE 10/08/15  Yes Wardell Honour, MD  lisinopril (PRINIVIL,ZESTRIL) 20 MG tablet Take 1 tablet (20 mg total) by mouth daily. 02/18/15  Yes Wardell Honour, MD  metoprolol succinate (TOPROL-XL) 50 MG 24 hr tablet TAKE 1 TABLET DAILY WITH OR IMMEDIATELY FOLLOWING A MEAL 02/18/15  Yes Wardell Honour, MD  potassium chloride SA (K-DUR,KLOR-CON) 20 MEQ tablet Take 1 tablet (20 mEq total) by mouth daily. 09/10/15  Yes Wardell Honour, MD  SUMAtriptan (IMITREX) 100 MG tablet Take 1 tablet (100 mg total) by mouth once as needed for migraine. 02/18/15 07/21/17 Yes Wardell Honour, MD  topiramate (TOPAMAX) 100 MG tablet Take 1 tablet (100 mg total) by mouth 2 (two) times daily. 02/18/15  Yes Wardell Honour, MD  zolpidem (AMBIEN) 10 MG tablet Take 1 tablet (10 mg  total) by mouth at bedtime as needed. 02/18/15  Yes Wardell Honour, MD  fluticasone (FLONASE) 50 MCG/ACT nasal spray Place 2 sprays into both nostrils daily. Patient not taking: Reported on 10/24/2015 02/18/15   Wardell Honour, MD     ROS: The patient denies fevers, chills, night sweats, unintentional weight loss, chest pain, palpitations, wheezing, dyspnea on exertion, nausea, vomiting, abdominal pain, dysuria, hematuria, melena, numbness, weakness, or tingling.   All other systems have been reviewed and were otherwise negative with the exception of those mentioned in the HPI and as above.    PHYSICAL EXAM: Filed Vitals:   10/24/15 1042  BP: 120/82  Pulse: 75  Temp: 98 F (36.7 C)  Resp: 18   Body mass index is 30.35 kg/(m^2).   General: Alert, no acute distress HEENT:  Normocephalic, atraumatic, oropharynx patent. EOMI, PERRLA Erythematous throat, no exudates, TM normal, + sinus tenderness, + erythematous/boggy nasal mucosa Cardiovascular:  Regular rate and rhythm, no rubs murmurs or gallops.  No Carotid bruits, radial pulse intact. No pedal edema.  Respiratory: Clear to auscultation bilaterally.  No wheezes, rales, or rhonchi.  No cyanosis, no use of accessory musculature Abdominal: No organomegaly, abdomen is soft and non-tender, positive bowel sounds. No masses. Skin: No rashes. Neurologic: Facial musculature symmetric. Psychiatric: Patient acts appropriately throughout our interaction. Lymphatic: No cervical or submandibular lymphadenopathy Musculoskeletal: Gait intact. No edema, tenderness   LABS: Results for orders placed or performed in visit on 02/18/15  CBC with Differential/Platelet  Result Value Ref Range   WBC 5.4 4.0 - 10.5 K/uL   RBC 4.56 3.87 - 5.11 MIL/uL   Hemoglobin 13.5 12.0 - 15.0 g/dL   HCT 40.0 36.0 - 46.0 %   MCV 87.7 78.0 - 100.0 fL   MCH 29.6 26.0 - 34.0 pg   MCHC 33.8 30.0 - 36.0 g/dL   RDW 13.6 11.5 - 15.5 %   Platelets 179 150 - 400 K/uL    MPV 9.8 8.6 - 12.4 fL   Neutrophils Relative % 56 43 - 77 %   Neutro Abs 3.0 1.7 - 7.7 K/uL   Lymphocytes Relative 35 12 - 46 %   Lymphs Abs 1.9 0.7 - 4.0 K/uL   Monocytes Relative 9 3 - 12 %   Monocytes Absolute  0.5 0.1 - 1.0 K/uL   Eosinophils Relative 0 0 - 5 %   Eosinophils Absolute 0.0 0.0 - 0.7 K/uL   Basophils Relative 0 0 - 1 %   Basophils Absolute 0.0 0.0 - 0.1 K/uL   Smear Review Criteria for review not met   Comprehensive metabolic panel  Result Value Ref Range   Sodium 140 135 - 145 mEq/L   Potassium 3.2 (L) 3.5 - 5.3 mEq/L   Chloride 104 96 - 112 mEq/L   CO2 27 19 - 32 mEq/L   Glucose, Bld 80 70 - 99 mg/dL   BUN 15 6 - 23 mg/dL   Creat 0.84 0.50 - 1.10 mg/dL   Total Bilirubin 0.3 0.2 - 1.2 mg/dL   Alkaline Phosphatase 80 39 - 117 U/L   AST 16 0 - 37 U/L   ALT 9 0 - 35 U/L   Total Protein 6.6 6.0 - 8.3 g/dL   Albumin 3.8 3.5 - 5.2 g/dL   Calcium 8.7 8.4 - 10.5 mg/dL  Hemoglobin A1c  Result Value Ref Range   Hgb A1c MFr Bld 5.4 <5.7 %   Mean Plasma Glucose 108 <117 mg/dL  TSH  Result Value Ref Range   TSH 0.597 0.350 - 4.500 uIU/mL  Lipid panel  Result Value Ref Range   Cholesterol 161 0 - 200 mg/dL   Triglycerides 132 <150 mg/dL   HDL 42 (L) >=46 mg/dL   Total CHOL/HDL Ratio 3.8 Ratio   VLDL 26 0 - 40 mg/dL   LDL Cholesterol 93 0 - 99 mg/dL  POCT urinalysis dipstick  Result Value Ref Range   Color, UA yellow    Clarity, UA cloudy    Glucose, UA neg    Bilirubin, UA neg    Ketones, UA neg    Spec Grav, UA 1.015    Blood, UA small    pH, UA 6.0    Protein, UA neg    Urobilinogen, UA 1.0    Nitrite, UA neg    Leukocytes, UA large (3+)   POCT UA - Microscopic Only  Result Value Ref Range   WBC, Ur, HPF, POC 6-20    RBC, urine, microscopic 0-3    Bacteria, U Microscopic 2+    Mucus, UA neg    Epithelial cells, urine per micros 2-6    Crystals, Ur, HPF, POC neg    Casts, Ur, LPF, POC neg    Yeast, UA neg      EKG/XRAY:   Primary read  interpreted by Dr. Marin Comment at [facility].   ASSESSMENT/PLAN: Encounter Diagnoses  Name Primary?  . Acute maxillary sinusitis, recurrence not specified Yes  . Depression   . Other chest pain   . Irritable bowel syndrome with diarrhea   . Cough    Rx augmentin abbd tessalon perles Refill Tofanil, decline any SI HI or hallucinations Fu prn   Gross sideeffects, risk and benefits, and alternatives of medications d/w patient. Patient is aware that all medications have potential sideeffects and we are unable to predict every sideeffect or drug-drug interaction that may occur.  Thao Le DO  10/24/2015 11:32 AM

## 2015-12-26 ENCOUNTER — Other Ambulatory Visit: Payer: Self-pay

## 2015-12-26 MED ORDER — POTASSIUM CHLORIDE CRYS ER 20 MEQ PO TBCR: 20.0000 meq | EXTENDED_RELEASE_TABLET | Freq: Every day | ORAL | Status: DC

## 2015-12-26 MED ORDER — TOPIRAMATE 100 MG PO TABS: 100.0000 mg | ORAL_TABLET | Freq: Two times a day (BID) | ORAL | Status: DC

## 2015-12-26 MED ORDER — HYDROCHLOROTHIAZIDE 25 MG PO TABS: ORAL_TABLET | ORAL | Status: DC

## 2015-12-30 ENCOUNTER — Emergency Department (HOSPITAL_COMMUNITY): Payer: BC Managed Care – PPO

## 2015-12-30 ENCOUNTER — Encounter (HOSPITAL_COMMUNITY): Payer: Self-pay

## 2015-12-30 ENCOUNTER — Emergency Department (HOSPITAL_COMMUNITY)
Admission: EM | Admit: 2015-12-30 | Discharge: 2015-12-31 | Disposition: A | Discharge status: Home / Self Care | Payer: BC Managed Care – PPO | Attending: Emergency Medicine | Admitting: Emergency Medicine

## 2015-12-30 DIAGNOSIS — Z8739 Personal history of other diseases of the musculoskeletal system and connective tissue: Secondary | ICD-10-CM | POA: Insufficient documentation

## 2015-12-30 DIAGNOSIS — Z7982 Long term (current) use of aspirin: Secondary | ICD-10-CM | POA: Insufficient documentation

## 2015-12-30 DIAGNOSIS — R110 Nausea: Secondary | ICD-10-CM | POA: Insufficient documentation

## 2015-12-30 DIAGNOSIS — Z8719 Personal history of other diseases of the digestive system: Secondary | ICD-10-CM | POA: Diagnosis not present

## 2015-12-30 DIAGNOSIS — R059 Cough, unspecified: Secondary | ICD-10-CM

## 2015-12-30 DIAGNOSIS — N390 Urinary tract infection, site not specified: Secondary | ICD-10-CM | POA: Diagnosis not present

## 2015-12-30 DIAGNOSIS — G43909 Migraine, unspecified, not intractable, without status migrainosus: Secondary | ICD-10-CM | POA: Diagnosis not present

## 2015-12-30 DIAGNOSIS — Z79899 Other long term (current) drug therapy: Secondary | ICD-10-CM | POA: Diagnosis not present

## 2015-12-30 DIAGNOSIS — I10 Essential (primary) hypertension: Secondary | ICD-10-CM | POA: Insufficient documentation

## 2015-12-30 DIAGNOSIS — F329 Major depressive disorder, single episode, unspecified: Secondary | ICD-10-CM | POA: Insufficient documentation

## 2015-12-30 DIAGNOSIS — R05 Cough: Secondary | ICD-10-CM | POA: Diagnosis present

## 2015-12-30 DIAGNOSIS — J069 Acute upper respiratory infection, unspecified: Secondary | ICD-10-CM | POA: Diagnosis not present

## 2015-12-30 DIAGNOSIS — Z792 Long term (current) use of antibiotics: Secondary | ICD-10-CM | POA: Insufficient documentation

## 2015-12-30 DIAGNOSIS — G4700 Insomnia, unspecified: Secondary | ICD-10-CM | POA: Insufficient documentation

## 2015-12-30 LAB — CBC WITH DIFFERENTIAL/PLATELET
BASOS ABS: 0 10*3/uL (ref 0.0–0.1)
BASOS PCT: 0 %
EOS PCT: 0 %
Eosinophils Absolute: 0 10*3/uL (ref 0.0–0.7)
HCT: 42.6 % (ref 36.0–46.0)
Hemoglobin: 13.9 g/dL (ref 12.0–15.0)
Lymphocytes Relative: 16 %
Lymphs Abs: 1.1 10*3/uL (ref 0.7–4.0)
MCH: 29.2 pg (ref 26.0–34.0)
MCHC: 32.6 g/dL (ref 30.0–36.0)
MCV: 89.5 fL (ref 78.0–100.0)
MONO ABS: 0.7 10*3/uL (ref 0.1–1.0)
Monocytes Relative: 10 %
Neutro Abs: 4.8 10*3/uL (ref 1.7–7.7)
Neutrophils Relative %: 74 %
PLATELETS: 145 10*3/uL — AB (ref 150–400)
RBC: 4.76 MIL/uL (ref 3.87–5.11)
RDW: 13.1 % (ref 11.5–15.5)
WBC: 6.5 10*3/uL (ref 4.0–10.5)

## 2015-12-30 LAB — URINALYSIS, ROUTINE W REFLEX MICROSCOPIC
GLUCOSE, UA: NEGATIVE mg/dL
KETONES UR: NEGATIVE mg/dL
NITRITE: NEGATIVE
PROTEIN: 30 mg/dL — AB
Specific Gravity, Urine: 1.029 (ref 1.005–1.030)
pH: 5.5 (ref 5.0–8.0)

## 2015-12-30 LAB — URINE MICROSCOPIC-ADD ON

## 2015-12-30 LAB — COMPREHENSIVE METABOLIC PANEL
ALBUMIN: 3.7 g/dL (ref 3.5–5.0)
ALK PHOS: 71 U/L (ref 38–126)
ALT: 17 U/L (ref 14–54)
AST: 26 U/L (ref 15–41)
Anion gap: 12 (ref 5–15)
BILIRUBIN TOTAL: 0.2 mg/dL — AB (ref 0.3–1.2)
BUN: 13 mg/dL (ref 6–20)
CALCIUM: 8.7 mg/dL — AB (ref 8.9–10.3)
CO2: 25 mmol/L (ref 22–32)
CREATININE: 1.06 mg/dL — AB (ref 0.44–1.00)
Chloride: 99 mmol/L — ABNORMAL LOW (ref 101–111)
GFR calc Af Amer: >60 mL/min (ref >60)
GFR calc non Af Amer: 55 mL/min — ABNORMAL LOW (ref >60)
GLUCOSE: 103 mg/dL — AB (ref 65–99)
Potassium: 3.4 mmol/L — ABNORMAL LOW (ref 3.5–5.1)
Sodium: 136 mmol/L (ref 135–145)
TOTAL PROTEIN: 6.7 g/dL (ref 6.5–8.1)

## 2015-12-30 LAB — I-STAT CG4 LACTIC ACID, ED
LACTIC ACID, VENOUS: 0.95 mmol/L (ref 0.5–2.0)
Lactic Acid, Venous: 1.34 mmol/L (ref 0.5–2.0)

## 2015-12-30 LAB — LIPASE, BLOOD: Lipase: 30 U/L (ref 11–51)

## 2015-12-30 MED ORDER — POTASSIUM CHLORIDE CRYS ER 20 MEQ PO TBCR
40.0000 meq | EXTENDED_RELEASE_TABLET | Freq: Once | ORAL | Status: AC
  Administered 2015-12-30: 40 meq via ORAL
  Filled 2015-12-30: qty 2

## 2015-12-30 MED ORDER — SODIUM CHLORIDE 0.9 % IV SOLN
1000.0000 mL | Freq: Once | INTRAVENOUS | Status: AC
  Administered 2015-12-30: 1000 mL via INTRAVENOUS

## 2015-12-30 MED ORDER — DEXAMETHASONE SODIUM PHOSPHATE 10 MG/ML IJ SOLN
10.0000 mg | Freq: Once | INTRAMUSCULAR | Status: AC
  Administered 2015-12-30: 10 mg via INTRAVENOUS
  Filled 2015-12-30: qty 1

## 2015-12-30 MED ORDER — KETOROLAC TROMETHAMINE 30 MG/ML IJ SOLN
30.0000 mg | Freq: Once | INTRAMUSCULAR | Status: AC
  Administered 2015-12-30: 30 mg via INTRAVENOUS
  Filled 2015-12-30: qty 1

## 2015-12-30 MED ORDER — ONDANSETRON HCL 4 MG/2ML IJ SOLN
4.0000 mg | Freq: Once | INTRAMUSCULAR | Status: AC
  Administered 2015-12-30: 4 mg via INTRAVENOUS
  Filled 2015-12-30: qty 2

## 2015-12-30 MED ORDER — SODIUM CHLORIDE 0.9 % IV SOLN: 1000.0000 mL | INTRAVENOUS | Status: DC

## 2015-12-30 NOTE — ED Notes (Signed)
Patient here with ongoing cough, congestion, bodyaches x 1 week. On Saturday had diarrhea that has resolved. Patient complains of increased weakness

## 2015-12-30 NOTE — ED Provider Notes (Signed)
CSN: [id number]     Arrival date & time 12/30/15  1659 History  By signing my name below, I, Marisue Humble, attest that this documentation has been prepared under the direction and in the presence of Dione Booze, MD . Electronically Signed: Marisue Humble, Scribe. 12/30/2015. 11:19 PM.   Chief Complaint  Patient presents with  . Cough  . Nasal Congestion   The history is provided by the patient and a relative. No language interpreter was used.   HPI Comments:  Kelli Contreras is a 65 y.o. female with PMHx of HTN who presents to the Emergency Department complaining of persistent, 10/10 body aches for the past week. Pt reports tmax 102 today but is unsure of fever for the past week. Pt reports associated chills, fatigue, nausea onset today, congestion and non-productive cough. Pt notes taking Ibuprofen with no relief. Relative reports pt had cataracts surgery one week ago and has been weak since. She also reports an episode of diarrhea and syncope five days ago; diarrhea is currently alleviated. Pt was evaluated at urgent care earlier today; she states the doctor diagnosed her with flu and dehydration but referred her to the ED for further evaluation. Pt reports sick contacts at work. Pt denies flu shot this season, vomiting, sore throat, and any other symptoms at this time.  Past Medical History  Diagnosis Date  . Hypertension   . Diarrhea   . Plantar fasciitis   . Depression   . Insomnia   . Cataract   . IBS (irritable bowel syndrome)     diarrhea  . Migraine    Past Surgical History  Procedure Laterality Date  . Vesicovaginal fistula closure w/ tah  1985    Dr. Nicholas Lose  . Bunionectomy  2007    Dr. Lestine Box  . Abdominal hysterectomy      DUB; ovaries intact.   Family History  Problem Relation Age of Onset  . Hypertension      mother, father, brother  . CAD Father 20  . Cancer Father     bladder cancer  . Diabetes Father   . Heart disease Father   . Hyperlipidemia Father    . Hypertension Father   . Valvular heart disease Mother     MVP  . Heart disease Mother   . Hyperlipidemia Mother   . Hypertension Mother   . Hypertension Brother    Social History  Substance Use Topics  . Smoking status: Never Smoker   . Smokeless tobacco: Never Used  . Alcohol Use: No   OB History    No data available     Review of Systems  Constitutional: Positive for fever, chills and fatigue.  HENT: Positive for congestion. Negative for sore throat.   Respiratory: Positive for cough.   Gastrointestinal: Positive for nausea and diarrhea (five days ago). Negative for vomiting.  Neurological: Positive for syncope (five days ago).  All other systems reviewed and are negative.  Allergies  Other  Home Medications   Prior to Admission medications   Medication Sig Start Date End Date Taking? Authorizing Provider  aspirin 81 MG tablet Take 81 mg by mouth at bedtime.    Yes Historical Provider, MD  BESIVANCE 0.6 % SUSP Place 1 drop into the left eye 3 (three) times daily. 11/07/15  Yes Historical Provider, MD  escitalopram (LEXAPRO) 10 MG tablet Take 1.5 tablets (15 mg total) by mouth daily. Patient taking differently: Take 15 mg by mouth at bedtime.  02/18/15  Yes Silva Bandy  Dwan Bolt, MD  hydrochlorothiazide (HYDRODIURIL) 25 MG tablet TAKE 1 TABLET BY MOUTH EVERY DAY Patient taking differently: Take 25 mg by mouth at bedtime. TAKE 1 TABLET BY MOUTH EVERY DAY 12/26/15  Yes Ethelda Chick, MD  ibuprofen (ADVIL,MOTRIN) 200 MG tablet Take 400 mg by mouth every 6 (six) hours as needed for moderate pain.    Yes Historical Provider, MD  imipramine (TOFRANIL) 50 MG tablet Take 2 tablets (100 mg total) by mouth daily. Patient taking differently: Take 100 mg by mouth at bedtime.  10/24/15  Yes Thao P Le, DO  lisinopril (PRINIVIL,ZESTRIL) 20 MG tablet Take 1 tablet (20 mg total) by mouth daily. Patient taking differently: Take 20 mg by mouth at bedtime.  02/18/15  Yes Ethelda Chick, MD   metoprolol succinate (TOPROL-XL) 50 MG 24 hr tablet TAKE 1 TABLET DAILY WITH OR IMMEDIATELY FOLLOWING A MEAL Patient taking differently: Take 50 mg by mouth at bedtime. TAKE 1 TABLET DAILY WITH OR IMMEDIATELY FOLLOWING A MEAL 02/18/15  Yes Ethelda Chick, MD  potassium chloride SA (K-DUR,KLOR-CON) 20 MEQ tablet Take 1 tablet (20 mEq total) by mouth daily. Patient taking differently: Take 20 mEq by mouth at bedtime.  12/26/15  Yes Ethelda Chick, MD  SUMAtriptan (IMITREX) 100 MG tablet Take 1 tablet (100 mg total) by mouth once as needed for migraine. 02/18/15 07/21/17 Yes Ethelda Chick, MD  topiramate (TOPAMAX) 100 MG tablet Take 1 tablet (100 mg total) by mouth 2 (two) times daily. Patient taking differently: Take 200 mg by mouth at bedtime.  12/26/15  Yes Ethelda Chick, MD  zolpidem (AMBIEN) 10 MG tablet Take 1 tablet (10 mg total) by mouth at bedtime as needed. Patient taking differently: Take 10 mg by mouth at bedtime as needed for sleep.  02/18/15  Yes Ethelda Chick, MD  amoxicillin-clavulanate (AUGMENTIN) 875-125 MG tablet Take 1 tablet by mouth 2 (two) times daily. 10/24/15   Thao P Le, DO  benzonatate (TESSALON) 100 MG capsule Take 2 capsules (200 mg total) by mouth 2 (two) times daily as needed. 10/24/15   Thao P Le, DO  cholestyramine (QUESTRAN) 4 G packet Take 1 packet by mouth daily as needed. 04/01/11   Michele Mcalpine, MD  fluticasone (FLONASE) 50 MCG/ACT nasal spray Place 2 sprays into both nostrils daily. Patient not taking: Reported on 10/24/2015 02/18/15   Ethelda Chick, MD   BP 120/75 mmHg  Pulse 108  Temp(Src) 99.2 F (37.3 C) (Oral)  Resp 16  SpO2 97% Physical Exam  Constitutional: She is oriented to person, place, and time. She appears well-developed and well-nourished. No distress.  HENT:  Head: Normocephalic and atraumatic.  Right Ear: Hearing normal.  Left Ear: Hearing normal.  Nose: Nose normal.  Mouth/Throat: Oropharynx is clear and moist and mucous membranes are  normal.  Eyes: Conjunctivae and EOM are normal. Pupils are equal, round, and reactive to light.  Neck: Normal range of motion. Neck supple. No JVD present.  Cardiovascular: Regular rhythm, S1 normal and S2 normal.  Exam reveals no gallop and no friction rub.   No murmur heard. Pulmonary/Chest: Effort normal and breath sounds normal. She has no wheezes. She has no rales. She exhibits no tenderness.  Abdominal: Soft. Normal appearance and bowel sounds are normal. She exhibits no distension and no mass. There is no hepatosplenomegaly. There is no tenderness. There is no tenderness at McBurney's point and negative Murphy's sign. No hernia.  Bowel sounds decreased  Musculoskeletal:  Normal range of motion. She exhibits no edema.  Lymphadenopathy:    She has no cervical adenopathy.  Neurological: She is alert and oriented to person, place, and time. No cranial nerve deficit or sensory deficit. She exhibits normal muscle tone. Coordination normal. GCS eye subscore is 4. GCS verbal subscore is 5. GCS motor subscore is 6.  Skin: Skin is warm, dry and intact. No rash noted. No cyanosis.  Psychiatric: She has a normal mood and affect. Her speech is normal and behavior is normal. Judgment and thought content normal.  Nursing note and vitals reviewed.  ED Course  Procedures  DIAGNOSTIC STUDIES:  Oxygen Saturation is 100% on RA, normal by my interpretation.    COORDINATION OF CARE:  11:13 PM Will administer steroid, fluids, and nausea medication. Discussed treatment plan with pt at bedside and pt agreed to plan.  Labs Review Results for orders placed or performed during the hospital encounter of 12/30/15  Comprehensive metabolic panel  Result Value Ref Range   Sodium 136 135 - 145 mmol/L   Potassium 3.4 (L) 3.5 - 5.1 mmol/L   Chloride 99 (L) 101 - 111 mmol/L   CO2 25 22 - 32 mmol/L   Glucose, Bld 103 (H) 65 - 99 mg/dL   BUN 13 6 - 20 mg/dL   Creatinine, Ser 1.61 (H) 0.44 - 1.00 mg/dL   Calcium  8.7 (L) 8.9 - 10.3 mg/dL   Total Protein 6.7 6.5 - 8.1 g/dL   Albumin 3.7 3.5 - 5.0 g/dL   AST 26 15 - 41 U/L   ALT 17 14 - 54 U/L   Alkaline Phosphatase 71 38 - 126 U/L   Total Bilirubin 0.2 (L) 0.3 - 1.2 mg/dL   GFR calc non Af Amer 55 (L) >60 mL/min   GFR calc Af Amer >60 >60 mL/min   Anion gap 12 5 - 15  CBC with Differential  Result Value Ref Range   WBC 6.5 4.0 - 10.5 K/uL   RBC 4.76 3.87 - 5.11 MIL/uL   Hemoglobin 13.9 12.0 - 15.0 g/dL   HCT 09.6 04.5 - 40.9 %   MCV 89.5 78.0 - 100.0 fL   MCH 29.2 26.0 - 34.0 pg   MCHC 32.6 30.0 - 36.0 g/dL   RDW 81.1 91.4 - 78.2 %   Platelets 145 (L) 150 - 400 K/uL   Neutrophils Relative % 74 %   Neutro Abs 4.8 1.7 - 7.7 K/uL   Lymphocytes Relative 16 %   Lymphs Abs 1.1 0.7 - 4.0 K/uL   Monocytes Relative 10 %   Monocytes Absolute 0.7 0.1 - 1.0 K/uL   Eosinophils Relative 0 %   Eosinophils Absolute 0.0 0.0 - 0.7 K/uL   Basophils Relative 0 %   Basophils Absolute 0.0 0.0 - 0.1 K/uL  Urinalysis, Routine w reflex microscopic (not at [facility])  Result Value Ref Range   Color, Urine AMBER (A) YELLOW   APPearance TURBID (A) CLEAR   Specific Gravity, Urine 1.029 1.005 - 1.030   pH 5.5 5.0 - 8.0   Glucose, UA NEGATIVE NEGATIVE mg/dL   Hgb urine dipstick SMALL (A) NEGATIVE   Bilirubin Urine SMALL (A) NEGATIVE   Ketones, ur NEGATIVE NEGATIVE mg/dL   Protein, ur 30 (A) NEGATIVE mg/dL   Nitrite NEGATIVE NEGATIVE   Leukocytes, UA LARGE (A) NEGATIVE  Lipase, blood  Result Value Ref Range   Lipase 30 11 - 51 U/L  Urine microscopic-add on  Result Value Ref Range  Squamous Epithelial / LPF 0-5 (A) NONE SEEN   WBC, UA TOO NUMEROUS TO COUNT 0 - 5 WBC/hpf   RBC / HPF 0-5 0 - 5 RBC/hpf   Bacteria, UA MANY (A) NONE SEEN  I-Stat CG4 Lactic Acid, ED (Not at [facility])  Result Value Ref Range   Lactic Acid, Venous 1.34 0.5 - 2.0 mmol/L  I-Stat CG4 Lactic Acid, ED (Not at [facility])  Result Value Ref Range   Lactic Acid, Venous 0.95 0.5 - 2.0 mmol/L     Imaging Review Dg Chest 2 View  12/30/2015  CLINICAL DATA:  65 year old presenting with 1 week history of cough, chest congestion and body aches. EXAM: CHEST  2 VIEW COMPARISON:  01/30/2014, 08/30/2012. FINDINGS: Cardiac silhouette normal in size, unchanged. Thoracic aorta mildly atherosclerotic and tortuous, unchanged. Hilar and mediastinal contours otherwise unremarkable. Mild linear atelectasis in the left lower lobe. Lungs otherwise clear. Bronchovascular markings normal. No localized airspace consolidation. No pleural effusions. No pneumothorax. Normal pulmonary vascularity. Mild degenerative changes involving the thoracic spine with exaggeration of the usual thoracic kyphosis. IMPRESSION: Mild linear atelectasis in the left lower lobe. No acute cardiopulmonary disease otherwise. Electronically Signed   By: Hulan Saas M.D.   On: 12/30/2015 18:09   I have personally reviewed and evaluated these images and lab results as part of my medical decision-making.   MDM   Final diagnoses:  Cough  Upper respiratory infection  Urinary tract infection without hematuria, site unspecified    Respiratory tract infection with cough. Cough in the ED appears dry. Chest x-ray shows no evidence of pneumonia. Screening labs were significant for incidental finding of urinary tract infection. Old records are reviewed and she has no relevant past visits and the [facility] system. She is discharged with prescriptions for nitrofurantoin and benzonatate. Also, given a prescription for small number of hydrocodone-acetaminophen to use to suppress cough at night so she can sleep. Follow-up with PCP.  I personally performed the services described in this documentation, which was scribed in my presence. The recorded information has been reviewed and is accurate.      Dione Booze, MD 12/31/15 [phone]

## 2015-12-30 NOTE — ED Notes (Signed)
Phlebotomy at bedside.

## 2015-12-31 MED ORDER — NITROFURANTOIN MONOHYD MACRO 100 MG PO CAPS
100.0000 mg | ORAL_CAPSULE | Freq: Once | ORAL | Status: AC
  Administered 2015-12-31: 100 mg via ORAL
  Filled 2015-12-31: qty 1

## 2015-12-31 MED ORDER — HYDROCODONE-ACETAMINOPHEN 5-325 MG PO TABS
1.0000 | ORAL_TABLET | Freq: Once | ORAL | Status: AC
  Administered 2015-12-31: 1 via ORAL
  Filled 2015-12-31: qty 1

## 2015-12-31 MED ORDER — BENZONATATE 100 MG PO CAPS
200.0000 mg | ORAL_CAPSULE | Freq: Once | ORAL | Status: AC
  Administered 2015-12-31: 200 mg via ORAL
  Filled 2015-12-31: qty 2

## 2015-12-31 MED ORDER — NITROFURANTOIN MONOHYD MACRO 100 MG PO CAPS: 100.0000 mg | ORAL_CAPSULE | Freq: Two times a day (BID) | ORAL | Status: DC

## 2015-12-31 MED ORDER — BENZONATATE 100 MG PO CAPS: 100.0000 mg | ORAL_CAPSULE | Freq: Three times a day (TID) | ORAL | Status: DC

## 2015-12-31 MED ORDER — HYDROCODONE-ACETAMINOPHEN 5-325 MG PO TABS
1.0000 | ORAL_TABLET | ORAL | Status: DC | PRN
Start: 2015-12-31 — End: 2017-02-11

## 2015-12-31 NOTE — Discharge Instructions (Signed)
Cough, Adult °Coughing is a reflex that clears your throat and your airways. Coughing helps to heal and protect your lungs. It is normal to cough occasionally, but a cough that happens with other symptoms or lasts a long time may be a sign of a condition that needs treatment. A cough may last only 2-3 weeks (acute), or it may last longer than 8 weeks (chronic). °CAUSES °Coughing is commonly caused by: °· Breathing in substances that irritate your lungs. °· A viral or bacterial respiratory infection. °· Allergies. °· Asthma. °· Postnasal drip. °· Smoking. °· Acid backing up from the stomach into the esophagus (gastroesophageal reflux). °· Certain medicines. °· Chronic lung problems, including COPD (or rarely, lung cancer). °· Other medical conditions such as heart failure. °HOME CARE INSTRUCTIONS  °Pay attention to any changes in your symptoms. Take these actions to help with your discomfort: °· Take medicines only as told by your health care provider. °· If you were prescribed an antibiotic medicine, take it as told by your health care provider. Do not stop taking the antibiotic even if you start to feel better. °· Talk with your health care provider before you take a cough suppressant medicine. °· Drink enough fluid to keep your urine clear or pale yellow. °· If the air is dry, use a cold steam vaporizer or humidifier in your bedroom or your home to help loosen secretions. °· Avoid anything that causes you to cough at work or at home. °· If your cough is worse at night, try sleeping in a semi-upright position. °· Avoid cigarette smoke. If you smoke, quit smoking. If you need help quitting, ask your health care provider. °· Avoid caffeine. °· Avoid alcohol. °· Rest as needed. °SEEK MEDICAL CARE IF:  °· You have new symptoms. °· You cough up pus. °· Your cough does not get better after 2-3 weeks, or your cough gets worse. °· You cannot control your cough with suppressant medicines and you are losing sleep. °· You  develop pain that is getting worse or pain that is not controlled with pain medicines. °· You have a fever. °· You have unexplained weight loss. °· You have night sweats. °SEEK IMMEDIATE MEDICAL CARE IF: °· You cough up blood. °· You have difficulty breathing. °· Your heartbeat is very fast. °  °This information is not intended to replace advice given to you by your health care provider. Make sure you discuss any questions you have with your health care provider. °  °Document Released: 04/24/2011 Document Revised: 07/17/2015 Document Reviewed: 01/02/2015 °Elsevier Interactive Patient Education ©2016 [employer]. ° °Upper Respiratory Infection, Adult °Most upper respiratory infections (URIs) are a viral infection of the air passages leading to the lungs. A URI affects the nose, throat, and upper air passages. The most common type of URI is nasopharyngitis and is typically referred to as "the common cold." °URIs run their course and usually go away on their own. Most of the time, a URI does not require medical attention, but sometimes a bacterial infection in the upper airways can follow a viral infection. This is called a secondary infection. Sinus and middle ear infections are common types of secondary upper respiratory infections. °Bacterial pneumonia can also complicate a URI. A URI can worsen asthma and chronic obstructive pulmonary disease (COPD). Sometimes, these complications can require emergency medical care and may be life threatening.  °CAUSES °Almost all URIs are caused by viruses. A virus is a type of germ and can spread from one   person to another.  RISKS FACTORS You may be at risk for a URI if:   You smoke.   You have chronic heart or lung disease.  You have a weakened defense (immune) system.   You are very young or very old.   You have nasal allergies or asthma.  You work in crowded or poorly ventilated areas.  You work in health care facilities or schools. SIGNS AND SYMPTOMS    Symptoms typically develop 2-3 days after you come in contact with a cold virus. Most viral URIs last 7-10 days. However, viral URIs from the influenza virus (flu virus) can last 14-18 days and are typically more severe. Symptoms may include:   Runny or stuffy (congested) nose.   Sneezing.   Cough.   Sore throat.   Headache.   Fatigue.   Fever.   Loss of appetite.   Pain in your forehead, behind your eyes, and over your cheekbones (sinus pain).  Muscle aches.  DIAGNOSIS  Your health care provider may diagnose a URI by:  Physical exam.  Tests to check that your symptoms are not due to another condition such as:  Strep throat.  Sinusitis.  Pneumonia.  Asthma. TREATMENT  A URI goes away on its own with time. It cannot be cured with medicines, but medicines may be prescribed or recommended to relieve symptoms. Medicines may help:  Reduce your fever.  Reduce your cough.  Relieve nasal congestion. HOME CARE INSTRUCTIONS   Take medicines only as directed by your health care provider.   Gargle warm saltwater or take cough drops to comfort your throat as directed by your health care provider.  Use a warm mist humidifier or inhale steam from a shower to increase air moisture. This may make it easier to breathe.  Drink enough fluid to keep your urine clear or pale yellow.   Eat soups and other clear broths and maintain good nutrition.   Rest as needed.   Return to work when your temperature has returned to normal or as your health care provider advises. You may need to stay home longer to avoid infecting others. You can also use a face mask and careful hand washing to prevent spread of the virus.  Increase the usage of your inhaler if you have asthma.   Do not use any tobacco products, including cigarettes, chewing tobacco, or electronic cigarettes. If you need help quitting, ask your health care provider. PREVENTION  The best way to protect  yourself from getting a cold is to practice good hygiene.   Avoid oral or hand contact with people with cold symptoms.   Wash your hands often if contact occurs.  There is no clear evidence that vitamin C, vitamin E, echinacea, or exercise reduces the chance of developing a cold. However, it is always recommended to get plenty of rest, exercise, and practice good nutrition.  SEEK MEDICAL CARE IF:   You are getting worse rather than better.   Your symptoms are not controlled by medicine.   You have chills.  You have worsening shortness of breath.  You have brown or red mucus.  You have yellow or brown nasal discharge.  You have pain in your face, especially when you bend forward.  You have a fever.  You have swollen neck glands.  You have pain while swallowing.  You have white areas in the back of your throat. SEEK IMMEDIATE MEDICAL CARE IF:   You have severe or persistent:  Headache.  Ear pain.  Sinus pain.  Chest pain.  You have chronic lung disease and any of the following:  Wheezing.  Prolonged cough.  Coughing up blood.  A change in your usual mucus.  You have a stiff neck.  You have changes in your:  Vision.  Hearing.  Thinking.  Mood. MAKE SURE YOU:   Understand these instructions.  Will watch your condition.  Will get help right away if you are not doing well or get worse.   This information is not intended to replace advice given to you by your health care provider. Make sure you discuss any questions you have with your health care provider.   Document Released: 04/21/2001 Document Revised: 03/12/2015 Document Reviewed: 01/31/2014 Elsevier Interactive Patient Education 2016 [employer]  Urinary Tract Infection Urinary tract infections (UTIs) can develop anywhere along your urinary tract. Your urinary tract is your body's drainage system for removing wastes and extra water. Your urinary tract includes two kidneys, two ureters,  a bladder, and a urethra. Your kidneys are a pair of bean-shaped organs. Each kidney is about the size of your fist. They are located below your ribs, one on each side of your spine. CAUSES Infections are caused by microbes, which are microscopic organisms, including fungi, viruses, and bacteria. These organisms are so small that they can only be seen through a microscope. Bacteria are the microbes that most commonly cause UTIs. SYMPTOMS  Symptoms of UTIs may vary by age and gender of the patient and by the location of the infection. Symptoms in young women typically include a frequent and intense urge to urinate and a painful, burning feeling in the bladder or urethra during urination. Older women and men are more likely to be tired, shaky, and weak and have muscle aches and abdominal pain. A fever may mean the infection is in your kidneys. Other symptoms of a kidney infection include pain in your back or sides below the ribs, nausea, and vomiting. DIAGNOSIS To diagnose a UTI, your caregiver will ask you about your symptoms. Your caregiver will also ask you to provide a urine sample. The urine sample will be tested for bacteria and white blood cells. White blood cells are made by your body to help fight infection. TREATMENT  Typically, UTIs can be treated with medication. Because most UTIs are caused by a bacterial infection, they usually can be treated with the use of antibiotics. The choice of antibiotic and length of treatment depend on your symptoms and the type of bacteria causing your infection. HOME CARE INSTRUCTIONS  If you were prescribed antibiotics, take them exactly as your caregiver instructs you. Finish the medication even if you feel better after you have only taken some of the medication.  Drink enough water and fluids to keep your urine clear or pale yellow.  Avoid caffeine, tea, and carbonated beverages. They tend to irritate your bladder.  Empty your bladder often. Avoid holding  urine for long periods of time.  Empty your bladder before and after sexual intercourse.  After a bowel movement, women should cleanse from front to back. Use each tissue only once. SEEK MEDICAL CARE IF:   You have back pain.  You develop a fever.  Your symptoms do not begin to resolve within 3 days. SEEK IMMEDIATE MEDICAL CARE IF:   You have severe back pain or lower abdominal pain.  You develop chills.  You have nausea or vomiting.  You have continued burning or discomfort with urination. MAKE SURE YOU:   Understand these  instructions.  Will watch your condition.  Will get help right away if you are not doing well or get worse.   This information is not intended to replace advice given to you by your health care provider. Make sure you discuss any questions you have with your health care provider.   Document Released: 08/05/2005 Document Revised: 07/17/2015 Document Reviewed: 12/04/2011 Elsevier Interactive Patient Education 2016 [employer] Inc.  Nitrofurantoin tablets or capsules What is this medicine? NITROFURANTOIN (nye troe fyoor AN toyn) is an antibiotic. It is used to treat urinary tract infections. This medicine may be used for other purposes; ask your health care provider or pharmacist if you have questions. What should I tell my health care provider before I take this medicine? They need to know if you have any of these conditions: -anemia -diabetes -glucose-6-phosphate dehydrogenase deficiency -kidney disease -liver disease -lung disease -other chronic illness -an unusual or allergic reaction to nitrofurantoin, other antibiotics, other medicines, foods, dyes or preservatives -pregnant or trying to get pregnant -breast-feeding How should I use this medicine? Take this medicine by mouth with a glass of water. Follow the directions on the prescription label. Take this medicine with food or milk. Take your doses at regular intervals. Do not take your medicine  more often than directed. Do not stop taking except on your doctor's advice. Talk to your pediatrician regarding the use of this medicine in children. While this drug may be prescribed for selected conditions, precautions do apply. Overdosage: If you think you have taken too much of this medicine contact a poison control center or emergency room at once. NOTE: This medicine is only for you. Do not share this medicine with others. What if I miss a dose? If you miss a dose, take it as soon as you can. If it is almost time for your next dose, take only that dose. Do not take double or extra doses. What may interact with this medicine? -antacids containing magnesium trisilicate -probenecid -quinolone antibiotics like ciprofloxacin, lomefloxacin, norfloxacin and ofloxacin -sulfinpyrazone This list may not describe all possible interactions. Give your health care provider a list of all the medicines, herbs, non-prescription drugs, or dietary supplements you use. Also tell them if you smoke, drink alcohol, or use illegal drugs. Some items may interact with your medicine. What should I watch for while using this medicine? Tell your doctor or health care professional if your symptoms do not improve or if you get new symptoms. Drink several glasses of water a day. If you are taking this medicine for a long time, visit your doctor for regular checks on your progress. If you are diabetic, you may get a false positive result for sugar in your urine with certain brands of urine tests. Check with your doctor. What side effects may I notice from receiving this medicine? Side effects that you should report to your doctor or health care professional as soon as possible: -allergic reactions like skin rash or hives, swelling of the face, lips, or tongue -chest pain -cough -difficulty breathing -dizziness, drowsiness -fever or infection -joint aches or pains -pale or blue-tinted skin -redness, blistering,  peeling or loosening of the skin, including inside the mouth -tingling, burning, pain, or numbness in hands or feet -unusual bleeding or bruising -unusually weak or tired -yellowing of eyes or skin Side effects that usually do not require medical attention (report to your doctor or health care professional if they continue or are bothersome): -dark urine -diarrhea -headache -loss of appetite -nausea or  vomiting -temporary hair loss This list may not describe all possible side effects. Call your doctor for medical advice about side effects. You may report side effects to FDA at 1-800-FDA-1088. Where should I keep my medicine? Keep out of the reach of children. Store at room temperature between 15 and 30 degrees C (59 and 86 degrees F). Protect from light. Throw away any unused medicine after the expiration date. NOTE: This sheet is a summary. It may not cover all possible information. If you have questions about this medicine, talk to your doctor, pharmacist, or health care provider.    2016, Elsevier/[employer] Standard. (2008-05-16 15:56:47)  Benzonatate capsules What is this medicine? BENZONATATE (ben ZOE na tate) is used to treat cough. This medicine may be used for other purposes; ask your health care provider or pharmacist if you have questions. What should I tell my health care provider before I take this medicine? They need to know if you have any of these conditions: -kidney or liver disease -an unusual or allergic reaction to benzonatate, anesthetics, other medicines, foods, dyes, or preservatives -pregnant or trying to get pregnant -breast-feeding How should I use this medicine? Take this medicine by mouth with a glass of water. Follow the directions on the prescription label. Avoid breaking, chewing, or sucking the capsule, as this can cause serious side effects. Take your medicine at regular intervals. Do not take your medicine more often than directed. Talk to your  pediatrician regarding the use of this medicine in children. While this drug may be prescribed for children as young as 76 years old for selected conditions, precautions do apply. Overdosage: If you think you have taken too much of this medicine contact a poison control center or emergency room at once. NOTE: This medicine is only for you. Do not share this medicine with others. What if I miss a dose? If you miss a dose, take it as soon as you can. If it is almost time for your next dose, take only that dose. Do not take double or extra doses. What may interact with this medicine? Do not take this medicine with any of the following medications: -MAOIs like Carbex, Eldepryl, Marplan, Nardil, and Parnate This list may not describe all possible interactions. Give your health care provider a list of all the medicines, herbs, non-prescription drugs, or dietary supplements you use. Also tell them if you smoke, drink alcohol, or use illegal drugs. Some items may interact with your medicine. What should I watch for while using this medicine? Tell your doctor if your symptoms do not improve or if they get worse. If you have a high fever, skin rash, or headache, see your health care professional. You may get drowsy or dizzy. Do not drive, use machinery, or do anything that needs mental alertness until you know how this medicine affects you. Do not sit or stand up quickly, especially if you are an older patient. This reduces the risk of dizzy or fainting spells. What side effects may I notice from receiving this medicine? Side effects that you should report to your doctor or health care professional as soon as possible: -allergic reactions like skin rash, itching or hives, swelling of the face, lips, or tongue -breathing problems -chest pain -confusion or hallucinations -irregular heartbeat -numbness of mouth or throat -seizures Side effects that usually do not require medical attention (report to your  doctor or health care professional if they continue or are bothersome): -burning feeling in the eyes -constipation -headache -nasal congestion -  stomach upset This list may not describe all possible side effects. Call your doctor for medical advice about side effects. You may report side effects to FDA at 1-800-FDA-1088. Where should I keep my medicine? Keep out of the reach of children. Store at room temperature between 15 and 30 degrees C (59 and 86 degrees F). Keep tightly closed. Protect from light and moisture. Throw away any unused medicine after the expiration date. NOTE: This sheet is a summary. It may not cover all possible information. If you have questions about this medicine, talk to your doctor, pharmacist, or health care provider.    2016, [employer] Standard. (2008-01-25 14:52:56)  Acetaminophen; Hydrocodone tablets or capsules What is this medicine? ACETAMINOPHEN; HYDROCODONE (a set a MEE noe fen; hye droe KOE done) is a pain reliever. It is used to treat moderate to severe pain. This medicine may be used for other purposes; ask your health care provider or pharmacist if you have questions. What should I tell my health care provider before I take this medicine? They need to know if you have any of these conditions: -brain tumor -Crohn's disease, inflammatory bowel disease, or ulcerative colitis -drug abuse or addiction -head injury -heart or circulation problems -if you often drink alcohol -kidney disease or problems going to the bathroom -liver disease -lung disease, asthma, or breathing problems -an unusual or allergic reaction to acetaminophen, hydrocodone, other opioid analgesics, other medicines, foods, dyes, or preservatives -pregnant or trying to get pregnant -breast-feeding How should I use this medicine? Take this medicine by mouth. Swallow it with a full glass of water. Follow the directions on the prescription label. If the medicine upsets your stomach,  take the medicine with food or milk. Do not take more than you are told to take. Talk to your pediatrician regarding the use of this medicine in children. This medicine is not approved for use in children. Patients over 65 years may have a stronger reaction and need a smaller dose. Overdosage: If you think you have taken too much of this medicine contact a poison control center or emergency room at once. NOTE: This medicine is only for you. Do not share this medicine with others. What if I miss a dose? If you miss a dose, take it as soon as you can. If it is almost time for your next dose, take only that dose. Do not take double or extra doses. What may interact with this medicine? -alcohol -antihistamines -isoniazid -medicines for depression, anxiety, or psychotic disturbances -medicines for sleep -muscle relaxants -naltrexone -narcotic medicines (opiates) for pain -phenobarbital -ritonavir -tramadol This list may not describe all possible interactions. Give your health care provider a list of all the medicines, herbs, non-prescription drugs, or dietary supplements you use. Also tell them if you smoke, drink alcohol, or use illegal drugs. Some items may interact with your medicine. What should I watch for while using this medicine? Tell your doctor or health care professional if your pain does not go away, if it gets worse, or if you have new or a different type of pain. You may develop tolerance to the medicine. Tolerance means that you will need a higher dose of the medicine for pain relief. Tolerance is normal and is expected if you take the medicine for a long time. Do not suddenly stop taking your medicine because you may develop a severe reaction. Your body becomes used to the medicine. This does NOT mean you are addicted. Addiction is a behavior related to getting and  using a drug for a non-medical reason. If you have pain, you have a medical reason to take pain medicine. Your doctor  will tell you how much medicine to take. If your doctor wants you to stop the medicine, the dose will be slowly lowered over time to avoid any side effects. You may get drowsy or dizzy when you first start taking the medicine or change doses. Do not drive, use machinery, or do anything that may be dangerous until you know how the medicine affects you. Stand or sit up slowly. There are different types of narcotic medicines (opiates) for pain. If you take more than one type at the same time, you may have more side effects. Give your health care provider a list of all medicines you use. Your doctor will tell you how much medicine to take. Do not take more medicine than directed. Call emergency for help if you have problems breathing. The medicine will cause constipation. Try to have a bowel movement at least every 2 to 3 days. If you do not have a bowel movement for 3 days, call your doctor or health care professional. Too much acetaminophen can be very dangerous. Do not take Tylenol (acetaminophen) or medicines that contain acetaminophen with this medicine. Many non-prescription medicines contain acetaminophen. Always read the labels carefully. What side effects may I notice from receiving this medicine? Side effects that you should report to your doctor or health care professional as soon as possible: -allergic reactions like skin rash, itching or hives, swelling of the face, lips, or tongue -breathing problems -confusion -feeling faint or lightheaded, falls -stomach pain -yellowing of the eyes or skin Side effects that usually do not require medical attention (report to your doctor or health care professional if they continue or are bothersome): -nausea, vomiting -stomach upset This list may not describe all possible side effects. Call your doctor for medical advice about side effects. You may report side effects to FDA at 1-800-FDA-1088. Where should I keep my medicine? Keep out of the reach of  children. This medicine can be abused. Keep your medicine in a safe place to protect it from theft. Do not share this medicine with anyone. Selling or giving away this medicine is dangerous and against the law. This medicine may cause accidental overdose and death if it taken by other adults, children, or pets. Mix any unused medicine with a substance like cat litter or coffee grounds. Then throw the medicine away in a sealed container like a sealed bag or a coffee can with a lid. Do not use the medicine after the expiration date. Store at room temperature between 15 and 30 degrees C (59 and 86 degrees F). NOTE: This sheet is a summary. It may not cover all possible information. If you have questions about this medicine, talk to your doctor, pharmacist, or health care provider.    2016, [employer] Standard. (2014-09-26 15:29:20)

## 2016-01-01 ENCOUNTER — Ambulatory Visit (INDEPENDENT_AMBULATORY_CARE_PROVIDER_SITE_OTHER): Payer: BC Managed Care – PPO | Admitting: Family Medicine

## 2016-01-01 ENCOUNTER — Ambulatory Visit (INDEPENDENT_AMBULATORY_CARE_PROVIDER_SITE_OTHER): Payer: BC Managed Care – PPO

## 2016-01-01 VITALS — BP 110/74 | HR 60 | Temp 98.1°F | Resp 18 | Ht 63.0 in | Wt 166.4 lb

## 2016-01-01 DIAGNOSIS — IMO0001 Reserved for inherently not codable concepts without codable children: Secondary | ICD-10-CM

## 2016-01-01 DIAGNOSIS — E860 Dehydration: Secondary | ICD-10-CM | POA: Diagnosis not present

## 2016-01-01 DIAGNOSIS — R55 Syncope and collapse: Secondary | ICD-10-CM | POA: Diagnosis not present

## 2016-01-01 DIAGNOSIS — J111 Influenza due to unidentified influenza virus with other respiratory manifestations: Secondary | ICD-10-CM

## 2016-01-01 DIAGNOSIS — S40021A Contusion of right upper arm, initial encounter: Secondary | ICD-10-CM

## 2016-01-01 DIAGNOSIS — R6889 Other general symptoms and signs: Secondary | ICD-10-CM | POA: Diagnosis not present

## 2016-01-01 DIAGNOSIS — S0033XA Contusion of nose, initial encounter: Secondary | ICD-10-CM | POA: Diagnosis not present

## 2016-01-01 DIAGNOSIS — J9801 Acute bronchospasm: Secondary | ICD-10-CM

## 2016-01-01 DIAGNOSIS — S40011A Contusion of right shoulder, initial encounter: Secondary | ICD-10-CM

## 2016-01-01 LAB — CBC WITH DIFFERENTIAL/PLATELET
BASOS ABS: 0 10*3/uL (ref 0.0–0.1)
BASOS PCT: 0 % (ref 0–1)
Eosinophils Absolute: 0 10*3/uL (ref 0.0–0.7)
Eosinophils Relative: 0 % (ref 0–5)
HEMATOCRIT: 39.7 % (ref 36.0–46.0)
HEMOGLOBIN: 13.4 g/dL (ref 12.0–15.0)
LYMPHS PCT: 32 % (ref 12–46)
Lymphs Abs: 1.2 10*3/uL (ref 0.7–4.0)
MCH: 29.6 pg (ref 26.0–34.0)
MCHC: 33.8 g/dL (ref 30.0–36.0)
MCV: 87.8 fL (ref 78.0–100.0)
MONO ABS: 0.4 10*3/uL (ref 0.1–1.0)
MPV: 10.1 fL (ref 8.6–12.4)
Monocytes Relative: 11 % (ref 3–12)
NEUTROS ABS: 2.2 10*3/uL (ref 1.7–7.7)
NEUTROS PCT: 57 % (ref 43–77)
Platelets: 143 10*3/uL — ABNORMAL LOW (ref 150–400)
RBC: 4.52 MIL/uL (ref 3.87–5.11)
RDW: 13.4 % (ref 11.5–15.5)
WBC: 3.8 10*3/uL — AB (ref 4.0–10.5)

## 2016-01-01 LAB — COMPREHENSIVE METABOLIC PANEL
ALBUMIN: 3.6 g/dL (ref 3.6–5.1)
ALK PHOS: 59 U/L (ref 33–130)
ALT: 12 U/L (ref 6–29)
AST: 18 U/L (ref 10–35)
BILIRUBIN TOTAL: 0.4 mg/dL (ref 0.2–1.2)
BUN: 13 mg/dL (ref 7–25)
CALCIUM: 8.6 mg/dL (ref 8.6–10.4)
CO2: 30 mmol/L (ref 20–31)
CREATININE: 0.93 mg/dL (ref 0.50–0.99)
Chloride: 104 mmol/L (ref 98–110)
Glucose, Bld: 77 mg/dL (ref 65–99)
Potassium: 3.3 mmol/L — ABNORMAL LOW (ref 3.5–5.3)
SODIUM: 140 mmol/L (ref 135–146)
TOTAL PROTEIN: 6.4 g/dL (ref 6.1–8.1)

## 2016-01-01 LAB — POCT INFLUENZA A/B
Influenza A, POC: NEGATIVE
Influenza B, POC: NEGATIVE

## 2016-01-01 LAB — URINE CULTURE

## 2016-01-01 MED ORDER — OSELTAMIVIR PHOSPHATE 75 MG PO CAPS: 75.0000 mg | ORAL_CAPSULE | Freq: Two times a day (BID) | ORAL | Status: DC

## 2016-01-01 MED ORDER — MUCINEX DM 30-600 MG PO TB12: 1.0000 | ORAL_TABLET | Freq: Two times a day (BID) | ORAL | Status: DC

## 2016-01-01 MED ORDER — PREDNISONE 20 MG PO TABS: ORAL_TABLET | ORAL | Status: DC

## 2016-01-01 MED ORDER — ALBUTEROL SULFATE (2.5 MG/3ML) 0.083% IN NEBU
2.5000 mg | INHALATION_SOLUTION | Freq: Once | RESPIRATORY_TRACT | Status: AC
  Administered 2016-01-01: 2.5 mg via RESPIRATORY_TRACT

## 2016-01-01 NOTE — Progress Notes (Signed)
Subjective:    Patient ID: Kelli Contreras, female    DOB: 1951/10/15, 65 y.o.   MRN: [id number]  01/01/2016  Follow-up   HPI This 65 y.o. female presents for [place].    S/p cataract surgery 12/23/15.    Onset 12/24/15.  Started with malaise and fatigue.  No fever.  +chills; no sweats.  +body aches. +HA.  No ear pain.  ST mild and scratchy; seems swollen.  +rhinorrhea; +nasal congestion. +coughing.  No SOB.  +diarrhea.  No vomiting.   Stayed out of work the following days.  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  [facility] [place].  Evaluated and BP was low and pulse was 120; recommended ED evaluation.  Refused ambulance transport; daughter transported.  S/p labs, EKG, CXR; iv fluids.    Rx tessalon perles, hydrocodone, Macrobid.   Review of Systems  Constitutional: Positive for chills and fatigue. Negative for fever and diaphoresis.  HENT: Positive for congestion, rhinorrhea and sore throat. Negative for ear pain.   Respiratory: Positive for cough. Negative for shortness of breath and wheezing.   Gastrointestinal: Positive for diarrhea. Negative for nausea and vomiting.  Neurological: Positive for headaches.    Past Medical History  Diagnosis Date  . Hypertension   . Diarrhea   . Plantar fasciitis   . Depression   . Insomnia   . Cataract   . IBS (irritable bowel syndrome)     diarrhea  . Migraine    Past Surgical History  Procedure Laterality Date  . Vesicovaginal fistula closure w/ tah  1985    Dr. Ubaldo Glassing  . Bunionectomy  2007    Dr. Beola Cord  . Abdominal hysterectomy      DUB; ovaries intact.   Allergies  Allergen Reactions  . Other Other (See Comments)    Onions & Chocolate cause migraine    Social History   Social History  . Marital Status: Widowed    Spouse Name: N/A  . Number of Children: 2  . Years of Education: N/A   Occupational History  . Not on file.   Social History Main Topics  . Smoking status: Never  Smoker   . Smokeless tobacco: Never Used  . Alcohol Use: No  . Drug Use: No  . Sexual Activity: Not on file   Other Topics Concern  . Not on file   Social History Narrative   Marital status: widowed since 2008 after 57 years of marriage; not dating      Children: 2; 1 grandchildren      Lives: alone      Employment:  Control and instrumentation engineer kindergarten x 22 years; Psychologist, sport and exercise      Tobacco: none      Alcohol: none   One grandchild.   Building control surveyor.      Exercise:  Walking once per week         Family History  Problem Relation Age of Onset  . Hypertension      mother, father, brother  . CAD Father 32  . Cancer Father     bladder cancer  . Diabetes Father   . Heart disease Father   . Hyperlipidemia Father   . Hypertension Father   . Valvular heart disease Mother     MVP  . Heart disease Mother   . Hyperlipidemia Mother   . Hypertension Mother   . Hypertension Brother        Objective:    BP 110/74  mmHg  Pulse 60  Temp(Src) 98.1 F (36.7 C) (Oral)  Resp 18  Ht '5\' 3"'  (1.6 m)  Wt 166 lb 6.4 oz (75.479 kg)  BMI 29.48 kg/m2  SpO2 98% Physical Exam  Constitutional: She is oriented to person, place, and time. She appears well-developed and well-nourished. No distress.  HENT:  Head: Normocephalic and atraumatic.  Right Ear: Tympanic membrane, external ear and ear canal normal.  Left Ear: Tympanic membrane, external ear and ear canal normal.  Nose: Rhinorrhea and sinus tenderness present. No septal deviation or nasal septal hematoma. No epistaxis.  No foreign bodies. Right sinus exhibits maxillary sinus tenderness and frontal sinus tenderness. Left sinus exhibits maxillary sinus tenderness and frontal sinus tenderness.  Mouth/Throat: Oropharynx is clear and moist.  Eyes: Conjunctivae are normal. Pupils are equal, round, and reactive to light.  Neck: Normal range of motion. Neck supple.  Cardiovascular: Normal rate, regular rhythm and normal heart sounds.   Exam reveals no gallop and no friction rub.   No murmur heard. Pulmonary/Chest: Effort normal and breath sounds normal. She has no wheezes. She has no rales.  Musculoskeletal:       Right shoulder: She exhibits tenderness and bony tenderness. She exhibits normal range of motion, no swelling, no pain, no spasm, normal pulse and normal strength.       Left shoulder: Normal. She exhibits normal range of motion, no tenderness, no bony tenderness, no pain, no spasm, normal pulse and normal strength.  Lymphadenopathy:    She has no cervical adenopathy.  Neurological: She is alert and oriented to person, place, and time. No cranial nerve deficit. She exhibits normal muscle tone. Coordination normal.  Skin: No rash noted. She is not diaphoretic.  Psychiatric: She has a normal mood and affect. Her behavior is normal. Judgment and thought content normal.  Nursing note and vitals reviewed.  Results for orders placed or performed in visit on 01/01/16  CBC with Differential/Platelet  Result Value Ref Range   WBC 3.8 (L) 4.0 - 10.5 K/uL   RBC 4.52 3.87 - 5.11 MIL/uL   Hemoglobin 13.4 12.0 - 15.0 g/dL   HCT 39.7 36.0 - 46.0 %   MCV 87.8 78.0 - 100.0 fL   MCH 29.6 26.0 - 34.0 pg   MCHC 33.8 30.0 - 36.0 g/dL   RDW 13.4 11.5 - 15.5 %   Platelets 143 (L) 150 - 400 K/uL   MPV 10.1 8.6 - 12.4 fL   Neutrophils Relative % 57 43 - 77 %   Neutro Abs 2.2 1.7 - 7.7 K/uL   Lymphocytes Relative 32 12 - 46 %   Lymphs Abs 1.2 0.7 - 4.0 K/uL   Monocytes Relative 11 3 - 12 %   Monocytes Absolute 0.4 0.1 - 1.0 K/uL   Eosinophils Relative 0 0 - 5 %   Eosinophils Absolute 0.0 0.0 - 0.7 K/uL   Basophils Relative 0 0 - 1 %   Basophils Absolute 0.0 0.0 - 0.1 K/uL   Smear Review Criteria for review not met   Comprehensive metabolic panel  Result Value Ref Range   Sodium 140 135 - 146 mmol/L   Potassium 3.3 (L) 3.5 - 5.3 mmol/L   Chloride 104 98 - 110 mmol/L   CO2 30 20 - 31 mmol/L   Glucose, Bld 77 65 - 99 mg/dL     BUN 13 7 - 25 mg/dL   Creat 0.93 0.50 - 0.99 mg/dL   Total Bilirubin 0.4 0.2 - 1.2 mg/dL  Alkaline Phosphatase 59 33 - 130 U/L   AST 18 10 - 35 U/L   ALT 12 6 - 29 U/L   Total Protein 6.4 6.1 - 8.1 g/dL   Albumin 3.6 3.6 - 5.1 g/dL   Calcium 8.6 8.6 - 10.4 mg/dL  TSH  Result Value Ref Range   TSH 3.44 mIU/L  POCT Influenza A/B  Result Value Ref Range   Influenza A, POC Negative Negative   Influenza B, POC Negative Negative       Assessment & Plan:   1. Syncope and collapse   2. Influenza   3. Flu-like symptoms   4. Contusion, nose, initial encounter   5. Contusion shoulder/arm, right, initial encounter   6. Bronchospasm   7. Dehydration     Orders Placed This Encounter  Procedures  . DG Chest 2 View    Standing Status: Future     Number of Occurrences: 1     Standing Expiration Date: 12/31/2016    Order Specific Question:  Reason for Exam (SYMPTOM  OR DIAGNOSIS REQUIRED)    Answer:  chest pain/tightness    Order Specific Question:  Preferred imaging location?    Answer:  External  . CBC with Differential/Platelet  . Comprehensive metabolic panel  . TSH  . Ambulatory referral to Cardiology    Referral Priority:  Routine    Referral Type:  Consultation    Referral Reason:  Specialty Services Required    Requested Specialty:  Cardiology    Number of Visits Requested:  1  . POCT Influenza A/B  . EKG 12-Lead   Meds ordered this encounter  Medications  . albuterol (PROVENTIL) (2.5 MG/3ML) 0.083% nebulizer solution 2.5 mg    Sig:   . oseltamivir (TAMIFLU) 75 MG capsule    Sig: Take 1 capsule (75 mg total) by mouth 2 (two) times daily.    Dispense:  10 capsule    Refill:  0  . predniSONE (DELTASONE) 20 MG tablet    Sig: Two tablets daily x 5 days then one tablet daily x 5 days    Dispense:  15 tablet    Refill:  0  . Dextromethorphan-Guaifenesin (MUCINEX DM) 30-600 MG TB12    Sig: Take 1 tablet by mouth 2 (two) times daily.    Dispense:  28 each     Refill:  0    No Follow-up on file.    Mykelti Goldenstein Elayne Guerin, M.D. [facility] [address] [place], [place]  [address] [phone] phone [phone] fax

## 2016-01-01 NOTE — Patient Instructions (Addendum)
Because you received an x-ray today, you will receive an invoice from [facility]. Please contact [facility] at [phone] with questions or concerns regarding your invoice. Our billing staff will not be able to assist you with those questions.  1.  Call if you are not starting to feel better in the upcoming 72 hours.   Influenza, Adult Influenza ("the flu") is a viral infection of the respiratory tract. It occurs more often in winter months because people spend more time in close contact with one another. Influenza can make you feel very sick. Influenza easily spreads from person to person (contagious). CAUSES  Influenza is caused by a virus that infects the respiratory tract. You can catch the virus by breathing in droplets from an infected person's cough or sneeze. You can also catch the virus by touching something that was recently contaminated with the virus and then touching your mouth, nose, or eyes. RISKS AND COMPLICATIONS You may be at risk for a more severe case of influenza if you smoke cigarettes, have diabetes, have chronic heart disease (such as heart failure) or lung disease (such as asthma), or if you have a weakened immune system. Elderly people and pregnant women are also at risk for more serious infections. The most common problem of influenza is a lung infection (pneumonia). Sometimes, this problem can require emergency medical care and may be life threatening. SIGNS AND SYMPTOMS  Symptoms typically last 4 to 10 days and may include:  Fever.  Chills.  Headache, body aches, and muscle aches.  Sore throat.  Chest discomfort and cough.  Poor appetite.  Weakness or feeling tired.  Dizziness.  Nausea or vomiting. DIAGNOSIS  Diagnosis of influenza is often made based on your history and a physical exam. A nose or throat swab test can be done to confirm the diagnosis. TREATMENT  In mild cases, influenza goes away on its own. Treatment is directed at  relieving symptoms. For more severe cases, your health care provider may prescribe antiviral medicines to shorten the sickness. Antibiotic medicines are not effective because the infection is caused by a virus, not by bacteria. HOME CARE INSTRUCTIONS  Take medicines only as directed by your health care provider.  Use a cool mist humidifier to make breathing easier.  Get plenty of rest until your temperature returns to normal. This usually takes 3 to 4 days.  Drink enough fluid to keep your urine clear or pale yellow.  Cover yourmouth and nosewhen coughing or sneezing,and wash your handswellto prevent thevirusfrom spreading.  Stay homefromwork orschool untilthe fever is gonefor at least 61full day. PREVENTION  An annual influenza vaccination (flu shot) is the best way to avoid getting influenza. An annual flu shot is now routinely recommended for all adults in the [place] SEEK MEDICAL CARE IF:  You experiencechest pain, yourcough worsens,or you producemore mucus.  Youhave nausea,vomiting, ordiarrhea.  Your fever returns or gets worse. SEEK IMMEDIATE MEDICAL CARE IF:  You havetrouble breathing, you become short of breath,or your skin ornails becomebluish.  You have severe painor stiffnessin the neck.  You develop a sudden headache, or pain in the face or ear.  You have nausea or vomiting that you cannot control. MAKE SURE YOU:   Understand these instructions.  Will watch your condition.  Will get help right away if you are not doing well or get worse.   This information is not intended to replace advice given to you by your health care provider. Make sure you discuss any questions you  have with your health care provider.   Document Released: 10/23/2000 Document Revised: 11/16/2014 Document Reviewed: 01/25/2012 Elsevier Interactive Patient Education [employer]

## 2016-01-02 LAB — TSH: TSH: 3.44 mIU/L

## 2016-01-30 ENCOUNTER — Telehealth: Payer: Self-pay | Admitting: *Deleted

## 2016-01-30 NOTE — Telephone Encounter (Signed)
Notes Recorded by Ardean LarsenMichelle B Morehead, CMA on 01/25/2016 at 1:01 PM Clld pt - Advsd of lab results. Pt states she is taking her potassium supplement daily as instructed. Pt also advised that she saw her cardiologist - Dr. Vyas([facility]) on Thursday, March 16th - he wants to take her off HCTZ and Potassium. She states she has not stopped taking the medications cause she wanted to talk to you first. I advised pt that I would send you a message regarding her concern and that she can continue taking medications until she's contacted from this office. Please advise.

## 2016-01-30 NOTE — Telephone Encounter (Signed)
-----   Message from Ethelda ChickKristi M Smith, MD sent at 01/21/2016 10:45 PM EDT ----- 1. Thyroid function is normal.  2. Potassium is slightly low again at 3.3 which is a repeat finding each time you have labs drawn.  Are you taking your potassium supplement daily? 3.  Liver and kidney functions are normal.  4. WBC count is slightly low which is due to recent flu infection.    5.  Platelet is borderline low which is a chronic finding; recommend repeating at next visit as well.

## 2016-01-31 NOTE — Telephone Encounter (Signed)
Please return call --- I think stopping the HCTZ and potassium is fine to do.  I would recommend patient checking her BP daily for two weeks after stopping HCTZ and potassium.  She should contact me if BP consistently > 140/90 off of HCTZ.

## 2016-02-01 NOTE — Telephone Encounter (Signed)
Left detailed message on machine.

## 2016-03-02 ENCOUNTER — Other Ambulatory Visit: Payer: Self-pay | Admitting: Family Medicine

## 2016-03-26 ENCOUNTER — Other Ambulatory Visit: Payer: Self-pay | Admitting: Family Medicine

## 2016-04-02 ENCOUNTER — Other Ambulatory Visit: Payer: Self-pay | Admitting: Family Medicine

## 2016-05-16 ENCOUNTER — Ambulatory Visit (INDEPENDENT_AMBULATORY_CARE_PROVIDER_SITE_OTHER): Payer: BC Managed Care – PPO | Admitting: Family Medicine

## 2016-05-16 VITALS — BP 100/70 | HR 70 | Temp 97.7°F | Resp 16 | Ht 62.0 in | Wt 164.8 lb

## 2016-05-16 DIAGNOSIS — G43709 Chronic migraine without aura, not intractable, without status migrainosus: Secondary | ICD-10-CM | POA: Diagnosis not present

## 2016-05-16 DIAGNOSIS — Z114 Encounter for screening for human immunodeficiency virus [HIV]: Secondary | ICD-10-CM | POA: Diagnosis not present

## 2016-05-16 DIAGNOSIS — I10 Essential (primary) hypertension: Secondary | ICD-10-CM | POA: Diagnosis not present

## 2016-05-16 DIAGNOSIS — K219 Gastro-esophageal reflux disease without esophagitis: Secondary | ICD-10-CM

## 2016-05-16 DIAGNOSIS — G4700 Insomnia, unspecified: Secondary | ICD-10-CM | POA: Diagnosis not present

## 2016-05-16 DIAGNOSIS — Z1159 Encounter for screening for other viral diseases: Secondary | ICD-10-CM | POA: Diagnosis not present

## 2016-05-16 DIAGNOSIS — R0789 Other chest pain: Secondary | ICD-10-CM

## 2016-05-16 MED ORDER — TOPIRAMATE 100 MG PO TABS: 100.0000 mg | ORAL_TABLET | Freq: Two times a day (BID) | ORAL | Status: DC

## 2016-05-16 MED ORDER — POTASSIUM CHLORIDE CRYS ER 20 MEQ PO TBCR: EXTENDED_RELEASE_TABLET | ORAL | Status: DC

## 2016-05-16 MED ORDER — METOPROLOL SUCCINATE ER 50 MG PO TB24: 50.0000 mg | ORAL_TABLET | Freq: Every day | ORAL | Status: DC

## 2016-05-16 MED ORDER — ESCITALOPRAM OXALATE 10 MG PO TABS: 10.0000 mg | ORAL_TABLET | Freq: Every day | ORAL | Status: DC

## 2016-05-16 MED ORDER — LISINOPRIL 20 MG PO TABS: 20.0000 mg | ORAL_TABLET | Freq: Every day | ORAL | Status: DC

## 2016-05-16 MED ORDER — OMEPRAZOLE 20 MG PO CPDR: 20.0000 mg | DELAYED_RELEASE_CAPSULE | Freq: Every day | ORAL | Status: DC

## 2016-05-16 MED ORDER — ELETRIPTAN HYDROBROMIDE 20 MG PO TABS: 20.0000 mg | ORAL_TABLET | ORAL | Status: DC | PRN

## 2016-05-16 MED ORDER — IMIPRAMINE HCL 50 MG PO TABS: 100.0000 mg | ORAL_TABLET | Freq: Every day | ORAL | Status: DC

## 2016-05-16 MED ORDER — ZOLPIDEM TARTRATE 10 MG PO TABS: 10.0000 mg | ORAL_TABLET | Freq: Every evening | ORAL | Status: DC | PRN

## 2016-05-16 NOTE — Patient Instructions (Signed)
     IF you received an x-ray today, you will receive an invoice from [facility]. Please contact [facility] at [phone] with questions or concerns regarding your invoice.   IF you received labwork today, you will receive an invoice from [facility]. Please contact [facility] at [phone] with questions or concerns regarding your invoice.   Our billing staff will not be able to assist you with questions regarding bills from these companies.  You will be contacted with the lab results as soon as they are available. The fastest way to get your results is to activate your My Chart account. Instructions are located on the last page of this paperwork. If you have not heard from us regarding the results in 2 weeks, please contact this office.      

## 2016-05-16 NOTE — Progress Notes (Signed)
Subjective:    Patient ID: Kelli Contreras, female    DOB: 09-21-1951, 65 y.o.   MRN: [id number]  05/16/2016  Medication Refill (Ambien, Topamax and majority of the rest)   HPI This 65 y.o. female presents for five month follow-up:  1.  Fatigue: no sleep study but does not want to undergo that.  Still paying for stress test.  Not sleeping well; usually 3-4 hours per night.  If goes to sleep, good; getting to sleep is the problem; out of Ambien; usually takes as needed.    2.  Anxiety and depression: Patient reports good compliance with medication, good tolerance to medication, and good symptom control.  Taking Lexapro daily.    3. HTN: Patient reports good compliance with medication, good tolerance to medication, and good symptom control.  Home BP running 120/80.  HCTZ 25mg  daily. Lisionpril 20mg  daily; Metoprolol 50mg  daily.    4. Chest pain: still having chest pain; had pain the other night; awoke with chest pain recently; usually always vomits when has chest pain.  Gets nauseated with chest pain.  S/p stress testing and echo and all WNL.  No follow-up.  Chest pain occurs sporadically; can go a month without chest pain.  Not always walking with onset; when at work, if gets stressed, will get chest pain.  Stress is a trigger.  No reflux or indigestion or heartburn.  Some SOB with chest pain.  +diaphoresis.  No exercise currently; likes to walk; no walking since getting so hot.  Last exercise 2 months ago.   Will get chest pain with exertion; when started stress test, developed chest pain and nausea; no changes on stress testing. With non-exercise stress test, no nausea or chest pain.  No associated abdominal pain; no radiation.  Had a severe headache; took migraine tablet and made chest pain recur.  5.  Migraines: taking Topamax, Imipramine; having much less often; Imitrex causes chest pain.     Review of Systems  Constitutional: Positive for fatigue. Negative for chills, diaphoresis and  fever.  Eyes: Negative for visual disturbance.  Respiratory: Negative for cough and shortness of breath.   Cardiovascular: Positive for chest pain. Negative for palpitations and leg swelling.  Gastrointestinal: Negative for nausea, vomiting, abdominal pain, diarrhea and constipation.  Endocrine: Negative for cold intolerance, heat intolerance, polydipsia, polyphagia and polyuria.  Neurological: Positive for headaches. Negative for dizziness, tremors, seizures, syncope, facial asymmetry, speech difficulty, weakness, light-headedness and numbness.  Psychiatric/Behavioral: Positive for sleep disturbance. Negative for dysphoric mood, self-injury and suicidal ideas. The patient is not nervous/anxious.     Past Medical History:  Diagnosis Date  . Cataract   . Depression   . Diarrhea   . Hypertension   . IBS (irritable bowel syndrome)    diarrhea  . Insomnia   . Migraine   . Plantar fasciitis    Past Surgical History:  Procedure Laterality Date  . ABDOMINAL HYSTERECTOMY     DUB; ovaries intact.  [place]  2007   Dr. Lestine Box  . VESICOVAGINAL FISTULA CLOSURE W/ TAH  1985   Dr. Nicholas Lose   Allergies  Allergen Reactions  . Other Other (See Comments)    Onions & Chocolate cause migraine   Current Outpatient Prescriptions  Medication Sig Dispense Refill  . aspirin 81 MG tablet Take 81 mg by mouth at bedtime.     Marland Kitchen Dextromethorphan-Guaifenesin (MUCINEX DM) 30-600 MG TB12 Take 1 tablet by mouth 2 (two) times daily. 28 each 0  . escitalopram (  LEXAPRO) 10 MG tablet Take 1-1.5 tablets (10-15 mg total) by mouth at bedtime. 135 tablet 1  . HYDROcodone-acetaminophen (NORCO) 5-325 MG tablet Take 1 tablet by mouth every 4 (four) hours as needed for moderate pain (or cough). 10 tablet 0  . ibuprofen (ADVIL,MOTRIN) 200 MG tablet Take 400 mg by mouth every 6 (six) hours as needed for moderate pain.     Marland Kitchen. imipramine (TOFRANIL) 50 MG tablet Take 2 tablets (100 mg total) by mouth at bedtime. 180  tablet 1  . lisinopril (PRINIVIL,ZESTRIL) 20 MG tablet Take 1 tablet (20 mg total) by mouth daily. 90 tablet 1  . metoprolol succinate (TOPROL-XL) 50 MG 24 hr tablet Take 1 tablet (50 mg total) by mouth daily. Take with or immediately following a meal. 90 tablet 1  . potassium chloride SA (KLOR-CON M20) 20 MEQ tablet One tablet daily 90 tablet 1  . topiramate (TOPAMAX) 100 MG tablet Take 1 tablet (100 mg total) by mouth 2 (two) times daily. 180 tablet 1  . zolpidem (AMBIEN) 10 MG tablet Take 1 tablet (10 mg total) by mouth at bedtime as needed. 90 tablet 1  . eletriptan (RELPAX) 20 MG tablet Take 1 tablet (20 mg total) by mouth as needed for migraine or headache. May repeat in 2 hours if headache persists or recurs. 24 tablet 0  . hydrochlorothiazide (HYDRODIURIL) 25 MG tablet TAKE 1 TABLET BY MOUTH EVERY DAY 90 tablet 1  . omeprazole (PRILOSEC) 20 MG capsule Take 1 capsule (20 mg total) by mouth daily. 90 capsule 1  . SUMAtriptan (IMITREX) 100 MG tablet Take 1 tablet (100 mg total) by mouth once as needed for migraine. (Patient not taking: Reported on 05/16/2016) 30 tablet 3   No current facility-administered medications for this visit.    Social History   Social History  . Marital status: Widowed    Spouse name: N/A  . Number of children: 2  . Years of education: N/A   Occupational History  . Not on file.   Social History Main Topics  . Smoking status: Never Smoker  . Smokeless tobacco: Never Used  . Alcohol use No  . Drug use: No  . Sexual activity: Not on file   Other Topics Concern  . Not on file   Social History Narrative   Marital status: widowed since 2008 after 36 years of marriage; not dating      Children: 2; 1 grandchildren      Lives: alone      Employment:  Geologist, engineeringTeacher assistant kindergarten x 22 years; [employer]      Tobacco: none      Alcohol: none   One grandchild.   Data processing managerAssistant teacher.      Exercise:  Walking once per week         Family History    Problem Relation Age of Onset  . Hypertension      mother, father, brother  . CAD Father 3360  . Cancer Father     bladder cancer  . Diabetes Father   . Heart disease Father   . Hyperlipidemia Father   . Hypertension Father   . Valvular heart disease Mother     MVP  . Heart disease Mother   . Hyperlipidemia Mother   . Hypertension Mother   . Hypertension Brother        Objective:    BP 100/70   Pulse 70   Temp 97.7 F (36.5 C) (Oral)   Resp 16  Ht  (1.575 m)   Wt 164 lb 12.8 oz (74.8 kg)   SpO2 99%   BMI 30.14 kg/m  Physical Exam  Constitutional: She is oriented to person, place, and time. She appears well-developed and well-nourished. No distress.  HENT:  Head: Normocephalic and atraumatic.  Right Ear: External ear normal.  Left Ear: External ear normal.  Nose: Nose normal.  Mouth/Throat: Oropharynx is clear and moist.  Eyes: Conjunctivae and EOM are normal. Pupils are equal, round, and reactive to light.  Neck: Normal range of motion. Neck supple. Carotid bruit is not present. No thyromegaly present.  Cardiovascular: Normal rate, regular rhythm, normal heart sounds and intact distal pulses.  Exam reveals no gallop and no friction rub.   No murmur heard. Pulmonary/Chest: Effort normal and breath sounds normal. She has no wheezes. She has no rales.  Abdominal: Soft. Bowel sounds are normal. She exhibits no distension and no mass. There is no tenderness. There is no rebound and no guarding.  Lymphadenopathy:    She has no cervical adenopathy.  Neurological: She is alert and oriented to person, place, and time. No cranial nerve deficit. She exhibits normal muscle tone. Coordination normal.  Skin: Skin is warm and dry. No rash noted. She is not diaphoretic. No erythema. No pallor.  Psychiatric: She has a normal mood and affect. Her behavior is normal. Judgment and thought content normal.  Nursing note and vitals reviewed.       Assessment & Plan:   1.  Essential hypertension, benign   2. Other chest pain   3. Chronic migraine without aura without status migrainosus, not intractable   4. Gastroesophageal reflux disease without esophagitis   5. Insomnia   6. Screening for HIV (human immunodeficiency virus)   7. Need for hepatitis C screening test    -HTN well controlled. Obtain labs; refills provided; RTC six months. -having persistent chest pain; treat with Prilosec to see if GERD related; s/p extensive cardiac work up recently that was negative.   -obtain age appropriate screening. -depression and anxiety are stable at this time. -fatigue is ongoing.  Metoprolol can contribute to fatigue; consider weaning yet likely helping with migraine prevention.  Lexapro can also cause fatigue; consider switching to more stimulating medication.  Pt refuses to repeat sleep study.   Orders Placed This Encounter  Procedures  . CBC with Differential/Platelet  . Comprehensive metabolic panel  . HIV antibody  . Hepatitis C antibody  . EKG 12-Lead   Meds ordered this encounter  Medications  . topiramate (TOPAMAX) 100 MG tablet    Sig: Take 1 tablet (100 mg total) by mouth 2 (two) times daily.    Dispense:  180 tablet    Refill:  1  . metoprolol succinate (TOPROL-XL) 50 MG 24 hr tablet    Sig: Take 1 tablet (50 mg total) by mouth daily. Take with or immediately following a meal.    Dispense:  90 tablet    Refill:  1  . lisinopril (PRINIVIL,ZESTRIL) 20 MG tablet    Sig: Take 1 tablet (20 mg total) by mouth daily.    Dispense:  90 tablet    Refill:  1  . potassium chloride SA (KLOR-CON M20) 20 MEQ tablet    Sig: One tablet daily    Dispense:  90 tablet    Refill:  1  . imipramine (TOFRANIL) 50 MG tablet    Sig: Take 2 tablets (100 mg total) by mouth at bedtime.    Dispense:  180 tablet    Refill:  1  . omeprazole (PRILOSEC) 20 MG capsule    Sig: Take 1 capsule (20 mg total) by mouth daily.    Dispense:  90 capsule    Refill:  1  .  escitalopram (LEXAPRO) 10 MG tablet    Sig: Take 1-1.5 tablets (10-15 mg total) by mouth at bedtime.    Dispense:  135 tablet    Refill:  1    Office visit needed for refills  . eletriptan (RELPAX) 20 MG tablet    Sig: Take 1 tablet (20 mg total) by mouth as needed for migraine or headache. May repeat in 2 hours if headache persists or recurs.    Dispense:  24 tablet    Refill:  0  . zolpidem (AMBIEN) 10 MG tablet    Sig: Take 1 tablet (10 mg total) by mouth at bedtime as needed.    Dispense:  90 tablet    Refill:  1    Return in about 4 months (around 09/16/2016) for complete physical examiniation.    Avnoor Koury Paulita Fujita, M.D. [facility] [address] [place], [place]  [address] [phone] phone [phone] fax

## 2016-05-17 LAB — CBC WITH DIFFERENTIAL/PLATELET
BASOS ABS: 0 {cells}/uL (ref 0–200)
BASOS PCT: 0 %
EOS ABS: 0 {cells}/uL — AB (ref 15–500)
Eosinophils Relative: 0 %
HEMATOCRIT: 42 % (ref 35.0–45.0)
HEMOGLOBIN: 14.1 g/dL (ref 11.7–15.5)
Lymphocytes Relative: 38 %
Lymphs Abs: 2090 cells/uL (ref 850–3900)
MCH: 29.5 pg (ref 27.0–33.0)
MCHC: 33.6 g/dL (ref 32.0–36.0)
MCV: 87.9 fL (ref 80.0–100.0)
MPV: 9.9 fL (ref 7.5–12.5)
Monocytes Absolute: 550 cells/uL (ref 200–950)
Monocytes Relative: 10 %
NEUTROS PCT: 52 %
Neutro Abs: 2860 cells/uL (ref 1500–7800)
Platelets: 174 10*3/uL (ref 140–400)
RBC: 4.78 MIL/uL (ref 3.80–5.10)
RDW: 13.4 % (ref 11.0–15.0)
WBC: 5.5 10*3/uL (ref 3.8–10.8)

## 2016-05-17 LAB — COMPREHENSIVE METABOLIC PANEL
ALBUMIN: 4.1 g/dL (ref 3.6–5.1)
ALK PHOS: 80 U/L (ref 33–130)
ALT: 10 U/L (ref 6–29)
AST: 16 U/L (ref 10–35)
BILIRUBIN TOTAL: 0.6 mg/dL (ref 0.2–1.2)
BUN: 17 mg/dL (ref 7–25)
CALCIUM: 8.9 mg/dL (ref 8.6–10.4)
CO2: 30 mmol/L (ref 20–31)
CREATININE: 0.87 mg/dL (ref 0.50–0.99)
Chloride: 102 mmol/L (ref 98–110)
GLUCOSE: 94 mg/dL (ref 65–99)
Potassium: 3.8 mmol/L (ref 3.5–5.3)
SODIUM: 141 mmol/L (ref 135–146)
Total Protein: 7 g/dL (ref 6.1–8.1)

## 2016-05-17 LAB — HIV ANTIBODY (ROUTINE TESTING W REFLEX): HIV 1&2 Ab, 4th Generation: NONREACTIVE

## 2016-05-17 LAB — HEPATITIS C ANTIBODY: HCV Ab: NEGATIVE

## 2016-06-03 ENCOUNTER — Other Ambulatory Visit: Payer: Self-pay

## 2016-06-03 MED ORDER — HYDROCHLOROTHIAZIDE 25 MG PO TABS: ORAL_TABLET | ORAL | 1 refills | Status: DC

## 2016-06-07 DIAGNOSIS — G43709 Chronic migraine without aura, not intractable, without status migrainosus: Secondary | ICD-10-CM

## 2016-06-07 DIAGNOSIS — K219 Gastro-esophageal reflux disease without esophagitis: Secondary | ICD-10-CM

## 2016-06-07 HISTORY — DX: Chronic migraine without aura, not intractable, without status migrainosus: G43.709

## 2016-06-07 HISTORY — DX: Gastro-esophageal reflux disease without esophagitis: K21.9

## 2016-06-24 ENCOUNTER — Telehealth: Payer: Self-pay

## 2016-06-24 NOTE — Telephone Encounter (Signed)
Check database please. I can call it in.

## 2016-06-24 NOTE — Telephone Encounter (Signed)
Patient lost her script for Ambien - can she pick up another.

## 2016-06-26 NOTE — Telephone Encounter (Signed)
Left message for pt to call back  °

## 2016-06-26 NOTE — Telephone Encounter (Signed)
She has not picked this up the pharmacy per the [place].  Okay to call in. Deliah BostonMichael Nalu [place]ouble[place], MS, PA-C 8:32 AM, 06/26/2016

## 2016-06-29 NOTE — Telephone Encounter (Signed)
Can we print this Rx, sign and place in my box please?

## 2016-07-01 MED ORDER — ZOLPIDEM TARTRATE 10 MG PO TABS: 10.0000 mg | ORAL_TABLET | Freq: Every evening | ORAL | 0 refills | Status: DC | PRN

## 2016-07-01 NOTE — Telephone Encounter (Signed)
I just printed her script, placed it in the nurse's box.

## 2016-07-02 NOTE — Telephone Encounter (Signed)
Yes I faxed it.

## 2016-07-02 NOTE — Telephone Encounter (Signed)
Kelli Contreras, did you get the script and take care of this?

## 2016-07-06 ENCOUNTER — Telehealth: Payer: Self-pay

## 2016-07-06 NOTE — Telephone Encounter (Signed)
PA approved through 07/07/2019. Since Rx was ordered from [facility], and they were the ins that approved it, I assume they now if is approved.

## 2016-07-06 NOTE — Telephone Encounter (Signed)
PA form for zolpidem was faxed to us. I completed and faxed back to [facility]. Pending.

## 2016-08-05 ENCOUNTER — Other Ambulatory Visit: Payer: Self-pay | Admitting: Family Medicine

## 2016-08-28 ENCOUNTER — Encounter: Payer: Self-pay | Admitting: Internal Medicine

## 2016-10-29 ENCOUNTER — Other Ambulatory Visit: Payer: Self-pay | Admitting: Family Medicine

## 2016-11-11 ENCOUNTER — Telehealth: Payer: Self-pay

## 2016-11-11 MED ORDER — TOPIRAMATE 100 MG PO TABS: 100.0000 mg | ORAL_TABLET | Freq: Two times a day (BID) | ORAL | 0 refills | Status: DC

## 2016-11-11 NOTE — Telephone Encounter (Signed)
[facility] called to see if they could get RF of pt's topamax. Gave 1 mos only bc pt needs OV. Pharm agreed and they will put note with order that pt needs OV for more.

## 2016-11-15 ENCOUNTER — Other Ambulatory Visit: Payer: Self-pay | Admitting: Family Medicine

## 2016-11-16 NOTE — Telephone Encounter (Signed)
SS REFILL REQ FOR HYDRODIURIL, LEXAPRO Last OV 7/2-17,  Last CMP 05/2016

## 2016-11-18 NOTE — Telephone Encounter (Signed)
Call --- I sent a one month supply of medications (Lexapro, HCTZ) to mail order. Pt is due this month for six month follow-up with me; please schedule OV with me this month.

## 2016-12-03 ENCOUNTER — Telehealth: Payer: Self-pay

## 2016-12-03 ENCOUNTER — Other Ambulatory Visit: Payer: Self-pay | Admitting: Family Medicine

## 2016-12-03 NOTE — Telephone Encounter (Signed)
Appointment made for patient on  12/16/16 (Wed) 10:30 AM 30 min Ethelda ChickKristi M Smith, MD   For medication refills, patient was sent a MyChart message to let her know.

## 2016-12-03 NOTE — Telephone Encounter (Signed)
Please schedule an appt for patient with Dr Katrinka BlazingSmith

## 2016-12-07 ENCOUNTER — Other Ambulatory Visit: Payer: Self-pay | Admitting: Family Medicine

## 2016-12-16 ENCOUNTER — Other Ambulatory Visit: Payer: Self-pay | Admitting: Family Medicine

## 2016-12-16 ENCOUNTER — Ambulatory Visit: Payer: BC Managed Care – PPO | Admitting: Family Medicine

## 2016-12-16 NOTE — Telephone Encounter (Signed)
[place] for appointment today with Dr. Katrinka BlazingSmith. Left message on home phone that 1242-month supplies provided, but needs OV for additional fills.

## 2016-12-23 ENCOUNTER — Other Ambulatory Visit: Payer: Self-pay | Admitting: Physician Assistant

## 2017-01-01 ENCOUNTER — Ambulatory Visit (INDEPENDENT_AMBULATORY_CARE_PROVIDER_SITE_OTHER): Payer: BC Managed Care – PPO

## 2017-01-01 ENCOUNTER — Ambulatory Visit (INDEPENDENT_AMBULATORY_CARE_PROVIDER_SITE_OTHER): Payer: BC Managed Care – PPO | Admitting: Urgent Care

## 2017-01-01 VITALS — BP 112/82 | HR 89 | Temp 98.7°F | Resp 16 | Ht 62.0 in | Wt 162.4 lb

## 2017-01-01 DIAGNOSIS — R059 Cough, unspecified: Secondary | ICD-10-CM

## 2017-01-01 DIAGNOSIS — R0602 Shortness of breath: Secondary | ICD-10-CM | POA: Diagnosis not present

## 2017-01-01 DIAGNOSIS — R05 Cough: Secondary | ICD-10-CM

## 2017-01-01 DIAGNOSIS — R0789 Other chest pain: Secondary | ICD-10-CM

## 2017-01-01 DIAGNOSIS — R0981 Nasal congestion: Secondary | ICD-10-CM

## 2017-01-01 DIAGNOSIS — R0982 Postnasal drip: Secondary | ICD-10-CM

## 2017-01-01 MED ORDER — BENZONATATE 100 MG PO CAPS: 100.0000 mg | ORAL_CAPSULE | Freq: Three times a day (TID) | ORAL | 0 refills | Status: DC | PRN

## 2017-01-01 MED ORDER — HYDROCODONE-HOMATROPINE 5-1.5 MG/5ML PO SYRP: 5.0000 mL | ORAL_SOLUTION | Freq: Every evening | ORAL | 0 refills | Status: DC | PRN

## 2017-01-01 MED ORDER — PSEUDOEPHEDRINE HCL ER 120 MG PO TB12: 120.0000 mg | ORAL_TABLET | Freq: Two times a day (BID) | ORAL | 3 refills | Status: DC

## 2017-01-01 NOTE — Patient Instructions (Addendum)
Cough, Adult Coughing is a reflex that clears your throat and your airways. Coughing helps to heal and protect your lungs. It is normal to cough occasionally, but a cough that happens with other symptoms or lasts a long time may be a sign of a condition that needs treatment. A cough may last only 2-3 weeks (acute), or it may last longer than 8 weeks (chronic). What are the causes? Coughing is commonly caused by:  Breathing in substances that irritate your lungs.  A viral or bacterial respiratory infection.  Allergies.  Asthma.  Postnasal drip.  Smoking.  Acid backing up from the stomach into the esophagus (gastroesophageal reflux).  Certain medicines.  Chronic lung problems, including COPD (or rarely, lung cancer).  Other medical conditions such as heart failure. Follow these instructions at home: Pay attention to any changes in your symptoms. Take these actions to help with your discomfort:  Take medicines only as told by your health care provider.  If you were prescribed an antibiotic medicine, take it as told by your health care provider. Do not stop taking the antibiotic even if you start to feel better.  Talk with your health care provider before you take a cough suppressant medicine.  Drink enough fluid to keep your urine clear or pale yellow.  If the air is dry, use a cold steam vaporizer or humidifier in your bedroom or your home to help loosen secretions.  Avoid anything that causes you to cough at work or at home.  If your cough is worse at night, try sleeping in a semi-upright position.  Avoid cigarette smoke. If you smoke, quit smoking. If you need help quitting, ask your health care provider.  Avoid caffeine.  Avoid alcohol.  Rest as needed. Contact a health care provider if:  You have new symptoms.  You cough up pus.  Your cough does not get better after 2-3 weeks, or your cough gets worse.  You cannot control your cough with suppressant medicines  and you are losing sleep.  You develop pain that is getting worse or pain that is not controlled with pain medicines.  You have a fever.  You have unexplained weight loss.  You have night sweats. Get help right away if:  You cough up blood.  You have difficulty breathing.  Your heartbeat is very fast. This information is not intended to replace advice given to you by your health care provider. Make sure you discuss any questions you have with your health care provider. Document Released: 04/24/2011 Document Revised: 04/02/2016 Document Reviewed: 01/02/2015 Elsevier Interactive Patient Education  2017 Elsevier Inc.     IF you received an x-ray today, you will receive an invoice from [facility]. Please contact [facility] at [phone] with questions or concerns regarding your invoice.   IF you received labwork today, you will receive an invoice from [facility]. Please contact [facility] at [phone] with questions or concerns regarding your invoice.   Our billing staff will not be able to assist you with questions regarding bills from these companies.  You will be contacted with the lab results as soon as they are available. The fastest way to get your results is to activate your My Chart account. Instructions are located on the last page of this paperwork. If you have not heard from us regarding the results in 2 weeks, please contact this office.      

## 2017-01-01 NOTE — Progress Notes (Signed)
   MRN: [id number] DOB: September 02, 1951  Subjective:   Kelli Contreras is a 66 y.o. female presenting for chief complaint of Cough and stuffy nose  Reports 1 week history of productive cough, nasal and chest congestion, sore throat, post-nasal drainage, watery eyes, body aches, fatigue. Cough elicits chest pain, shob. Has tried APAP cold/flu with some relief. Denies fever, headaches, ear pain, ear drainage, wheezing, n/v, abdominal pain, rashes. Denies smoking cigarettes. Denies history of allergies or asthma.  Kelli Contreras has a current medication list which includes the following prescription(s): aspirin, eletriptan, escitalopram, hydrochlorothiazide, hydrocodone-acetaminophen, ibuprofen, imipramine, klor-con m20, lisinopril, metoprolol succinate, topiramate, and zolpidem. Also is allergic to other.  Kelli Contreras  has a past medical history of Cataract; Depression; Diarrhea; Hypertension; IBS (irritable bowel syndrome); Insomnia; Migraine; and Plantar fasciitis. Also  has a past surgical history that includes Vesicovaginal fistula closure w/ TAH (1985); Bunionectomy (2007); and Abdominal hysterectomy.  Objective:   Vitals: BP 112/82   Pulse 89   Temp 98.7 F (37.1 C) (Oral)   Resp 16   Ht 5\' 2"  (1.575 m)   Wt 162 lb 6.4 oz (73.7 kg)   SpO2 99%   BMI 29.70 kg/m   Physical Exam  Constitutional: She is oriented to person, place, and time. She appears well-developed and well-nourished.  HENT:  TM's intact bilaterally, no effusions or erythema. Nasal turbinates erythematous with thick clear mucus, nasal passages patent. No sinus tenderness. Oropharynx with post-nasal drainage, mucous membranes moist, dentition in good repair.  Eyes: Right eye exhibits no discharge. Left eye exhibits no discharge. No scleral icterus.  Neck: Normal range of motion. Neck supple.  Cardiovascular: Normal rate, regular rhythm and intact distal pulses.  Exam reveals no gallop and no friction rub.   No murmur  heard. Pulmonary/Chest: No respiratory distress. She has no wheezes. She has no rales.  Lymphadenopathy:    She has no cervical adenopathy.  Neurological: She is alert and oriented to person, place, and time.  Skin: Skin is warm and dry.   Dg Chest 2 View  Result Date: 01/01/2017 CLINICAL DATA:  Cough with chest pain and shortness of breath. EXAM: CHEST  2 VIEW COMPARISON:  01/01/2016 FINDINGS: The lungs are clear wiithout focal pneumonia, edema, pneumothorax or pleural effusion. The cardiopericardial silhouette is within normal limits for size. The visualized bony structures of the thorax are intact. IMPRESSION: No active cardiopulmonary disease. Electronically Signed   By: Kennith CenterEric  Mansell M.D.   On: 01/01/2017 14:34   Assessment and Plan :   1. Cough 2. Atypical chest pain 3. Shortness of breath 4. Nasal congestion 5. Post-nasal drainage - Likely viral in nature, advised supportive care. - If no improvement or symptoms do not resolve return to clinic on 01/04/2017.  Wallis BambergMario Arletha Marschke, PA-C [facility] [phone] 01/01/2017  2:00 PM

## 2017-01-15 ENCOUNTER — Other Ambulatory Visit: Payer: Self-pay | Admitting: Physician Assistant

## 2017-02-03 ENCOUNTER — Other Ambulatory Visit: Payer: Self-pay | Admitting: Family Medicine

## 2017-02-04 NOTE — Telephone Encounter (Signed)
Pt needs and appt within in a month for recheck

## 2017-02-11 ENCOUNTER — Ambulatory Visit (INDEPENDENT_AMBULATORY_CARE_PROVIDER_SITE_OTHER): Payer: BC Managed Care – PPO | Admitting: Family Medicine

## 2017-02-11 VITALS — BP 150/92 | HR 73 | Temp 98.4°F | Resp 18 | Ht 62.0 in | Wt 168.8 lb

## 2017-02-11 DIAGNOSIS — Z23 Encounter for immunization: Secondary | ICD-10-CM | POA: Diagnosis not present

## 2017-02-11 DIAGNOSIS — F5104 Psychophysiologic insomnia: Secondary | ICD-10-CM

## 2017-02-11 DIAGNOSIS — Z1231 Encounter for screening mammogram for malignant neoplasm of breast: Secondary | ICD-10-CM | POA: Diagnosis not present

## 2017-02-11 DIAGNOSIS — G43709 Chronic migraine without aura, not intractable, without status migrainosus: Secondary | ICD-10-CM | POA: Diagnosis not present

## 2017-02-11 DIAGNOSIS — I10 Essential (primary) hypertension: Secondary | ICD-10-CM

## 2017-02-11 MED ORDER — LISINOPRIL 20 MG PO TABS: 20.0000 mg | ORAL_TABLET | Freq: Every day | ORAL | 1 refills | Status: DC

## 2017-02-11 MED ORDER — ESCITALOPRAM OXALATE 10 MG PO TABS: 10.0000 mg | ORAL_TABLET | Freq: Every day | ORAL | 1 refills | Status: DC

## 2017-02-11 MED ORDER — HYDROCHLOROTHIAZIDE 25 MG PO TABS: 25.0000 mg | ORAL_TABLET | Freq: Every day | ORAL | 1 refills | Status: DC

## 2017-02-11 MED ORDER — TOPIRAMATE 100 MG PO TABS: ORAL_TABLET | ORAL | 1 refills | Status: DC

## 2017-02-11 MED ORDER — POTASSIUM CHLORIDE CRYS ER 20 MEQ PO TBCR: EXTENDED_RELEASE_TABLET | ORAL | 1 refills | Status: DC

## 2017-02-11 MED ORDER — IMIPRAMINE HCL 50 MG PO TABS: 100.0000 mg | ORAL_TABLET | Freq: Every day | ORAL | 1 refills | Status: DC

## 2017-02-11 MED ORDER — RIZATRIPTAN BENZOATE 10 MG PO TBDP: 10.0000 mg | ORAL_TABLET | ORAL | 1 refills | Status: DC | PRN

## 2017-02-11 MED ORDER — METOPROLOL SUCCINATE ER 50 MG PO TB24: 50.0000 mg | ORAL_TABLET | Freq: Every day | ORAL | 1 refills | Status: DC

## 2017-02-11 NOTE — Progress Notes (Signed)
Subjective:    Patient ID: Kelli Contreras, female    DOB: January 05, 1951, 66 y.o.   MRN: [id number]  02/11/2017  Medication Problem (all medications)   HPI This 66 y.o. female presents for nine month follow-up of hypertension, anxiety and depression, and migraines, and fatigue.  Patient reports good compliance with medication, good tolerance to medication, and good symptom control.   Not checking blood pressure at home; has a headache; ran out of Topamax; not sleeping because ran out of medication.  Taking care of nursing home mother;goes to see mother every day; very demanding.  Insurance is not covering Relpax.  Cannot sleep without Imipramine.  Needs refill; did not sleep well at all.   Not exercising.  Works with kindergardeners. Having 1-2 migraines per month; sometimes more.  Stress is a big trigger.  Insurance no longer going to cover Relpax. Chest pain has significantly decreased.  Refuses repeat colonoscopy.  Grandchild due in September.  Immunization History  Administered Date(s) Administered  . Tdap 02/11/2017   BP Readings from Last 3 Encounters:  02/11/17 (!) 150/92  01/01/17 112/82  05/16/16 100/70   Wt Readings from Last 3 Encounters:  02/11/17 168 lb 12.8 oz (76.6 kg)  01/01/17 162 lb 6.4 oz (73.7 kg)  05/16/16 164 lb 12.8 oz (74.8 kg)    Review of Systems  Constitutional: Negative for chills, diaphoresis, fatigue and fever.  HENT: Positive for congestion and rhinorrhea.   Eyes: Negative for visual disturbance.  Respiratory: Negative for cough and shortness of breath.   Cardiovascular: Negative for chest pain, palpitations and leg swelling.  Gastrointestinal: Negative for abdominal pain, constipation, diarrhea, nausea and vomiting.  Endocrine: Negative for cold intolerance, heat intolerance, polydipsia, polyphagia and polyuria.  Neurological: Positive for headaches. Negative for dizziness, tremors, seizures, syncope, facial asymmetry, speech difficulty,  weakness, light-headedness and numbness.  Psychiatric/Behavioral: Positive for sleep disturbance. Negative for dysphoric mood, self-injury and suicidal ideas. The patient is not nervous/anxious.     Past Medical History:  Diagnosis Date  . Cataract   . Depression   . Diarrhea   . Hypertension   . IBS (irritable bowel syndrome)    diarrhea  . Insomnia   . Migraine   . Plantar fasciitis    Past Surgical History:  Procedure Laterality Date  . ABDOMINAL HYSTERECTOMY     DUB; ovaries intact.  Arbutus Leas  2007   Dr. Lestine Box  . VESICOVAGINAL FISTULA CLOSURE W/ TAH  1985   Dr. Nicholas Lose   Allergies  Allergen Reactions  . Other Other (See Comments)    Onions & Chocolate cause migraine    Social History   Social History  . Marital status: Widowed    Spouse name: N/A  . Number of children: 2  . Years of education: N/A   Occupational History  . Not on file.   Social History Main Topics  . Smoking status: Never Smoker  . Smokeless tobacco: Never Used  . Alcohol use No  . Drug use: No  . Sexual activity: Not on file   Other Topics Concern  . Not on file   Social History Narrative   Marital status: widowed since 2008 after 36 years of marriage; not dating      Children: 2; 1 grandchildren      Lives: alone      Employment:  Geologist, engineering kindergarten x 22 years; [employer]      Tobacco: none      Alcohol: none   One  grandchild.   Data processing manager.      Exercise:  Walking once per week         Family History  Problem Relation Age of Onset  . CAD Father 46  . Cancer Father     bladder cancer  . Diabetes Father   . Heart disease Father   . Hyperlipidemia Father   . Hypertension Father   . Valvular heart disease Mother     MVP  . Heart disease Mother   . Hyperlipidemia Mother   . Hypertension Mother   . Hypertension Brother   . Hypertension      mother, father, brother       Objective:    BP (!) 150/92   Pulse 73   Temp 98.4 F (36.9  C) (Oral)   Resp 18   Ht  (1.575 m)   Wt 168 lb 12.8 oz (76.6 kg)   SpO2 100%   BMI 30.87 kg/m  Physical Exam  Constitutional: She is oriented to person, place, and time. She appears well-developed and well-nourished. No distress.  HENT:  Head: Normocephalic and atraumatic.  Right Ear: External ear normal.  Left Ear: External ear normal.  Nose: Nose normal.  Mouth/Throat: Oropharynx is clear and moist.  Eyes: Conjunctivae and EOM are normal. Pupils are equal, round, and reactive to light.  Neck: Normal range of motion. Neck supple. Carotid bruit is not present. No thyromegaly present.  Cardiovascular: Normal rate, regular rhythm, normal heart sounds and intact distal pulses.  Exam reveals no gallop and no friction rub.   No murmur heard. Pulmonary/Chest: Effort normal and breath sounds normal. She has no wheezes. She has no rales.  Abdominal: Soft. Bowel sounds are normal. She exhibits no distension and no mass. There is no tenderness. There is no rebound and no guarding.  Lymphadenopathy:    She has no cervical adenopathy.  Neurological: She is alert and oriented to person, place, and time. No cranial nerve deficit. She exhibits normal muscle tone. Coordination normal.  Skin: Skin is warm and dry. No rash noted. She is not diaphoretic. No erythema. No pallor.  Psychiatric: She has a normal mood and affect. Her behavior is normal. Judgment and thought content normal.   Depression screen [facility] 2/9 02/11/2017 01/01/2017 05/16/2016 01/01/2016 10/24/2015  Decreased Interest 0 0 0 0 0  Down, Depressed, Hopeless 0 0 0 0 0  PHQ - 2 Score 0 0 0 0 0   Fall Risk  02/11/2017 01/01/2017 05/16/2016 02/18/2015  Falls in the past year? No No No No        Assessment & Plan:   1. Essential hypertension   2. Chronic migraine without aura without status migrainosus, not intractable   3. Psychophysiological insomnia   4. Need for Tdap vaccination    -uncontrolled blood pressure due to non-compliance  with follow-up and thus medication; refills provided. -obtain labs related to chronic disease management -s/p TDAP -migraines are stable on current regimen   Orders Placed This Encounter  Procedures  . Tdap vaccine greater than or equal to 7yo IM  . CBC with Differential/Platelet  . Comprehensive metabolic panel    Order Specific Question:   Has the patient fasted?    Answer:   Yes  . TSH  . Lipid panel    Order Specific Question:   Has the patient fasted?    Answer:   Yes  . Care order/instruction:    Please recheck BP.  Marland Kitchen POCT urinalysis dipstick  Meds ordered this encounter  Medications  . rizatriptan (MAXALT-MLT) 10 MG disintegrating tablet    Sig: Take 1 tablet (10 mg total) by mouth as needed for migraine. May repeat in 2 hours if needed    Dispense:  24 tablet    Refill:  1  . escitalopram (LEXAPRO) 10 MG tablet    Sig: Take 1-1.5 tablets (10-15 mg total) by mouth at bedtime.    Dispense:  135 tablet    Refill:  1  . hydrochlorothiazide (HYDRODIURIL) 25 MG tablet    Sig: Take 1 tablet (25 mg total) by mouth daily.    Dispense:  90 tablet    Refill:  1  . imipramine (TOFRANIL) 50 MG tablet    Sig: Take 2 tablets (100 mg total) by mouth at bedtime.    Dispense:  180 tablet    Refill:  1  . potassium chloride SA (KLOR-CON M20) 20 MEQ tablet    Sig: TAKE 1 TABLET DAILY    Dispense:  90 tablet    Refill:  1  . lisinopril (PRINIVIL,ZESTRIL) 20 MG tablet    Sig: Take 1 tablet (20 mg total) by mouth daily.    Dispense:  90 tablet    Refill:  1  . topiramate (TOPAMAX) 100 MG tablet    Sig: TAKE 1 TABLET TWICE A DAY    Dispense:  180 tablet    Refill:  1    Return in about 4 months (around 06/13/2017) for complete physical examiniation.   Alazne Quant Paulita Fujita, M.D. Primary Care at [facility] previously [facility] [address] [place], [place]  [address] [phone] phone [phone] fax

## 2017-02-11 NOTE — Patient Instructions (Addendum)
   IF you received an x-ray today, you will receive an invoice from [facility]. Please contact [facility] at [phone] with questions or concerns regarding your invoice.   IF you received labwork today, you will receive an invoice from [facility]. Please contact [facility] at [phone] with questions or concerns regarding your invoice.   Our billing staff will not be able to assist you with questions regarding bills from these companies.  You will be contacted with the lab results as soon as they are available. The fastest way to get your results is to activate your My Chart account. Instructions are located on the last page of this paperwork. If you have not heard from us regarding the results in 2 weeks, please contact this office.      Fat and Cholesterol Restricted Diet Getting too much fat and cholesterol in your diet may cause health problems. Following this diet helps keep your fat and cholesterol at normal levels. This can keep you from getting sick. What types of fat should I choose?  Choose monosaturated and polyunsaturated fats. These are found in foods such as olive oil, canola oil, flaxseeds, walnuts, almonds, and seeds.  Eat more omega-3 fats. Good choices include salmon, mackerel, sardines, tuna, flaxseed oil, and ground flaxseeds.  Limit saturated fats. These are in animal products such as meats, butter, and cream. They can also be in plant products such as palm oil, palm kernel oil, and coconut oil.  Avoid foods with partially hydrogenated oils in them. These contain trans fats. Examples of foods that have trans fats are stick margarine, some tub margarines, cookies, crackers, and other baked goods. What general guidelines do I need to follow?  Check food labels. Look for the words "trans fat" and "saturated fat."  When preparing a meal:  Fill half of your plate with vegetables and green salads.  Fill one fourth of your plate with whole  grains. Look for the word "whole" as the first word in the ingredient list.  Fill one fourth of your plate with lean protein foods.  Eat more foods that have fiber, like apples, carrots, beans, peas, and barley.  Eat more home-cooked foods. Eat less at restaurants and buffets.  Limit or avoid alcohol.  Limit foods high in starch and sugar.  Limit fried foods.  Cook foods without frying them. Baking, boiling, grilling, and broiling are all great options.  Lose weight if you are overweight. Losing even a small amount of weight can help your overall health. It can also help prevent diseases such as diabetes and heart disease. What foods can I eat? Grains  Whole grains, such as whole wheat or whole grain breads, crackers, cereals, and pasta. Unsweetened oatmeal, bulgur, barley, quinoa, or brown rice. Corn or whole wheat flour tortillas. Vegetables  Fresh or frozen vegetables (raw, steamed, roasted, or grilled). Green salads. Fruits  All fresh, canned (in natural juice), or frozen fruits. Meat and Other Protein Products  Ground beef (85% or leaner), grass-fed beef, or beef trimmed of fat. Skinless chicken or turkey. Ground chicken or turkey. Pork trimmed of fat. All fish and seafood. Eggs. Dried beans, peas, or lentils. Unsalted nuts or seeds. Unsalted canned or dry beans. Dairy  Low-fat dairy products, such as skim or 1% milk, 2% or reduced-fat cheeses, low-fat ricotta or cottage cheese, or plain low-fat yogurt. Fats and Oils  Tub margarines without trans fats. Light or reduced-fat mayonnaise and salad dressings. Avocado. Olive, canola, sesame, or safflower oils. Natural peanut   or almond butter (choose ones without added sugar and oil). The items listed above may not be a complete list of recommended foods or beverages. Contact your dietitian for more options.  What foods are not recommended? Grains  White bread. White pasta. White rice. Cornbread. Bagels, pastries, and croissants.  Crackers that contain trans fat. Vegetables  White potatoes. Corn. Creamed or fried vegetables. Vegetables in a cheese sauce. Fruits  Dried fruits. Canned fruit in light or heavy syrup. Fruit juice. Meat and Other Protein Products  Fatty cuts of meat. Ribs, chicken wings, bacon, sausage, bologna, salami, chitterlings, fatback, hot dogs, bratwurst, and packaged luncheon meats. Liver and organ meats. Dairy  Whole or 2% milk, cream, half-and-half, and cream cheese. Whole milk cheeses. Whole-fat or sweetened yogurt. Full-fat cheeses. Nondairy creamers and whipped toppings. Processed cheese, cheese spreads, or cheese curds. Sweets and Desserts  Corn syrup, sugars, honey, and molasses. Candy. Jam and jelly. Syrup. Sweetened cereals. Cookies, pies, cakes, donuts, muffins, and ice cream. Fats and Oils  Butter, stick margarine, lard, shortening, ghee, or bacon fat. Coconut, palm kernel, or palm oils. Beverages  Alcohol. Sweetened drinks (such as sodas, lemonade, and fruit drinks or punches). The items listed above may not be a complete list of foods and beverages to avoid. Contact your dietitian for more information.  This information is not intended to replace advice given to you by your health care provider. Make sure you discuss any questions you have with your health care provider. Document Released: 04/26/2012 Document Revised: 07/02/2016 Document Reviewed: 01/25/2014 Elsevier Interactive Patient Education  2017 Elsevier Inc.  

## 2017-02-12 LAB — COMPREHENSIVE METABOLIC PANEL
ALBUMIN: 4 g/dL (ref 3.6–4.8)
ALT: 25 IU/L (ref 0–32)
AST: 28 IU/L (ref 0–40)
Albumin/Globulin Ratio: 1.3 (ref 1.2–2.2)
Alkaline Phosphatase: 100 IU/L (ref 39–117)
BILIRUBIN TOTAL: 0.2 mg/dL (ref 0.0–1.2)
BUN / CREAT RATIO: 20 (ref 12–28)
BUN: 17 mg/dL (ref 8–27)
CO2: 27 mmol/L (ref 18–29)
CREATININE: 0.85 mg/dL (ref 0.57–1.00)
Calcium: 9.4 mg/dL (ref 8.7–10.3)
Chloride: 101 mmol/L (ref 96–106)
GFR calc non Af Amer: 73 mL/min/{1.73_m2} (ref >59)
GFR, EST AFRICAN AMERICAN: 84 mL/min/{1.73_m2} (ref >59)
GLOBULIN, TOTAL: 3 g/dL (ref 1.5–4.5)
Glucose: 83 mg/dL (ref 65–99)
Potassium: 4 mmol/L (ref 3.5–5.2)
SODIUM: 141 mmol/L (ref 134–144)
TOTAL PROTEIN: 7 g/dL (ref 6.0–8.5)

## 2017-02-12 LAB — CBC WITH DIFFERENTIAL/PLATELET
Basophils Absolute: 0 10*3/uL (ref 0.0–0.2)
Basos: 0 %
EOS (ABSOLUTE): 0 10*3/uL (ref 0.0–0.4)
EOS: 0 %
HEMATOCRIT: 42.6 % (ref 34.0–46.6)
HEMOGLOBIN: 14.1 g/dL (ref 11.1–15.9)
IMMATURE GRANS (ABS): 0 10*3/uL (ref 0.0–0.1)
Immature Granulocytes: 0 %
LYMPHS ABS: 1.8 10*3/uL (ref 0.7–3.1)
LYMPHS: 31 %
MCH: 29.5 pg (ref 26.6–33.0)
MCHC: 33.1 g/dL (ref 31.5–35.7)
MCV: 89 fL (ref 79–97)
Monocytes Absolute: 0.6 10*3/uL (ref 0.1–0.9)
Monocytes: 10 %
NEUTROS ABS: 3.4 10*3/uL (ref 1.4–7.0)
Neutrophils: 59 %
Platelets: 173 10*3/uL (ref 150–379)
RBC: 4.78 x10E6/uL (ref 3.77–5.28)
RDW: 13.8 % (ref 12.3–15.4)
WBC: 5.8 10*3/uL (ref 3.4–10.8)

## 2017-02-12 LAB — LIPID PANEL
CHOL/HDL RATIO: 3.4 ratio (ref 0.0–4.4)
Cholesterol, Total: 192 mg/dL (ref 100–199)
HDL: 57 mg/dL (ref >39)
LDL Calculated: 116 mg/dL — ABNORMAL HIGH (ref 0–99)
Triglycerides: 95 mg/dL (ref 0–149)
VLDL CHOLESTEROL CAL: 19 mg/dL (ref 5–40)

## 2017-02-12 LAB — TSH: TSH: 1.34 u[IU]/mL (ref 0.450–4.500)

## 2017-02-17 ENCOUNTER — Telehealth: Payer: Self-pay | Admitting: Family Medicine

## 2017-02-17 NOTE — Telephone Encounter (Signed)
Pt called saying that she had a Tdap shot on 02/11/17 & that ever since that day she was having muscle pain. I advised her to take ibuprofen/tylenol like Dr.Smith advised. No swelling.   Please Advise

## 2017-03-01 ENCOUNTER — Other Ambulatory Visit: Payer: Self-pay | Admitting: Physician Assistant

## 2017-03-27 ENCOUNTER — Other Ambulatory Visit: Payer: Self-pay | Admitting: Physician Assistant

## 2017-05-16 ENCOUNTER — Other Ambulatory Visit: Payer: Self-pay | Admitting: Physician Assistant

## 2017-07-12 NOTE — Progress Notes (Signed)
Subjective:    Patient ID: Kelli Contreras, female    DOB: [birth year], 66 y.o.   MRN: [id number]  07/13/2017  Fatigue (x 1 month pt states she has no energy) and Leg Pain (x a few monthes )   HPI This 66 y.o. female presents for evaluation of fatigue and leg pain.  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 Too old for this stuff.  Must go home to take a nap before going to nursing home to see mother.  Onset for three months and before school was out last year.  Has progressively worsened.  This summer not up to par.  Bedtime 10:00pm; wakes up 10:00am.   Admits to insomnia at times; tries not to take Ambien.  Most nigths, too tired and falls asleep spontaneously.  Legs hurting and back hurts. Has orthotics and wears them.  Mild leg pain and back pain over the summer. Stomach will be just fine; then eat the same thing and has diarrhea.  Pure water. Diagnosed with IBS in the past.   Does not snore.  Not refreshed in mornings.  No sleep study.  Has been on Metoprolol for migraines for years. Taking Imipramine. Has taken Lexapro since husband passed away.  REFUSES FLU VACCINES.  BP Readings from Last 3 Encounters:  07/13/17 112/73  02/11/17 (!) 150/92  01/01/17 112/82   Wt Readings from Last 3 Encounters:  07/13/17 169 lb (76.7 kg)  02/11/17 168 lb 12.8 oz (76.6 kg)  01/01/17 162 lb 6.4 oz (73.7 kg)   Immunization History  Administered Date(s) Administered  . Tdap 02/11/2017    Review of Systems  Constitutional: Positive for fatigue. Negative for chills, diaphoresis and fever.  Eyes: Negative for visual disturbance.  Respiratory: Negative for cough and shortness of breath.   Cardiovascular: Negative for chest pain, palpitations and leg swelling.  Gastrointestinal: Positive for diarrhea. Negative for abdominal pain, constipation, nausea and vomiting.    Endocrine: Negative for cold intolerance, heat intolerance, polydipsia, polyphagia and polyuria.  Musculoskeletal: Positive for back pain and myalgias.  Neurological: Negative for dizziness, tremors, seizures, syncope, facial asymmetry, speech difficulty, weakness, light-headedness, numbness and headaches.  Psychiatric/Behavioral: Positive for sleep disturbance.    Past Medical History:  Diagnosis Date  . Cataract   . Depression   . Diarrhea   . Hypertension   . IBS (irritable bowel syndrome)    diarrhea  . Insomnia   . Migraine   . Plantar fasciitis    Past Surgical History:  Procedure Laterality Date  . ABDOMINAL HYSTERECTOMY     DUB; ovaries intact.  Arbutus Leas  2007   Dr. Lestine Box  . VESICOVAGINAL FISTULA CLOSURE W/ TAH  1985   Dr. Nicholas Lose   Allergies  Allergen Reactions  . Other Other (See Comments)    Onions & Chocolate cause migraine   Current Outpatient Prescriptions  Medication Sig Dispense Refill  . aspirin 81 MG tablet Take 81 mg by mouth at bedtime.     Marland Kitchen eletriptan (RELPAX) 20 MG tablet TAKE 1 TABLET AS NEEDED FOR MIGRAINE OR HEADACHE. MAY REPEAT IN 2 HOURS IF HEADACHE PERSISTS OR RECURS 24 tablet 0  . escitalopram (LEXAPRO) 10 MG tablet Take 1-1.5 tablets (10-15 mg total) by mouth at bedtime. 135 tablet 1  . hydrochlorothiazide (HYDRODIURIL) 25 MG tablet Take 1 tablet (25 mg total) by mouth daily.  30 tablet 5  . imipramine (TOFRANIL) 50 MG tablet Take 2 tablets (100 mg total) by mouth at bedtime. 180 tablet 1  . lisinopril (PRINIVIL,ZESTRIL) 20 MG tablet Take 1 tablet (20 mg total) by mouth daily. 90 tablet 1  . metoprolol succinate (TOPROL-XL) 50 MG 24 hr tablet Take 1 tablet (50 mg total) by mouth daily. Take with or immediately following a meal. 90 tablet 1  . potassium chloride SA (KLOR-CON M20) 20 MEQ tablet Take 1 tablet (20 mEq total) by mouth daily. 90 tablet 1  . rizatriptan (MAXALT-MLT) 10 MG disintegrating tablet Take 1 tablet (10 mg total) by  mouth as needed for migraine. May repeat in 2 hours if needed 24 tablet 1  . topiramate (TOPAMAX) 100 MG tablet TAKE 1 TABLET TWICE A DAY 180 tablet 1  . zolpidem (AMBIEN) 10 MG tablet Take 1 tablet (10 mg total) by mouth at bedtime as needed. 90 tablet 0  . DULoxetine (CYMBALTA) 30 MG capsule Take 1 capsule (30 mg total) by mouth daily. 90 capsule 1   No current facility-administered medications for this visit.    Social History   Social History  . Marital status: Widowed    Spouse name: N/A  . Number of children: 2  . Years of education: N/A   Occupational History  . Not on file.   Social History Main Topics  . Smoking status: Never Smoker  . Smokeless tobacco: Never Used  . Alcohol use No  . Drug use: No  . Sexual activity: Not on file   Other Topics Concern  . Not on file   Social History Narrative   Marital status: widowed since 2008 after 36 years of marriage; not dating      Children: 2; 1 grandchildren      Lives: alone      Employment:  Geologist, engineeringTeacher assistant kindergarten x 22 years; [employer]      Tobacco: none      Alcohol: none   One grandchild.   Data processing managerAssistant teacher.      Exercise:  Walking once per week         Family History  Problem Relation Age of Onset  . CAD Father 760  . Cancer Father        bladder cancer  . Diabetes Father   . Heart disease Father   . Hyperlipidemia Father   . Hypertension Father   . Valvular heart disease Mother        MVP  . Heart disease Mother   . Hyperlipidemia Mother   . Hypertension Mother   . Hypertension Brother   . Hypertension Unknown        mother, father, brother       Objective:    BP 112/73   Pulse 82   Temp 97.9 F (36.6 C) (Oral)   Resp 16   Ht 5' 1.42" (1.56 m)   Wt 169 lb (76.7 kg)   SpO2 98%   BMI 31.50 kg/m  Physical Exam  Constitutional: She is oriented to person, place, and time. She appears well-developed and well-nourished. No distress.  HENT:  Head: Normocephalic and  atraumatic.  Right Ear: External ear normal.  Left Ear: External ear normal.  Nose: Nose normal.  Mouth/Throat: Oropharynx is clear and moist.  Eyes: Pupils are equal, round, and reactive to light. Conjunctivae and EOM are normal.  Neck: Normal range of motion. Neck supple. Carotid bruit is not present. No thyromegaly present.  Cardiovascular: Normal rate,  regular rhythm, normal heart sounds and intact distal pulses.  Exam reveals no gallop and no friction rub.   No murmur heard. Pulmonary/Chest: Effort normal and breath sounds normal. She has no wheezes. She has no rales.  Abdominal: Soft. Bowel sounds are normal. She exhibits no distension and no mass. There is no tenderness. There is no rebound and no guarding.  Musculoskeletal:       Right hip: Normal.       Left hip: Normal.       Right knee: Normal.       Left knee: Normal.       Lumbar back: She exhibits pain. She exhibits normal range of motion, no tenderness, no bony tenderness and no spasm.  Lymphadenopathy:    She has no cervical adenopathy.  Neurological: She is alert and oriented to person, place, and time. No cranial nerve deficit.  Skin: Skin is warm and dry. No rash noted. She is not diaphoretic. No erythema. No pallor.  Psychiatric: She has a normal mood and affect. Her behavior is normal.    No results found. Depression screen [facility] 2/9 07/13/2017 02/11/2017 01/01/2017 05/16/2016 01/01/2016  Decreased Interest 0 0 0 0 0  Down, Depressed, Hopeless 0 0 0 0 0  PHQ - 2 Score 0 0 0 0 0   Fall Risk  07/13/2017 02/11/2017 01/01/2017 05/16/2016 02/18/2015  Falls in the past year? No No No No No        Assessment & Plan:   1. Other fatigue   2. Myalgia   3. Acute bilateral low back pain without sciatica   4. 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   -add Cymbalta 30mg  daily for  chronic pain/arthritis and depression/anxiety as Lexapro can make tired. -obtain labs to rule out secondary causes of fatigue.  Wean Lexapro. -recommend checking BP daily as borderline low today; if less than 110/60, will need to decrease medication.  Hypotension can cause fatigue.   Orders Placed This Encounter  Procedures  . MM DIGITAL SCREENING BILATERAL    Standing Status:   Future    Standing Expiration Date:   09/12/2018    Order Specific Question:   Reason for Exam (SYMPTOM  OR DIAGNOSIS REQUIRED)    Answer:   screening for breast cancer    Order Specific Question:   Preferred imaging location?    Answer:   [facility]  . DG Lumbar Spine Complete    Standing Status:   Future    Number of Occurrences:   1    Standing Expiration Date:   07/13/2018    Order Specific Question:   Reason for Exam (SYMPTOM  OR DIAGNOSIS REQUIRED)    Answer:   low back pain; leg pain    Order Specific Question:   Preferred imaging location?    Answer:   External  . CBC with Differential/Platelet  . CK  . Comprehensive metabolic panel  . TSH  . Vitamin B12  . VITAMIN D 25 Hydroxy (Vit-D Deficiency, Fractures)  . Sedimentation rate   Meds ordered this encounter  Medications  . DISCONTD: DULoxetine (CYMBALTA) 30 MG capsule    Sig: Take 1 capsule (30 mg total) by mouth daily.    Dispense:  30 capsule    Refill:  3  . DULoxetine (CYMBALTA) 30 MG capsule    Sig: Take  1 capsule (30 mg total) by mouth daily.    Dispense:  90 capsule    Refill:  1    Return in about 4 weeks (around 08/10/2017) for recheck.   Kristi Paulita Fujita, M.D. Primary Care at [facility] previously [facility] [address] [place], [place]  [address] [phone] phone [phone] fax

## 2017-07-13 ENCOUNTER — Ambulatory Visit (INDEPENDENT_AMBULATORY_CARE_PROVIDER_SITE_OTHER): Payer: BC Managed Care – PPO

## 2017-07-13 ENCOUNTER — Encounter: Payer: Self-pay | Admitting: Family Medicine

## 2017-07-13 ENCOUNTER — Ambulatory Visit (INDEPENDENT_AMBULATORY_CARE_PROVIDER_SITE_OTHER): Payer: BC Managed Care – PPO | Admitting: Family Medicine

## 2017-07-13 VITALS — BP 112/73 | HR 82 | Temp 97.9°F | Resp 16 | Ht 61.42 in | Wt 169.0 lb

## 2017-07-13 DIAGNOSIS — R5383 Other fatigue: Secondary | ICD-10-CM

## 2017-07-13 DIAGNOSIS — Z1239 Encounter for other screening for malignant neoplasm of breast: Secondary | ICD-10-CM

## 2017-07-13 DIAGNOSIS — M545 Low back pain, unspecified: Secondary | ICD-10-CM

## 2017-07-13 DIAGNOSIS — M791 Myalgia, unspecified site: Secondary | ICD-10-CM

## 2017-07-13 DIAGNOSIS — Z1231 Encounter for screening mammogram for malignant neoplasm of breast: Secondary | ICD-10-CM | POA: Diagnosis not present

## 2017-07-13 MED ORDER — DULOXETINE HCL 30 MG PO CPEP: 30.0000 mg | ORAL_CAPSULE | Freq: Every day | ORAL | 1 refills | Status: DC

## 2017-07-13 MED ORDER — DULOXETINE HCL 30 MG PO CPEP: 30.0000 mg | ORAL_CAPSULE | Freq: Every day | ORAL | 3 refills | Status: DC

## 2017-07-13 NOTE — Patient Instructions (Addendum)
Wean off of Lexapro --- 1/2 daily for two weeks and then 1/2 every other day for two weeks and then stop. START Cymbalta 30mg  one daily. Check blood pressure once daily.     IF you received an x-ray today, you will receive an [facility]. Please contact [facility] at [phone] with questions or concerns regarding your invoice.   IF you received labwork today, you will receive an [facility] from [facility]. Please contact [facility] at [phone] with questions or concerns regarding your invoice.   Our billing staff will not be able to assist you with questions regarding bills from these companies.  You will be contacted with the lab results as soon as they are available. The fastest way to get your results is to activate your My Chart account. Instructions are located on the last page of this paperwork. If you have not heard from us regarding the results in 2 weeks, please contact this office.

## 2017-07-14 LAB — CBC WITH DIFFERENTIAL/PLATELET
BASOS ABS: 0 10*3/uL (ref 0.0–0.2)
Basos: 0 %
EOS (ABSOLUTE): 0 10*3/uL (ref 0.0–0.4)
Eos: 0 %
Hematocrit: 41.7 % (ref 34.0–46.6)
Hemoglobin: 13.9 g/dL (ref 11.1–15.9)
IMMATURE GRANULOCYTES: 0 %
Immature Grans (Abs): 0 10*3/uL (ref 0.0–0.1)
LYMPHS ABS: 2.1 10*3/uL (ref 0.7–3.1)
Lymphs: 38 %
MCH: 29.8 pg (ref 26.6–33.0)
MCHC: 33.3 g/dL (ref 31.5–35.7)
MCV: 89 fL (ref 79–97)
MONOS ABS: 0.4 10*3/uL (ref 0.1–0.9)
Monocytes: 8 %
NEUTROS PCT: 54 %
Neutrophils Absolute: 3 10*3/uL (ref 1.4–7.0)
PLATELETS: 179 10*3/uL (ref 150–379)
RBC: 4.67 x10E6/uL (ref 3.77–5.28)
RDW: 14.3 % (ref 12.3–15.4)
WBC: 5.5 10*3/uL (ref 3.4–10.8)

## 2017-07-14 LAB — COMPREHENSIVE METABOLIC PANEL
ALBUMIN: 4.3 g/dL (ref 3.6–4.8)
ALK PHOS: 88 IU/L (ref 39–117)
ALT: 9 IU/L (ref 0–32)
AST: 18 IU/L (ref 0–40)
Albumin/Globulin Ratio: 1.5 (ref 1.2–2.2)
BUN / CREAT RATIO: 18 (ref 12–28)
BUN: 18 mg/dL (ref 8–27)
Bilirubin Total: 0.3 mg/dL (ref 0.0–1.2)
CHLORIDE: 103 mmol/L (ref 96–106)
CO2: 24 mmol/L (ref 20–29)
Calcium: 9.1 mg/dL (ref 8.7–10.3)
Creatinine, Ser: 0.98 mg/dL (ref 0.57–1.00)
GFR calc Af Amer: 71 mL/min/{1.73_m2} (ref >59)
GFR calc non Af Amer: 62 mL/min/{1.73_m2} (ref >59)
GLUCOSE: 99 mg/dL (ref 65–99)
Globulin, Total: 2.8 g/dL (ref 1.5–4.5)
Potassium: 3.2 mmol/L — ABNORMAL LOW (ref 3.5–5.2)
Sodium: 142 mmol/L (ref 134–144)
Total Protein: 7.1 g/dL (ref 6.0–8.5)

## 2017-07-14 LAB — TSH: TSH: 1.99 u[IU]/mL (ref 0.450–4.500)

## 2017-07-14 LAB — SEDIMENTATION RATE: SED RATE: 2 mm/h (ref 0–40)

## 2017-07-14 LAB — VITAMIN D 25 HYDROXY (VIT D DEFICIENCY, FRACTURES): Vit D, 25-Hydroxy: 21.1 ng/mL — ABNORMAL LOW (ref 30.0–100.0)

## 2017-07-14 LAB — VITAMIN B12: VITAMIN B 12: 291 pg/mL (ref 232–1245)

## 2017-07-14 LAB — CK: CK TOTAL: 108 U/L (ref 24–173)

## 2017-07-20 ENCOUNTER — Other Ambulatory Visit: Payer: Self-pay | Admitting: Family Medicine

## 2017-07-30 ENCOUNTER — Other Ambulatory Visit: Payer: Self-pay | Admitting: Family Medicine

## 2017-07-30 DIAGNOSIS — Z1231 Encounter for screening mammogram for malignant neoplasm of breast: Secondary | ICD-10-CM

## 2017-08-16 ENCOUNTER — Ambulatory Visit: Payer: BC Managed Care – PPO

## 2017-08-30 ENCOUNTER — Ambulatory Visit
Admission: RE | Admit: 2017-08-30 | Discharge: 2017-08-30 | Disposition: A | Discharge status: Home / Self Care | Payer: BC Managed Care – PPO | Source: Ambulatory Visit | Attending: Family Medicine | Admitting: Family Medicine

## 2017-08-30 DIAGNOSIS — Z1231 Encounter for screening mammogram for malignant neoplasm of breast: Secondary | ICD-10-CM

## 2017-09-27 ENCOUNTER — Other Ambulatory Visit: Payer: Self-pay | Admitting: Family Medicine

## 2017-10-16 ENCOUNTER — Other Ambulatory Visit: Payer: Self-pay | Admitting: Family Medicine

## 2017-10-30 ENCOUNTER — Other Ambulatory Visit: Payer: Self-pay | Admitting: Family Medicine

## 2017-11-01 NOTE — Telephone Encounter (Signed)
LOV 07/13/17 with Dr. Katrinka BlazingSmith / Refill request for Tofranil /

## 2017-11-08 ENCOUNTER — Other Ambulatory Visit: Payer: Self-pay | Admitting: Family Medicine

## 2017-12-01 ENCOUNTER — Other Ambulatory Visit: Payer: Self-pay | Admitting: Family Medicine

## 2017-12-11 ENCOUNTER — Encounter: Payer: Self-pay | Admitting: Family Medicine

## 2017-12-11 ENCOUNTER — Ambulatory Visit (INDEPENDENT_AMBULATORY_CARE_PROVIDER_SITE_OTHER): Payer: BC Managed Care – PPO

## 2017-12-11 ENCOUNTER — Ambulatory Visit: Payer: BC Managed Care – PPO | Admitting: Family Medicine

## 2017-12-11 VITALS — BP 130/90 | HR 65 | Temp 97.6°F | Resp 16 | Ht 61.0 in | Wt 171.4 lb

## 2017-12-11 DIAGNOSIS — K219 Gastro-esophageal reflux disease without esophagitis: Secondary | ICD-10-CM

## 2017-12-11 DIAGNOSIS — G43709 Chronic migraine without aura, not intractable, without status migrainosus: Secondary | ICD-10-CM

## 2017-12-11 DIAGNOSIS — I10 Essential (primary) hypertension: Secondary | ICD-10-CM

## 2017-12-11 DIAGNOSIS — R1114 Bilious vomiting: Secondary | ICD-10-CM

## 2017-12-11 DIAGNOSIS — R0609 Other forms of dyspnea: Secondary | ICD-10-CM | POA: Diagnosis not present

## 2017-12-11 DIAGNOSIS — F5104 Psychophysiologic insomnia: Secondary | ICD-10-CM | POA: Diagnosis not present

## 2017-12-11 DIAGNOSIS — M542 Cervicalgia: Secondary | ICD-10-CM

## 2017-12-11 LAB — POCT URINALYSIS DIP (MANUAL ENTRY)
Glucose, UA: NEGATIVE mg/dL
Ketones, POC UA: NEGATIVE mg/dL
Nitrite, UA: NEGATIVE
Protein Ur, POC: 30 mg/dL — AB
SPEC GRAV UA: 1.015 (ref 1.010–1.025)
Urobilinogen, UA: 1 E.U./dL
pH, UA: 5 (ref 5.0–8.0)

## 2017-12-11 MED ORDER — ZOLPIDEM TARTRATE 5 MG PO TABS: 5.0000 mg | ORAL_TABLET | Freq: Every evening | ORAL | 0 refills | Status: DC | PRN

## 2017-12-11 MED ORDER — HYDROCHLOROTHIAZIDE 25 MG PO TABS: 25.0000 mg | ORAL_TABLET | Freq: Every day | ORAL | 1 refills | Status: DC

## 2017-12-11 MED ORDER — DULOXETINE HCL 30 MG PO CPEP: 30.0000 mg | ORAL_CAPSULE | Freq: Every day | ORAL | 1 refills | Status: DC

## 2017-12-11 MED ORDER — METHOCARBAMOL 500 MG PO TABS: 500.0000 mg | ORAL_TABLET | Freq: Three times a day (TID) | ORAL | 0 refills | Status: DC | PRN

## 2017-12-11 MED ORDER — MELOXICAM 15 MG PO TABS: 15.0000 mg | ORAL_TABLET | Freq: Every day | ORAL | 0 refills | Status: DC

## 2017-12-11 MED ORDER — POTASSIUM CHLORIDE CRYS ER 20 MEQ PO TBCR: 20.0000 meq | EXTENDED_RELEASE_TABLET | Freq: Every day | ORAL | 1 refills | Status: DC

## 2017-12-11 MED ORDER — METOPROLOL SUCCINATE ER 50 MG PO TB24: ORAL_TABLET | ORAL | 1 refills | Status: DC

## 2017-12-11 MED ORDER — TOPIRAMATE 100 MG PO TABS: 100.0000 mg | ORAL_TABLET | Freq: Two times a day (BID) | ORAL | 1 refills | Status: DC

## 2017-12-11 MED ORDER — IMIPRAMINE HCL 50 MG PO TABS: ORAL_TABLET | ORAL | 1 refills | Status: DC

## 2017-12-11 MED ORDER — PANTOPRAZOLE SODIUM 40 MG PO TBEC: 40.0000 mg | DELAYED_RELEASE_TABLET | Freq: Every day | ORAL | 1 refills | Status: DC

## 2017-12-11 MED ORDER — LISINOPRIL 20 MG PO TABS: ORAL_TABLET | ORAL | 1 refills | Status: DC

## 2017-12-11 NOTE — Patient Instructions (Addendum)
   IF you received an x-ray today, you will receive an invoice from [facility]. Please contact [facility] at [phone] with questions or concerns regarding your invoice.   IF you received labwork today, you will receive an invoice from [facility]. Please contact [facility] at [phone] with questions or concerns regarding your invoice.   Our billing staff will not be able to assist you with questions regarding bills from these companies.  You will be contacted with the lab results as soon as they are available. The fastest way to get your results is to activate your My Chart account. Instructions are located on the last page of this paperwork. If you have not heard from us regarding the results in 2 weeks, please contact this office.      Cervical Strain and Sprain Rehab Ask your health care provider which exercises are safe for you. Do exercises exactly as told by your health care provider and adjust them as directed. It is normal to feel mild stretching, pulling, tightness, or discomfort as you do these exercises, but you should stop right away if you feel sudden pain or your pain gets worse.Do not begin these exercises until told by your health care provider. Stretching and range of motion exercises These exercises warm up your muscles and joints and improve the movement and flexibility of your neck. These exercises also help to relieve pain, numbness, and tingling. Exercise A: Cervical side bend  1. Using good posture, sit on a stable chair or stand up. 2. Without moving your shoulders, slowly tilt your left / right ear to your shoulder until you feel a stretch in your neck muscles. You should be looking straight ahead. 3. Hold for __________ seconds. 4. Repeat with the other side of your neck. Repeat __________ times. Complete this exercise __________ times a day. Exercise B: Cervical rotation  1. Using good posture, sit on a stable chair or stand  up. 2. Slowly turn your head to the side as if you are looking over your left / right shoulder. ? Keep your eyes level with the ground. ? Stop when you feel a stretch along the side and the back of your neck. 3. Hold for __________ seconds. 4. Repeat this by turning to your other side. Repeat __________ times. Complete this exercise __________ times a day. Exercise C: Thoracic extension and pectoral stretch 1. Roll a towel or a small blanket so it is about 4 inches (10 cm) in diameter. 2. Lie down on your back on a firm surface. 3. Put the towel lengthwise, under your spine in the middle of your back. It should not be not under your shoulder blades. The towel should line up with your spine from your middle back to your lower back. 4. Put your hands behind your head and let your elbows fall out to your sides. 5. Hold for __________ seconds. Repeat __________ times. Complete this exercise __________ times a day. Strengthening exercises These exercises build strength and endurance in your neck. Endurance is the ability to use your muscles for a long time, even after your muscles get tired. Exercise D: Upper cervical flexion, isometric 1. Lie on your back with a thin pillow behind your head and a small rolled-up towel under your neck. 2. Gently tuck your chin toward your chest and nod your head down to look toward your feet. Do not lift your head off the pillow. 3. Hold for __________ seconds. 4. Release the tension slowly. Relax your neck muscles   completely before you repeat this exercise. Repeat __________ times. Complete this exercise __________ times a day. Exercise E: Cervical extension, isometric  1. Stand about 6 inches (15 cm) away from a wall, with your back facing the wall. 2. Place a soft object, about 6-8 inches (15-20 cm) in diameter, between the back of your head and the wall. A soft object could be a small pillow, a ball, or a folded towel. 3. Gently tilt your head back and press  into the soft object. Keep your jaw and forehead relaxed. 4. Hold for __________ seconds. 5. Release the tension slowly. Relax your neck muscles completely before you repeat this exercise. Repeat __________ times. Complete this exercise __________ times a day. Posture and body mechanics  Body mechanics refers to the movements and positions of your body while you do your daily activities. Posture is part of body mechanics. Good posture and healthy body mechanics can help to relieve stress in your body's tissues and joints. Good posture means that your spine is in its natural S-curve position (your spine is neutral), your shoulders are pulled back slightly, and your head is not tipped forward. The following are general guidelines for applying improved posture and body mechanics to your everyday activities. Standing  When standing, keep your spine neutral and keep your feet about hip-width apart. Keep a slight bend in your knees. Your ears, shoulders, and hips should line up.  When you do a task in which you stand in one place for a long time, place one foot up on a stable object that is 2-4 inches (5-10 cm) high, such as a footstool. This helps keep your spine neutral. Sitting   When sitting, keep your spine neutral and your keep feet flat on the floor. Use a footrest, if necessary, and keep your thighs parallel to the floor. Avoid rounding your shoulders, and avoid tilting your head forward.  When working at a desk or a computer, keep your desk at a height where your hands are slightly lower than your elbows. Slide your chair under your desk so you are close enough to maintain good posture.  When working at a computer, place your monitor at a height where you are looking straight ahead and you do not have to tilt your head forward or downward to look at the screen. Resting When lying down and resting, avoid positions that are most painful for you. Try to support your neck in a neutral position.  You can use a contour pillow or a small rolled-up towel. Your pillow should support your neck but not push on it. This information is not intended to replace advice given to you by your health care provider. Make sure you discuss any questions you have with your health care provider. Document Released: 10/26/2005 Document Revised: 07/02/2016 Document Reviewed: 10/02/2015 Elsevier Interactive Patient Education  2018 [employer]vier Inc.  

## 2017-12-11 NOTE — Progress Notes (Signed)
Subjective:    Patient ID: Kelli Contreras, female    DOB: 06-Oct-1951, 67 y.o.   MRN: [id number]  12/11/2017  Headache (x 1 week, started in occipital lobe radiated all over. per pt feels different from migraines that she usually has); Fatigue (6 month follow up); and Medication Refill Remus Loffler(ambien, patient would like 3 month supply of prescriptions ordered today)    HPI This 67 y.o. female presents for acute visit for evaluation for headache and also would like to discuss chronic medical management of insomnia, hypertension.  Headache: onset one week ago.  terrible headache.  Onset sharp pain in L occipital region.  Radiates through head.Constant sharp pain in L occipital region.  No photophobia.  No nauseated.  No phonophobia. Not a migraine.  Tried migraine medications including Maxalt, Relpax, Ibuprofen, Advil, ASA, migraine OTC Excedrin.  Severity 9/10. No blurred vision,no diplopia. No n/t/w.  Neck pain; started on L neck region.  Pain with range of motion of neck. Some better today.  Severity today 4/10.  No medication today for pain.  No radiation into arms.  Nothing makes worse; bending over can make worse.  Has not left house since yesterday; had to get grandson from school.  Did keep granddaughter this week but is a good baby.  Terrible.  Has not checked BP this week.  Will not walk because of dyspnea and nausea.  Has vomited with exercise.  Sometimes will vomit; also will have diarrhea.  Vomits 1x/week.  No abdominal pain.  No weight loss.  Appetite is good. Husband's birthday is this month.   BP Readings from Last 3 Encounters:  12/11/17 130/90  07/13/17 112/73  02/11/17 (!) 150/92   Wt Readings from Last 3 Encounters:  12/11/17 171 lb 6.4 oz (77.7 kg)  07/13/17 169 lb (76.7 kg)  02/11/17 168 lb 12.8 oz (76.6 kg)   Immunization History  Administered Date(s) Administered  . Tdap 02/11/2017    Review of Systems  Constitutional: Positive for fatigue. Negative for chills,  diaphoresis and fever.  Eyes: Negative for photophobia and visual disturbance.  Respiratory: Negative for cough and shortness of breath.   Cardiovascular: Negative for chest pain, palpitations and leg swelling.  Gastrointestinal: Positive for nausea and vomiting. Negative for abdominal pain, constipation and diarrhea.  Endocrine: Negative for cold intolerance, heat intolerance, polydipsia, polyphagia and polyuria.  Musculoskeletal: Positive for neck pain and neck stiffness.  Neurological: Positive for headaches. Negative for dizziness, tremors, seizures, syncope, facial asymmetry, speech difficulty, weakness, light-headedness and numbness.  Psychiatric/Behavioral: Positive for sleep disturbance. Negative for dysphoric mood, self-injury and suicidal ideas. The patient is not nervous/anxious.     Past Medical History:  Diagnosis Date  . Cataract   . Depression   . Diarrhea   . Hypertension   . IBS (irritable bowel syndrome)    diarrhea  . Insomnia   . Migraine   . Plantar fasciitis    Past Surgical History:  Procedure Laterality Date  . ABDOMINAL HYSTERECTOMY     DUB; ovaries intact.  Marland Kitchen. BREAST CYST ASPIRATION Bilateral   . BUNIONECTOMY  2007   Dr. Lestine BoxBednarz  . VESICOVAGINAL FISTULA CLOSURE W/ TAH  1985   Dr. Nicholas LoseLomax   Allergies  Allergen Reactions  . Other Other (See Comments)    Onions & Chocolate cause migraine   Current Outpatient Medications on File Prior to Visit  Medication Sig Dispense Refill  . aspirin 81 MG tablet Take 81 mg by mouth at bedtime.     .Marland Kitchen  rizatriptan (MAXALT-MLT) 10 MG disintegrating tablet Take 1 tablet (10 mg total) by mouth as needed for migraine. May repeat in 2 hours if needed (Patient not taking: Reported on 12/11/2017) 24 tablet 1  . [DISCONTINUED] cholestyramine (QUESTRAN) 4 G packet Take 1 packet by mouth daily as needed. 90 each 3  . [DISCONTINUED] fluticasone (FLONASE) 50 MCG/ACT nasal spray Place 2 sprays into both nostrils daily. (Patient not  taking: Reported on 10/24/2015) 16 g 6   No current facility-administered medications on file prior to visit.    Social History   Socioeconomic History  . Marital status: Widowed    Spouse name: Not on file  . Number of children: 2  . Years of education: Not on file  . Highest education level: Not on file  Social Needs  . Financial resource strain: Not on file  . Food insecurity - worry: Not on file  . Food insecurity - inability: Not on file  . Transportation needs - medical: Not on file  . Transportation needs - non-medical: Not on file  Occupational History  . Not on file  Tobacco Use  . Smoking status: Never Smoker  . Smokeless tobacco: Never Used  Substance and Sexual Activity  . Alcohol use: No  . Drug use: No  . Sexual activity: Not on file  Other Topics Concern  . 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   Data processing manager.      Exercise:  Walking once per week      Family History  Problem Relation Age of Onset  . CAD Father 4  . Cancer Father        bladder cancer  . Diabetes Father   . Heart disease Father   . Hyperlipidemia Father   . Hypertension Father   . Valvular heart disease Mother        MVP  . Heart disease Mother   . Hyperlipidemia Mother   . Hypertension Mother   . Hypertension Brother   . Hypertension Unknown        mother, father, brother  . Breast cancer Maternal Aunt        Objective:    BP 130/90   Pulse 65   Temp 97.6 F (36.4 C) (Oral)   Resp 16   Ht 5\' 1"  (1.549 m)   Wt 171 lb 6.4 oz (77.7 kg)   SpO2 99%   BMI 32.39 kg/m  Physical Exam  Constitutional: She is oriented to person, place, and time. She appears well-developed and well-nourished. No distress.    HENT:  Head: Normocephalic and atraumatic.  Right Ear: External ear normal.  Left Ear: External ear normal.  Nose: Nose normal.  Mouth/Throat: Oropharynx is clear and moist.  Eyes: Conjunctivae and EOM are normal. Pupils are equal, round, and reactive to light.  Neck: Normal range of motion. Neck supple. Carotid bruit is not present. No thyromegaly present.  Cardiovascular: Normal rate, regular rhythm, normal heart sounds and intact distal pulses. Exam reveals no gallop and no friction rub.  No murmur heard. Pulmonary/Chest: Effort normal and breath sounds normal. She has no wheezes. She has no  rales.  Abdominal: Soft. Bowel sounds are normal. She exhibits no distension and no mass. There is no tenderness. There is no rebound and no guarding.  Musculoskeletal:       Cervical back: She exhibits decreased range of motion, tenderness, bony tenderness, pain and spasm. She exhibits no swelling, no edema, no deformity, no laceration and normal pulse.  +TTP occipital ridge; painful ROM all directions.   Lymphadenopathy:    She has no cervical adenopathy.  Neurological: She is alert and oriented to person, place, and time. She has normal strength. No cranial nerve deficit. She displays a negative Romberg sign. Coordination and gait normal.  Skin: Skin is warm and dry. No rash noted. She is not diaphoretic. No erythema. No pallor.  Psychiatric: She has a normal mood and affect. Her behavior is normal.   Dg Cervical Spine Complete  Result Date: 12/11/2017 CLINICAL DATA:  L sided neck pain for one week; no radicular symptoms EXAM: CERVICAL SPINE - COMPLETE 4+ VIEW COMPARISON:  None. FINDINGS: Normal alignment. No prevertebral soft tissue swelling. Negative for fracture. Moderate narrowing of interspaces C3-T2 with small endplate spurs. No definite left-sided osseous foraminal encroachment. The right foramina are not well profiled. Multiple missing teeth and dental restorations. IMPRESSION: 1. Negative  for fracture or other acute bone abnormality. 2. Multilevel cervical spondylitic changes C3-T2. Electronically Signed   By: Corlis Leak M.D.   On: 12/11/2017 11:47   Depression screen [facility] 2/9 07/13/2017 02/11/2017 01/01/2017 05/16/2016 01/01/2016  Decreased Interest 0 0 0 0 0  Down, Depressed, Hopeless 0 0 0 0 0  PHQ - 2 Score 0 0 0 0 0   Fall Risk  07/13/2017 02/11/2017 01/01/2017 05/16/2016 02/18/2015  Falls in the past year? No No No No No        Assessment & Plan:   1. Essential hypertension   2. Gastroesophageal reflux disease without esophagitis   3. Chronic migraine without aura without status migrainosus, not intractable   4. Psychophysiological insomnia   5. Dyspnea on exertion   6. Bilious vomiting with nausea   7. Neck pain     Hypertension well controlled.  Obtain labs.  Refills provided.  No changes in management.  Suffering with intermittent nausea with vomiting and some diarrhea with exertion.  Status post extensive cardiac evaluation by cardiology in 2013 with negative workup.  Symptoms suggestive of GERD intermittently.  Treat with Protonix 40 mg daily for the next 2 months.  If no improvement, refer to cardiology  Obtain amylase and lipase to rule out secondary pathology. History of IBS diarrhea.   Chronic dyspnea on exertion interfering with exercise.  Status post extensive cardiology evaluation in 2013  for same symptoms with negative workup.  Refer to pulmonology.  Obtain EKG.   Acute onset non-migrainous headache.  Consistent with neck strain.  Status post cervical spine films revealed degenerative changes throughout.  Treat with Mobic and Robaxin; home exercise program provided.  If no improvement In 6 weeks, refer to orthopedics.  Migraines well controlled on current regimen.  Refills provided Topamax and Imipramine and Metoprolol and Maxalt.  -prolonged face-to-face for 40 minutes with greater than 50% of time dedicated to counseling and coordination of care.  Orders  Placed This Encounter  Procedures  . DG Cervical Spine Complete    Standing Status:   Future    Number of Occurrences:   1    Standing Expiration Date:   12/11/2018    Order Specific Question:   Reason  for Exam (SYMPTOM  OR DIAGNOSIS REQUIRED)    Answer:   L sided neck pain for one week; no radicular symptoms    Order Specific Question:   Preferred imaging location?    Answer:   External  . CBC with Differential/Platelet  . Comprehensive metabolic panel  . Amylase  . Lipase  . Ambulatory referral to Pulmonology    Referral Priority:   Routine    Referral Type:   Consultation    Referral Reason:   Specialty Services Required    Requested Specialty:   Pulmonary Disease    Number of Visits Requested:   1  . POCT urinalysis dipstick  . EKG 12-Lead   Meds ordered this encounter  Medications  . pantoprazole (PROTONIX) 40 MG tablet    Sig: Take 1 tablet (40 mg total) by mouth daily.    Dispense:  90 tablet    Refill:  1  . methocarbamol (ROBAXIN) 500 MG tablet    Sig: Take 1 tablet (500 mg total) by mouth every 8 (eight) hours as needed for muscle spasms.    Dispense:  60 tablet    Refill:  0  . meloxicam (MOBIC) 15 MG tablet    Sig: Take 1 tablet (15 mg total) by mouth daily.    Dispense:  30 tablet    Refill:  0  . DULoxetine (CYMBALTA) 30 MG capsule    Sig: Take 1 capsule (30 mg total) by mouth daily.    Dispense:  90 capsule    Refill:  1  . hydrochlorothiazide (HYDRODIURIL) 25 MG tablet    Sig: Take 1 tablet (25 mg total) by mouth daily.    Dispense:  90 tablet    Refill:  1  . imipramine (TOFRANIL) 50 MG tablet    Sig: TAKE 2 TABLETS (100 MG     TOTAL) AT BEDTIME    Dispense:  180 tablet    Refill:  1  . lisinopril (PRINIVIL,ZESTRIL) 20 MG tablet    Sig: TAKE 1 TABLET DAILY    Dispense:  90 tablet    Refill:  1  . metoprolol succinate (TOPROL-XL) 50 MG 24 hr tablet    Sig: TAKE 1 TABLET DAILY WITH ORIMMEDIATELY FOLLOWING A  MEAL.    Dispense:  90 tablet    Refill:   1  . potassium chloride SA (KLOR-CON M20) 20 MEQ tablet    Sig: Take 1 tablet (20 mEq total) by mouth daily.    Dispense:  90 tablet    Refill:  1  . topiramate (TOPAMAX) 100 MG tablet    Sig: Take 1 tablet (100 mg total) by mouth 2 (two) times daily.    Dispense:  180 tablet    Refill:  1  . zolpidem (AMBIEN) 5 MG tablet    Sig: Take 1 tablet (5 mg total) by mouth at bedtime as needed.    Dispense:  60 tablet    Refill:  0    Return in about 2 months (around 02/08/2018) for recheck vomiting, diarrhea, nausea.   Kadeshia Kasparian Paulita Fujita, M.D. Primary Care at [facility] previously [facility] [address] [place], [place]  [address] [phone] phone [phone] fax

## 2017-12-12 LAB — CBC WITH DIFFERENTIAL/PLATELET
Basophils Absolute: 0 10*3/uL (ref 0.0–0.2)
Basos: 0 %
EOS (ABSOLUTE): 0 10*3/uL (ref 0.0–0.4)
Eos: 0 %
HEMOGLOBIN: 14.4 g/dL (ref 11.1–15.9)
Hematocrit: 42 % (ref 34.0–46.6)
IMMATURE GRANS (ABS): 0 10*3/uL (ref 0.0–0.1)
IMMATURE GRANULOCYTES: 0 %
LYMPHS: 31 %
Lymphocytes Absolute: 1.5 10*3/uL (ref 0.7–3.1)
MCH: 30.3 pg (ref 26.6–33.0)
MCHC: 34.3 g/dL (ref 31.5–35.7)
MCV: 88 fL (ref 79–97)
MONOCYTES: 10 %
Monocytes Absolute: 0.5 10*3/uL (ref 0.1–0.9)
NEUTROS ABS: 2.7 10*3/uL (ref 1.4–7.0)
NEUTROS PCT: 59 %
PLATELETS: 179 10*3/uL (ref 150–379)
RBC: 4.76 x10E6/uL (ref 3.77–5.28)
RDW: 13.6 % (ref 12.3–15.4)
WBC: 4.6 10*3/uL (ref 3.4–10.8)

## 2017-12-12 LAB — COMPREHENSIVE METABOLIC PANEL
ALBUMIN: 4.2 g/dL (ref 3.6–4.8)
ALT: 11 IU/L (ref 0–32)
AST: 17 IU/L (ref 0–40)
Albumin/Globulin Ratio: 1.5 (ref 1.2–2.2)
Alkaline Phosphatase: 95 IU/L (ref 39–117)
BUN / CREAT RATIO: 9 — AB (ref 12–28)
BUN: 10 mg/dL (ref 8–27)
Bilirubin Total: 0.4 mg/dL (ref 0.0–1.2)
CALCIUM: 9 mg/dL (ref 8.7–10.3)
CO2: 22 mmol/L (ref 20–29)
CREATININE: 1.07 mg/dL — AB (ref 0.57–1.00)
Chloride: 107 mmol/L — ABNORMAL HIGH (ref 96–106)
GFR calc non Af Amer: 55 mL/min/{1.73_m2} — ABNORMAL LOW (ref >59)
GFR, EST AFRICAN AMERICAN: 63 mL/min/{1.73_m2} (ref >59)
GLUCOSE: 85 mg/dL (ref 65–99)
Globulin, Total: 2.8 g/dL (ref 1.5–4.5)
Potassium: 4 mmol/L (ref 3.5–5.2)
Sodium: 142 mmol/L (ref 134–144)
TOTAL PROTEIN: 7 g/dL (ref 6.0–8.5)

## 2017-12-12 LAB — AMYLASE: Amylase: 64 U/L (ref 31–124)

## 2017-12-12 LAB — LIPASE: LIPASE: 27 U/L (ref 14–72)

## 2018-01-07 ENCOUNTER — Other Ambulatory Visit: Payer: Self-pay | Admitting: Family Medicine

## 2018-01-14 ENCOUNTER — Ambulatory Visit: Payer: BC Managed Care – PPO | Admitting: Emergency Medicine

## 2018-01-14 ENCOUNTER — Ambulatory Visit (INDEPENDENT_AMBULATORY_CARE_PROVIDER_SITE_OTHER)
Admission: RE | Admit: 2018-01-14 | Discharge: 2018-01-14 | Disposition: A | Discharge status: Home / Self Care | Payer: BC Managed Care – PPO | Source: Ambulatory Visit | Attending: Emergency Medicine | Admitting: Emergency Medicine

## 2018-01-14 ENCOUNTER — Encounter: Payer: Self-pay | Admitting: Emergency Medicine

## 2018-01-14 VITALS — BP 124/84 | HR 79 | Ht 63.0 in | Wt 171.0 lb

## 2018-01-14 DIAGNOSIS — R0789 Other chest pain: Secondary | ICD-10-CM | POA: Diagnosis not present

## 2018-01-14 DIAGNOSIS — R0609 Other forms of dyspnea: Secondary | ICD-10-CM | POA: Insufficient documentation

## 2018-01-14 NOTE — Progress Notes (Signed)
Subjective:    Patient ID: Kelli Contreras, female    DOB: June 04, 1951, 67 y.o.   MRN: [id number]  HPI 67 year old never smoker with a history of migraine headaches, irritable bowel syndrome, depression, hypertension, GERD.  She is referred today by Dr. Katrinka BlazingSmith for evaluation of dyspnea.  She notes that she has dyspnea with walking, even on flat ground, to the point at which she has to stop to rest, has even had emesis. Has been a problem for about 6 months. No wheeze. No overt pain in legs. She does get some mid chest pain with exertion. Her wt has been stable. She has had some cough but only over the last 2 weeks. She has longstanding Raynaud's phenomenon in her fingers. She had a reassuring cardiac eval 2013. CXR 12/2016 was normal.     CBC Latest Ref Rng & Units 12/11/2017 07/13/2017 02/11/2017  WBC 3.4 - 10.8 x10E3/uL 4.6 5.5 5.8  Hemoglobin 11.1 - 15.9 g/dL 40.914.4 81.113.9 91.414.1  Hematocrit 34.0 - 46.6 % 42.0 41.7 42.6  Platelets 150 - 379 x10E3/uL 179 179 173     Review of Systems  Constitutional: Positive for appetite change. Negative for fever and unexpected weight change.  HENT: Negative for congestion, dental problem, ear pain, nosebleeds, postnasal drip, rhinorrhea, sinus pressure, sneezing, sore throat and trouble swallowing.   Eyes: Negative for redness and itching.  Respiratory: Positive for shortness of breath. Negative for cough, chest tightness and wheezing.   Cardiovascular: Positive for chest pain. Negative for palpitations and leg swelling.  Gastrointestinal: Negative for nausea and vomiting.  Genitourinary: Negative for dysuria.  Musculoskeletal: Negative for joint swelling.  Skin: Negative for rash.  Neurological: Positive for headaches.  Hematological: Does not bruise/bleed easily.  Psychiatric/Behavioral: Positive for dysphoric mood. The patient is not nervous/anxious.     Past Medical History:  Diagnosis Date  . Cataract   . Depression   . Diarrhea   . Hypertension     . IBS (irritable bowel syndrome)    diarrhea  . Insomnia   . Migraine   . Plantar fasciitis      Family History  Problem Relation Age of Onset  . CAD Father 5860  . Cancer Father        bladder cancer  . Diabetes Father   . Heart disease Father   . Hyperlipidemia Father   . Hypertension Father   . Valvular heart disease Mother        MVP  . Heart disease Mother   . Hyperlipidemia Mother   . Hypertension Mother   . Hypertension Brother   . Hypertension Unknown        mother, father, brother  . Breast cancer Maternal Aunt      Social History   Socioeconomic History  . Marital status: Widowed    Spouse name: Not on file  . Number of children: 2  . Years of education: Not on file  . Highest education level: Not on file  Social Needs  . Financial resource strain: Not on file  . Food insecurity - worry: Not on file  . Food insecurity - inability: Not on file  . Transportation needs - medical: Not on file  . Transportation needs - non-medical: Not on file  Occupational History  . Not on file  Tobacco Use  . Smoking status: Never Smoker  . Smokeless tobacco: Never Used  Substance and Sexual Activity  . Alcohol use: No  . Drug use: No  . Sexual  activity: Not on file  Other Topics Concern  . 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   Data processing manager.      Exercise:  Walking once per week        Allergies  Allergen Reactions  . Other Other (See Comments)    Onions & Chocolate cause migraine     Outpatient Medications Prior to Visit  Medication Sig Dispense Refill  . aspirin 81 MG tablet Take 81 mg by mouth at bedtime.     . DULoxetine (CYMBALTA) 30 MG capsule Take 1 capsule (30 mg total) by  mouth daily. 90 capsule 1  . hydrochlorothiazide (HYDRODIURIL) 25 MG tablet Take 1 tablet (25 mg total) by mouth daily. 90 tablet 1  . imipramine (TOFRANIL) 50 MG tablet TAKE 2 TABLETS (100 MG     TOTAL) AT BEDTIME 180 tablet 1  . lisinopril (PRINIVIL,ZESTRIL) 20 MG tablet TAKE 1 TABLET DAILY 90 tablet 1  . meloxicam (MOBIC) 15 MG tablet TAKE 1 TABLET BY MOUTH EVERY DAY 30 tablet 0  . methocarbamol (ROBAXIN) 500 MG tablet Take 1 tablet (500 mg total) by mouth every 8 (eight) hours as needed for muscle spasms. 60 tablet 0  . metoprolol succinate (TOPROL-XL) 50 MG 24 hr tablet TAKE 1 TABLET DAILY WITH ORIMMEDIATELY FOLLOWING A  MEAL. 90 tablet 1  . pantoprazole (PROTONIX) 40 MG tablet Take 1 tablet (40 mg total) by mouth daily. 90 tablet 1  . potassium chloride SA (KLOR-CON M20) 20 MEQ tablet Take 1 tablet (20 mEq total) by mouth daily. 90 tablet 1  . rizatriptan (MAXALT-MLT) 10 MG disintegrating tablet Take 1 tablet (10 mg total) by mouth as needed for migraine. May repeat in 2 hours if needed 24 tablet 1  . topiramate (TOPAMAX) 100 MG tablet Take 1 tablet (100 mg total) by mouth 2 (two) times daily. 180 tablet 1  . zolpidem (AMBIEN) 5 MG tablet Take 1 tablet (5 mg total) by mouth at bedtime as needed. 60 tablet 0   No facility-administered medications prior to visit.         Objective:   Physical Exam Vitals:   01/14/18 1620 01/14/18 1622  BP:  124/84  Pulse:  79  SpO2:  100%  Weight: 171 lb (77.6 kg)   Height: 5\' 3"  (1.6 m)    Gen: Pleasant, overweight woman, in no distress,  normal affect  ENT: No lesions,  mouth clear,  oropharynx clear, no postnasal drip  Neck: No JVD, no stridor  Lungs: No use of accessory muscles, bilaterally, no wheezing  Cardiovascular: RRR, heart sounds normal, no murmur or gallops, no peripheral edema  Musculoskeletal: No deformities, no cyanosis or clubbing  Neuro: alert, non focal  Skin: Warm, no lesions or rash    Assessment & Plan:  Chest  pain Principally an evaluation for dyspnea but she does describe some central substernal chest pain with exertion, sometimes associated with nausea and emesis.  She had a reassuring stress test in 2013, EKG from 12/2017 does not show any concerning findings.  I would like for her to talk to Dr. Antoine Poche to discuss any benefit  of a repeat stress test.  Dyspnea on exertion Exertional shortness of breath for approximately 6 months duration.  Interestingly associated with some chest discomfort, nausea and emesis.  No claudication no wheezing, no coughing.  She has Raynaud's phenomenon in her fingers, significance of this unclear.  Could be associated with autoimmune disease or pulmonary hypertension. -Full pulmonary function testing to evaluate for underlying lung process. -Echocardiogram to assess LV function but more importantly to evaluate for possible pulmonary hypertension given her Raynaud's phenomenon. -Chest x-ray to screen for interstitial lung disease.  If any suggestion then we will perform a CT scan of her chest. -Cardiac reevaluation since she does state that she has some central chest pain with exertion. -If both the heart and lung evaluations are reassuring then this is likely due to deconditioning.  At that point we could either start her on a supervised exercise regimen or we could consider cardia pulmonary exercise testing.  Levy Pupa, MD, PhD 01/14/2018, 4:58 PM [facility] [phone] or if no answer [phone]

## 2018-01-14 NOTE — Assessment & Plan Note (Signed)
Exertional shortness of breath for approximately 6 months duration.  Interestingly associated with some chest discomfort, nausea and emesis.  No claudication no wheezing, no coughing.  She has Raynaud's phenomenon in her fingers, significance of this unclear.  Could be associated with autoimmune disease or pulmonary hypertension. -Full pulmonary function testing to evaluate for underlying lung process. -Echocardiogram to assess LV function but more importantly to evaluate for possible pulmonary hypertension given her Raynaud's phenomenon. -Chest x-ray to screen for interstitial lung disease.  If any suggestion then we will perform a CT scan of her chest. -Cardiac reevaluation since she does state that she has some central chest pain with exertion. -If both the heart and lung evaluations are reassuring then this is likely due to deconditioning.  At that point we could either start her on a supervised exercise regimen or we could consider cardia pulmonary exercise testing.

## 2018-01-14 NOTE — Assessment & Plan Note (Signed)
Principally an evaluation for dyspnea but she does describe some central substernal chest pain with exertion, sometimes associated with nausea and emesis.  She had a reassuring stress test in 2013, EKG from 12/2017 does not show any concerning findings.  I would like for her to talk to Dr. Antoine PocheHochrein to discuss any benefit of a repeat stress test.

## 2018-01-14 NOTE — Patient Instructions (Addendum)
We will arrange for pulmonary function testing  We will order an echocardiogram We will refer you back to Dr Antoine PocheHochrein to discuss any benefit from repeating your cardiac stress testing.  We will repeat your CXR today.   Walking oximetry today on room air Follow with Dr Delton CoombesByrum in 1 month with full PFT.

## 2018-01-20 ENCOUNTER — Other Ambulatory Visit: Payer: Self-pay

## 2018-01-20 ENCOUNTER — Ambulatory Visit (HOSPITAL_COMMUNITY): Payer: BC Managed Care – PPO | Attending: Cardiovascular Disease

## 2018-01-20 DIAGNOSIS — R0609 Other forms of dyspnea: Principal | ICD-10-CM

## 2018-01-26 ENCOUNTER — Other Ambulatory Visit: Payer: Self-pay | Admitting: Family Medicine

## 2018-02-07 ENCOUNTER — Other Ambulatory Visit: Payer: Self-pay | Admitting: Family Medicine

## 2018-03-09 ENCOUNTER — Other Ambulatory Visit: Payer: Self-pay | Admitting: Family Medicine

## 2018-03-09 ENCOUNTER — Ambulatory Visit (INDEPENDENT_AMBULATORY_CARE_PROVIDER_SITE_OTHER): Payer: BC Managed Care – PPO | Admitting: Emergency Medicine

## 2018-03-09 ENCOUNTER — Ambulatory Visit: Payer: BC Managed Care – PPO | Admitting: Emergency Medicine

## 2018-03-09 ENCOUNTER — Encounter: Payer: Self-pay | Admitting: Emergency Medicine

## 2018-03-09 DIAGNOSIS — R0609 Other forms of dyspnea: Secondary | ICD-10-CM

## 2018-03-09 LAB — PULMONARY FUNCTION TEST
DL/VA % pred: 77 %
DL/VA: 3.54 ml/min/mmHg/L
DLCO unc % pred: 76 %
DLCO unc: 16.98 ml/min/mmHg
FEF 25-75 Post: 3.08 L/sec
FEF 25-75 Pre: 3.63 L/sec
FEF2575-%Change-Post: -15 %
FEF2575-%Pred-Post: 148 %
FEF2575-%Pred-Pre: 175 %
FEV1-%Change-Post: -1 %
FEV1-%Pred-Post: 119 %
FEV1-%Pred-Pre: 121 %
FEV1-Post: 2.73 L
FEV1-Pre: 2.78 L
FEV1FVC-%Change-Post: 0 %
FEV1FVC-%Pred-Pre: 109 %
FEV6-%Change-Post: -2 %
FEV6-%Pred-Post: 112 %
FEV6-%Pred-Pre: 114 %
FEV6-Post: 3.23 L
FEV6-Pre: 3.3 L
FEV6FVC-%Change-Post: 0 %
FEV6FVC-%Pred-Post: 104 %
FEV6FVC-%Pred-Pre: 104 %
FVC-%Change-Post: -2 %
FVC-%Pred-Post: 107 %
FVC-%Pred-Pre: 110 %
FVC-Post: 3.23 L
FVC-Pre: 3.3 L
Post FEV1/FVC ratio: 85 %
Post FEV6/FVC ratio: 100 %
Pre FEV1/FVC ratio: 84 %
Pre FEV6/FVC Ratio: 100 %
RV % pred: 74 %
RV: 1.48 L
TLC % pred: 99 %
TLC: 4.79 L

## 2018-03-09 NOTE — Patient Instructions (Signed)
Your pulmonary work-up has been very reassuring.  Echocardiogram does not show any evidence for bony hypertension, pulmonary function testing does not show any evidence for asthma/COPD.  Your chest x-ray does not show any evidence of interstitial lung disease or scarring.  Your oxygen levels are normal when you walk. I believe we need to refer you back to Dr. Antoine Poche to consider repeat cardiac stress testing if he thinks this is appropriate.  If the cardiac evaluation is reassuring then I would feel comfortable recommending that you start to increase her exercise, increase cardiopulmonary conditioning.   Please follow-up with Dr. Delton Coombes as needed

## 2018-03-09 NOTE — Assessment & Plan Note (Signed)
Pulmonary evaluation reassuring.  No evidence for LV dysfunction, RV dysfunction or pulmonary hypertension.  PFT did not show any obstruction or restriction, her total lung capacity is normal.  She has a very slightly decreased diffusion capacity but chest x-ray is normal (no evidence for interstitial disease) and no evidence for pulmonary vascular disease either by echocardiogram.  She does not desaturate with ambulation.  At this point I believe she likely is dealing with deconditioning.  I believe she needs a cardiac evaluation since she has had exertional chest discomfort.  Her last stress test was in 2013.  I will refer her back to see Dr. Antoine Poche.  Your pulmonary work-up has been very reassuring.  Echocardiogram does not show any evidence for bony hypertension, pulmonary function testing does not show any evidence for asthma/COPD.  Your chest x-ray does not show any evidence of interstitial lung disease or scarring.  Your oxygen levels are normal when you walk. I believe we need to refer you back to Dr. Antoine Poche to consider repeat cardiac stress testing if he thinks this is appropriate.  If the cardiac evaluation is reassuring then I would feel comfortable recommending that you start to increase her exercise, increase cardiopulmonary conditioning.   Please follow-up with Dr. Delton Coombes as needed

## 2018-03-09 NOTE — Progress Notes (Signed)
Subjective:    Patient ID: Kelli Contreras, female    DOB: Jul 12, 1951, 67 y.o.   MRN: [id number]  HPI 67 year old never smoker with a history of migraine headaches, irritable bowel syndrome, depression, hypertension, GERD.  She is referred today by Dr. Katrinka Blazing for evaluation of dyspnea.  She notes that she has dyspnea with walking, even on flat ground, to the point at which she has to stop to rest, has even had emesis. Has been a problem for about 6 months. No wheeze. No overt pain in legs. She does get some mid chest pain with exertion. Her wt has been stable. She has had some cough but only over the last 2 weeks. She has longstanding Raynaud's phenomenon in her fingers. She had a reassuring cardiac eval 2013. CXR 12/2016 was normal.   ROV 03/09/18 --this is a follow-up visit to evaluate exertional shortness of breath.  She has a history of chest discomfort, also Raynaud's phenomenon.  An echocardiogram was performed on 01/20/2018.  This showed normal LV function with some evidence for grade 1 diastolic dysfunction, normal right ventricular size and function, normal pulmonary arterial pressures.  Her chest x-ray from 01/14/2018 was reviewed by me and showed no abnormality with the exception of some compression fractures and kyphosis.  She had pulmonary function testing today that I have reviewed.  This shows normal spirometry, normal total lung capacity with a slightly decreased residual volume, slightly decreased diffusion capacity that does not fully correct to the normal range when adjusted for alveolar volume.  She did not desaturate on ambulation on room air.    CBC Latest Ref Rng & Units 12/11/2017 07/13/2017 02/11/2017  WBC 3.4 - 10.8 x10E3/uL 4.6 5.5 5.8  Hemoglobin 11.1 - 15.9 g/dL 40.9 81.1 91.4  Hematocrit 34.0 - 46.6 % 42.0 41.7 42.6  Platelets 150 - 379 x10E3/uL 179 179 173     Review of Systems  Constitutional: Positive for appetite change. Negative for fever and unexpected weight change.    HENT: Negative for congestion, dental problem, ear pain, nosebleeds, postnasal drip, rhinorrhea, sinus pressure, sneezing, sore throat and trouble swallowing.   Eyes: Negative for redness and itching.  Respiratory: Positive for shortness of breath. Negative for cough, chest tightness and wheezing.   Cardiovascular: Positive for chest pain. Negative for palpitations and leg swelling.  Gastrointestinal: Negative for nausea and vomiting.  Genitourinary: Negative for dysuria.  Musculoskeletal: Negative for joint swelling.  Skin: Negative for rash.  Neurological: Positive for headaches.  Hematological: Does not bruise/bleed easily.  Psychiatric/Behavioral: Positive for dysphoric mood. The patient is not nervous/anxious.     Past Medical History:  Diagnosis Date  . Cataract   . Depression   . Diarrhea   . Hypertension   . IBS (irritable bowel syndrome)    diarrhea  . Insomnia   . Migraine   . Plantar fasciitis      Family History  Problem Relation Age of Onset  . CAD Father 22  . Cancer Father        bladder cancer  . Diabetes Father   . Heart disease Father   . Hyperlipidemia Father   . Hypertension Father   . Valvular heart disease Mother        MVP  . Heart disease Mother   . Hyperlipidemia Mother   . Hypertension Mother   . Hypertension Brother   . Hypertension Unknown        mother, father, brother  . Breast cancer Maternal Aunt  Social History   Socioeconomic History  . Marital status: Widowed    Spouse name: Not on file  . Number of children: 2  . Years of education: Not on file  . Highest education level: Not on file  Occupational History  . Not on file  Social Needs  . Financial resource strain: Not on file  . Food insecurity:    Worry: Not on file    Inability: Not on file  . Transportation needs:    Medical: Not on file    Non-medical: Not on file  Tobacco Use  . Smoking status: Never Smoker  . Smokeless tobacco: Never Used  Substance and  Sexual Activity  . Alcohol use: No  . Drug use: No  . Sexual activity: Not on file  Lifestyle  . Physical activity:    Days per week: Not on file    Minutes per session: Not on file  . Stress: Not on file  Relationships  . Social connections:    Talks on phone: Not on file    Gets together: Not on file    Attends religious service: Not on file    Active member of club or organization: Not on file    Attends meetings of clubs or organizations: Not on file    Relationship status: Not on file  . Intimate partner violence:    Fear of current or ex partner: Not on file    Emotionally abused: Not on file    Physically abused: Not on file    Forced sexual activity: Not on file  Other Topics Concern  . 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   Data processing manager.      Exercise:  Walking once per week        Allergies  Allergen Reactions  . Other Other (See Comments)    Onions & Chocolate cause migraine     Outpatient Medications Prior to Visit  Medication Sig Dispense Refill  . aspirin 81 MG tablet Take 81 mg by mouth at bedtime.     . DULoxetine (CYMBALTA) 30 MG capsule Take 1 capsule (30 mg total) by mouth daily. 90 capsule 1  . hydrochlorothiazide (HYDRODIURIL) 25 MG tablet Take 1 tablet (25 mg total) by mouth daily. 90 tablet 1  . imipramine (TOFRANIL) 50 MG tablet TAKE 2 TABLETS (100 MG     TOTAL) AT BEDTIME 180 tablet 0  . lisinopril (PRINIVIL,ZESTRIL) 20 MG tablet TAKE 1 TABLET DAILY 90 tablet 1  . meloxicam (MOBIC) 15 MG tablet TAKE 1 TABLET BY MOUTH EVERY DAY 30 tablet 0  . methocarbamol (ROBAXIN) 500 MG tablet Take 1 tablet (500 mg total) by mouth every 8 (eight) hours as needed for muscle spasms. 60 tablet  0  . metoprolol succinate (TOPROL-XL) 50 MG 24 hr tablet TAKE 1 TABLET DAILY WITH ORIMMEDIATELY FOLLOWING A  MEAL. 90 tablet 1  . pantoprazole (PROTONIX) 40 MG tablet Take 1 tablet (40 mg total) by mouth daily. 90 tablet 1  . potassium chloride SA (KLOR-CON M20) 20 MEQ tablet Take 1 tablet (20 mEq total) by mouth daily. 90 tablet 1  .  rizatriptan (MAXALT-MLT) 10 MG disintegrating tablet Take 1 tablet (10 mg total) by mouth as needed for migraine. May repeat in 2 hours if needed 24 tablet 1  . topiramate (TOPAMAX) 100 MG tablet Take 1 tablet (100 mg total) by mouth 2 (two) times daily. 180 tablet 1  . zolpidem (AMBIEN) 5 MG tablet Take 1 tablet (5 mg total) by mouth at bedtime as needed. 60 tablet 0  . imipramine (TOFRANIL) 50 MG tablet TAKE 2 TABLETS (100 MG     TOTAL) AT BEDTIME 180 tablet 1   No facility-administered medications prior to visit.         Objective:   Physical Exam Vitals:   03/09/18 1625  BP: 132/88  Pulse: 84  SpO2: 100%  Weight: 76.2 kg (168 lb)  Height: 5' 2.5" (1.588 m)   Gen: Pleasant, overweight woman, in no distress,  normal affect  ENT: No lesions,  mouth clear,  oropharynx clear, no postnasal drip  Neck: No JVD, no stridor  Lungs: No use of accessory muscles, bilaterally, no wheezing  Cardiovascular: RRR, heart sounds normal, no murmur or gallops, no peripheral edema  Musculoskeletal: No deformities, no cyanosis or clubbing  Neuro: alert, non focal  Skin: Warm, no lesions or rash    Assessment & Plan:  Dyspnea on exertion Pulmonary evaluation reassuring.  No evidence for LV dysfunction, RV dysfunction or pulmonary hypertension.  PFT did not show any obstruction or restriction, her total lung capacity is normal.  She has a very slightly decreased diffusion capacity but chest x-ray is normal (no evidence for interstitial disease) and no evidence for pulmonary vascular disease either by echocardiogram.  She does not desaturate with ambulation.  At this  point I believe she likely is dealing with deconditioning.  I believe she needs a cardiac evaluation since she has had exertional chest discomfort.  Her last stress test was in 2013.  I will refer her back to see Dr. Antoine Poche.  Your pulmonary work-up has been very reassuring.  Echocardiogram does not show any evidence for bony hypertension, pulmonary function testing does not show any evidence for asthma/COPD.  Your chest x-ray does not show any evidence of interstitial lung disease or scarring.  Your oxygen levels are normal when you walk. I believe we need to refer you back to Dr. Antoine Poche to consider repeat cardiac stress testing if he thinks this is appropriate.  If the cardiac evaluation is reassuring then I would feel comfortable recommending that you start to increase her exercise, increase cardiopulmonary conditioning.   Please follow-up with Dr. Delton Coombes as needed  Levy Pupa, MD, PhD 03/09/2018, 4:58 PM [facility] [phone] or if no answer [phone]

## 2018-03-09 NOTE — Progress Notes (Signed)
PFT done today. 

## 2018-03-17 ENCOUNTER — Other Ambulatory Visit: Payer: Self-pay | Admitting: Family Medicine

## 2018-03-17 NOTE — Telephone Encounter (Signed)
HCTZ (hydrodiuril) refill Last OV: 12/11/17 Last Refill:12/11/17 #90 1 RF Pharmacy:[facility] [id number] Katrinka Blazing

## 2018-03-24 ENCOUNTER — Other Ambulatory Visit: Payer: Self-pay | Admitting: Family Medicine

## 2018-03-24 NOTE — Telephone Encounter (Signed)
Refill request for Meloxicam, last filled on 02/08/18#30    LOV: 12/11/17 Dr. Katrinka Blazing

## 2018-03-31 ENCOUNTER — Other Ambulatory Visit: Payer: Self-pay | Admitting: Family Medicine

## 2018-03-31 NOTE — Telephone Encounter (Signed)
Patient called and notified she will need an office visit in order to receive the refill on meloxicam, she says she will call next week to schedule an appointment.

## 2018-04-05 ENCOUNTER — Encounter: Payer: Self-pay | Admitting: Family Medicine

## 2018-05-30 ENCOUNTER — Other Ambulatory Visit: Payer: Self-pay

## 2018-05-30 ENCOUNTER — Ambulatory Visit: Payer: BC Managed Care – PPO | Admitting: Family Medicine

## 2018-05-30 ENCOUNTER — Encounter: Payer: Self-pay | Admitting: Family Medicine

## 2018-05-30 VITALS — BP 112/72 | HR 82 | Temp 98.0°F | Resp 16 | Ht 62.21 in | Wt 164.6 lb

## 2018-05-30 DIAGNOSIS — K909 Intestinal malabsorption, unspecified: Secondary | ICD-10-CM | POA: Diagnosis not present

## 2018-05-30 DIAGNOSIS — R197 Diarrhea, unspecified: Secondary | ICD-10-CM | POA: Diagnosis not present

## 2018-05-30 DIAGNOSIS — K219 Gastro-esophageal reflux disease without esophagitis: Secondary | ICD-10-CM

## 2018-05-30 DIAGNOSIS — M25562 Pain in left knee: Secondary | ICD-10-CM | POA: Diagnosis not present

## 2018-05-30 DIAGNOSIS — G43709 Chronic migraine without aura, not intractable, without status migrainosus: Secondary | ICD-10-CM | POA: Diagnosis not present

## 2018-05-30 DIAGNOSIS — R0609 Other forms of dyspnea: Secondary | ICD-10-CM

## 2018-05-30 DIAGNOSIS — M25561 Pain in right knee: Secondary | ICD-10-CM | POA: Diagnosis not present

## 2018-05-30 DIAGNOSIS — F5104 Psychophysiologic insomnia: Secondary | ICD-10-CM | POA: Diagnosis not present

## 2018-05-30 DIAGNOSIS — Z1211 Encounter for screening for malignant neoplasm of colon: Secondary | ICD-10-CM

## 2018-05-30 DIAGNOSIS — R1114 Bilious vomiting: Secondary | ICD-10-CM

## 2018-05-30 DIAGNOSIS — R0789 Other chest pain: Secondary | ICD-10-CM

## 2018-05-30 DIAGNOSIS — I10 Essential (primary) hypertension: Secondary | ICD-10-CM | POA: Diagnosis not present

## 2018-05-30 MED ORDER — TOPIRAMATE 100 MG PO TABS: 100.0000 mg | ORAL_TABLET | Freq: Two times a day (BID) | ORAL | 1 refills | Status: DC

## 2018-05-30 MED ORDER — RIZATRIPTAN BENZOATE 10 MG PO TBDP: 10.0000 mg | ORAL_TABLET | ORAL | 1 refills | Status: DC | PRN

## 2018-05-30 MED ORDER — METHOCARBAMOL 500 MG PO TABS: 500.0000 mg | ORAL_TABLET | Freq: Three times a day (TID) | ORAL | 0 refills | Status: DC | PRN

## 2018-05-30 MED ORDER — MELOXICAM 15 MG PO TABS: 15.0000 mg | ORAL_TABLET | Freq: Every day | ORAL | 1 refills | Status: DC

## 2018-05-30 MED ORDER — IMIPRAMINE HCL 50 MG PO TABS: ORAL_TABLET | ORAL | 1 refills | Status: DC

## 2018-05-30 MED ORDER — METOPROLOL SUCCINATE ER 50 MG PO TB24: ORAL_TABLET | ORAL | 1 refills | Status: DC

## 2018-05-30 MED ORDER — POTASSIUM CHLORIDE CRYS ER 20 MEQ PO TBCR: 20.0000 meq | EXTENDED_RELEASE_TABLET | Freq: Every day | ORAL | 1 refills | Status: DC

## 2018-05-30 MED ORDER — HYDROCHLOROTHIAZIDE 25 MG PO TABS: 25.0000 mg | ORAL_TABLET | Freq: Every day | ORAL | 1 refills | Status: DC

## 2018-05-30 MED ORDER — LISINOPRIL 20 MG PO TABS: ORAL_TABLET | ORAL | 1 refills | Status: DC

## 2018-05-30 MED ORDER — COLESTIPOL HCL 5 G PO GRAN: 5.0000 g | GRANULES | Freq: Two times a day (BID) | ORAL | 3 refills | Status: DC

## 2018-05-30 NOTE — Progress Notes (Signed)
Subjective:    Patient ID: Kelli Contreras, female    DOB: Jul 25, 1951, 67 y.o.   MRN: [id number]  05/30/2018  Chronic Conditions (6 month follow-up )    HPI This 67 y.o. female presents for six month follow-up evaluation of arthritis, hypertension, migraines, anxiety/depression, dyspnea management changes made at last visit include the following: . Hypertension well controlled.  Obtain labs.  Refills provided.  No changes in management. Suffering with intermittent nausea with vomiting and some diarrhea with exertion.  Status post extensive cardiac evaluation by cardiology in 2013 with negative workup.  Symptoms suggestive of GERD intermittently.  Treat with Protonix 40 mg daily for the next 2 months.  If no improvement, refer to cardiology  Obtain amylase and lipase to rule out secondary pathology. History of IBS diarrhea.  Chronic dyspnea on exertion interfering with exercise.  Status post extensive cardiology evaluation in 2013  for same symptoms with negative workup.  Refer to pulmonology.  Obtain EKG.  Acute onset non-migrainous headache.  Consistent with neck strain.  Status post cervical spine films revealed degenerative changes throughout.  Treat with Mobic and Robaxin; home exercise program provided.  If no improvement In 6 weeks, refer to orthopedics. Migraines well controlled on current regimen.  Refills provided Topamax and Imipramine and Metoprolol and Maxalt. Labs all normal at last visit in February 2019. Cervical spine films revealed multilevel cervical spondylitic changes C3-T2.     UPDATE:  DOE: underwent pulmonology consultation by Dr. Delton CoombesByrum who recommended repeat cardiology consultation.  Appreciated Raynaud's changes and suggested autoimmune etiology.  Underwent PFTs (normal), echo (Grade 1 diastolic dysfunction), CXR (WNL).  Consistent with deconditioning yet highly recommended cardiac reevaluation prior to recommending exercise.   UPDATE: Arthralgias:  Meloxicam  refill was denied in past month.  Can hardly walk; about to dry ragged; cannot keep up with her.  Neck pain is fine; better when taking medication.  Hurts all over; knees; hips; lower back pain. Elbows.  Continues to suffer with intermittent urgent diarrhea and nausea with vomiting; diarrhea does not always accompany vomiting and vice versa.  Patient has chronically suffered with the symptoms.  Prior primary care provider prescribed colestipol for diarrhea with benefit.  Patient is requesting a refill of this medication.  Denies abdominal pain.  No previous cholecystectomy.  Cannot identify certain foods as triggers.  Patient is due for colonoscopy.  Hypertension:  Patient reports good compliance with medication, good tolerance to medication, and good symptom control.  Not checking blood pressure at home.  Migraines: Moderately well controlled and have improved.  Doing well at this time.  Anxiety with depression.  Cymbalta therapy prescribed at last visit to replace Lexapro due to fatigue.  Patient did not start Cymbalta.  She wanted to see if she could handle stressors and emotions without medication.  She is doing very well not she is retired and taking care of her granddaughter.  Emotionally doing well.  Chest pain with dyspnea on exertion: Dyspnea continues but has not worsened.  Chest pain has actually improved but can occur intermittently.    BP Readings from Last 3 Encounters:  05/30/18 112/72  03/09/18 132/88  01/14/18 124/84   Wt Readings from Last 3 Encounters:  05/30/18 164 lb 9.6 oz (74.7 kg)  03/09/18 168 lb (76.2 kg)  01/14/18 171 lb (77.6 kg)   Immunization History  Administered Date(s) Administered  . Tdap 02/11/2017    Review of Systems  Constitutional: Negative for activity change, appetite change, chills, diaphoresis, fatigue,  fever and unexpected weight change.  HENT: Negative for congestion, dental problem, drooling, ear discharge, ear pain, facial swelling,  hearing loss, mouth sores, nosebleeds, postnasal drip, rhinorrhea, sinus pressure, sneezing, sore throat, tinnitus, trouble swallowing and voice change.   Eyes: Negative for photophobia, pain, discharge, redness, itching and visual disturbance.  Respiratory: Positive for shortness of breath. Negative for apnea, cough, choking, chest tightness, wheezing and stridor.   Cardiovascular: Positive for chest pain. Negative for palpitations and leg swelling.  Gastrointestinal: Negative for abdominal distention, abdominal pain, anal bleeding, blood in stool, constipation, diarrhea, nausea, rectal pain and vomiting.  Endocrine: Negative for cold intolerance, heat intolerance, polydipsia, polyphagia and polyuria.  Genitourinary: Negative for decreased urine volume, difficulty urinating, dyspareunia, dysuria, enuresis, flank pain, frequency, genital sores, hematuria, menstrual problem, pelvic pain, urgency, vaginal bleeding, vaginal discharge and vaginal pain.  Musculoskeletal: Positive for arthralgias, back pain, myalgias, neck pain and neck stiffness. Negative for gait problem and joint swelling.  Skin: Negative for color change, pallor, rash and wound.  Allergic/Immunologic: Negative for environmental allergies, food allergies and immunocompromised state.  Neurological: Positive for headaches. Negative for dizziness, tremors, seizures, syncope, facial asymmetry, speech difficulty, weakness, light-headedness and numbness.  Hematological: Negative for adenopathy. Does not bruise/bleed easily.  Psychiatric/Behavioral: Negative for agitation, behavioral problems, confusion, decreased concentration, dysphoric mood, hallucinations, self-injury, sleep disturbance and suicidal ideas. The patient is not nervous/anxious and is not hyperactive.     Past Medical History:  Diagnosis Date  . Cataract   . Depression   . Diarrhea   . Hypertension   . IBS (irritable bowel syndrome)    diarrhea  . Insomnia   . Migraine    . Plantar fasciitis    Past Surgical History:  Procedure Laterality Date  . ABDOMINAL HYSTERECTOMY     DUB; ovaries intact.  Marland Kitchen BREAST CYST ASPIRATION Bilateral   . BUNIONECTOMY  2007   Dr. Lestine Box  . VESICOVAGINAL FISTULA CLOSURE W/ TAH  1985   Dr. Nicholas Lose   Allergies  Allergen Reactions  . Other Other (See Comments)    Onions & Chocolate cause migraine   Current Outpatient Medications on File Prior to Visit  Medication Sig Dispense Refill  . aspirin 81 MG tablet Take 81 mg by mouth at bedtime.     . DULoxetine (CYMBALTA) 30 MG capsule Take 1 capsule (30 mg total) by mouth daily. 90 capsule 1  . pantoprazole (PROTONIX) 40 MG tablet Take 1 tablet (40 mg total) by mouth daily. 90 tablet 1  . zolpidem (AMBIEN) 5 MG tablet Take 1 tablet (5 mg total) by mouth at bedtime as needed. 60 tablet 0   No current facility-administered medications on file prior to visit.    Social History   Socioeconomic History  . Marital status: Widowed    Spouse name: Not on file  . Number of children: 2  . Years of education: Not on file  . Highest education level: Not on file  Occupational History  . Not on file  Social Needs  . Financial resource strain: Not on file  . Food insecurity:    Worry: Not on file    Inability: Not on file  . Transportation needs:    Medical: Not on file    Non-medical: Not on file  Tobacco Use  . Smoking status: Never Smoker  . Smokeless tobacco: Never Used  Substance and Sexual Activity  . Alcohol use: No  . Drug use: No  . Sexual activity: Not on file  Lifestyle  .  Physical activity:    Days per week: Not on file    Minutes per session: Not on file  . Stress: Not on file  Relationships  . Social connections:    Talks on phone: Not on file    Gets together: Not on file    Attends religious service: Not on file    Active member of club or organization: Not on file    Attends meetings of clubs or organizations: Not on file    Relationship status: Not  on file  . Intimate partner violence:    Fear of current or ex partner: Not on file    Emotionally abused: Not on file    Physically abused: Not on file    Forced sexual activity: Not on file  Other Topics Concern  . 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   Data processing manager.      Exercise:  Walking once per week      Family History  Problem Relation Age of Onset  . CAD Father 61  . Cancer Father        bladder cancer  . Diabetes Father   . Heart disease Father   . Hyperlipidemia Father   . Hypertension Father   . Valvular heart disease Mother        MVP  . Heart disease Mother   . Hyperlipidemia Mother   . Hypertension Mother   . Hypertension Brother   . Hypertension Unknown        mother, father, brother  . Breast cancer Maternal Aunt        Objective:    BP 112/72   Pulse 82   Temp 98 F (36.7 C) (Oral)   Resp 16   Ht 5' 2.21" (1.58 m)   Wt 164 lb 9.6 oz (74.7 kg)   SpO2 98%   BMI 29.91 kg/m  Physical Exam  Constitutional: She is oriented to person, place, and time. She appears well-developed and well-nourished. No distress.  HENT:  Head: Normocephalic and atraumatic.  Right Ear: External ear normal.  Left Ear: External ear normal.  Nose: Nose normal.  Mouth/Throat: Oropharynx is clear and moist.  Eyes: Pupils are equal, round, and reactive to light. Conjunctivae and EOM are normal.  Neck: Normal range of motion. Neck supple. Carotid bruit is not present. No thyromegaly present.  Cardiovascular: Normal rate, regular rhythm, normal heart sounds and intact distal pulses. Exam reveals no gallop and no friction rub.  No murmur heard. Pulmonary/Chest: Effort normal and breath sounds normal. She  has no wheezes. She has no rales.  Abdominal: Soft. Bowel sounds are normal. She exhibits no distension and no mass. There is no tenderness. There is no rebound and no guarding.  Musculoskeletal:       Right hand: She exhibits deformity.       Left hand: She exhibits deformity.  arthritis joint deformity present diffusely on fingers.  Lymphadenopathy:    She has no cervical adenopathy.  Neurological: She is alert and oriented to person, place, and time. She displays normal reflexes. No cranial nerve deficit. She exhibits normal muscle tone. Coordination  normal.  Skin: Skin is warm and dry. No rash noted. She is not diaphoretic. No erythema. No pallor.  Psychiatric: She has a normal mood and affect. Her behavior is normal. Judgment normal.   No results found. Depression screen [facility] 2/9 05/30/2018 07/13/2017 02/11/2017 01/01/2017 05/16/2016  Decreased Interest 0 0 0 0 0  Down, Depressed, Hopeless 0 0 0 0 0  PHQ - 2 Score 0 0 0 0 0   Fall Risk  05/30/2018 07/13/2017 02/11/2017 01/01/2017 05/16/2016  Falls in the past year? No No No No No        Assessment & Plan:   1. Essential hypertension   2. Chronic migraine without aura without status migrainosus, not intractable   3. Gastroesophageal reflux disease without esophagitis   4. Dyspnea on exertion   5. Psychophysiological insomnia   6. Arthralgia of both knees   7. Bilious vomiting with nausea   8. Diarrhea due to malabsorption   9. Colon cancer screening   10. Other chest pain     Dyspnea on exertion: Status post pulmonology evaluation with normal pulmonary function test, normal chest x-ray.  Echocardiogram urogram revealed grade 1 diastolic dysfunction only.  Pulmonologist and I both recommend return to cardiology for stress testing.  Patient desires to defer this referral until next appointment.  Diarrhea and intermittent nausea with vomiting: Ongoing without improvement.  Refer for abdominal ultrasound to evaluate for gallbladder pathology.   Refer to gastroenterology.  Patient also is due for colon cancer screening.  Agreeable to trial of colestipol.  No improvement with Protonix therapy.  Arthralgias: Diffuse including hands, knees, shoulders, elbows, lower back, neck.  Obtain autoimmune labs.  Refill of meloxicam provided yet discussed risk of daily or frequent NSAID use.  Recommend alternating meloxicam with Tylenol therapy.  Also recommend using meloxicam only for active pain and not to prevent pain.  Migraines: Well-controlled at this time on current regimen.  Refills provided.  Hypertension: Well-controlled at this time.  Obtain labs for chronic disease management.  Anxiety with depression: Much improved and currently not taking any medication.  Patient states she might consider restarting Cymbalta therapy.  Orders Placed This Encounter  Procedures  . US Abdomen Complete    Standing Status:   Future    Standing Expiration Date:   08/01/2019    Order Specific Question:   Reason for Exam (SYMPTOM  OR DIAGNOSIS REQUIRED)    Answer:   nausea/vomiting/diarrhea    Order Specific Question:   Preferred imaging location?    Answer:   [facility]  . CBC with Differential/Platelet  . Comprehensive metabolic panel    Order Specific Question:   Has the patient fasted?    Answer:   No  . CK  . Sedimentation rate  . ANA,IFA RA Diag Pnl w/rflx Tit/Patn  . Rheumatoid Arthritis Profile  . Ambulatory referral to Gastroenterology    Referral Priority:   Routine    Referral Type:   Consultation    Referral Reason:   Specialty Services Required    Number of Visits Requested:   1   Meds ordered this encounter  Medications  . hydrochlorothiazide (HYDRODIURIL) 25 MG tablet    Sig: Take 1 tablet (25 mg total) by mouth daily.    Dispense:  90 tablet    Refill:  1  . imipramine (TOFRANIL) 50 MG tablet    Sig: TAKE 2 TABLETS (100 MG     TOTAL) AT BEDTIME    Dispense:  180  tablet    Refill:  1  . lisinopril  (PRINIVIL,ZESTRIL) 20 MG tablet    Sig: TAKE 1 TABLET DAILY    Dispense:  90 tablet    Refill:  1  . meloxicam (MOBIC) 15 MG tablet    Sig: Take 1 tablet (15 mg total) by mouth daily.    Dispense:  90 tablet    Refill:  1  . methocarbamol (ROBAXIN) 500 MG tablet    Sig: Take 1 tablet (500 mg total) by mouth every 8 (eight) hours as needed for muscle spasms.    Dispense:  90 tablet    Refill:  0  . metoprolol succinate (TOPROL-XL) 50 MG 24 hr tablet    Sig: TAKE 1 TABLET DAILY WITH ORIMMEDIATELY FOLLOWING A  MEAL.    Dispense:  90 tablet    Refill:  1  . potassium chloride SA (KLOR-CON M20) 20 MEQ tablet    Sig: Take 1 tablet (20 mEq total) by mouth daily.    Dispense:  90 tablet    Refill:  1  . rizatriptan (MAXALT-MLT) 10 MG disintegrating tablet    Sig: Take 1 tablet (10 mg total) by mouth as needed for migraine. May repeat in 2 hours if needed    Dispense:  24 tablet    Refill:  1  . topiramate (TOPAMAX) 100 MG tablet    Sig: Take 1 tablet (100 mg total) by mouth 2 (two) times daily.    Dispense:  180 tablet    Refill:  1  . colestipol (COLESTID) 5 g granules    Sig: Take 5 g by mouth 2 (two) times daily.    Dispense:  500 g    Refill:  3    Return in about 6 months (around 11/30/2018) for complete physical examiniation SANTIAGO.   Kristi Paulita Fujita, M.D. Primary Care at [facility] [place] previously [facility] [address] [place], [place]  [address] [phone] phone [phone] fax

## 2018-05-30 NOTE — Patient Instructions (Signed)
     IF you received an x-ray today, you will receive an invoice from [facility]. Please contact [facility] at [phone] with questions or concerns regarding your invoice.   IF you received labwork today, you will receive an invoice from [facility]. Please contact [facility] at [phone] with questions or concerns regarding your invoice.   Our billing staff will not be able to assist you with questions regarding bills from these companies.  You will be contacted with the lab results as soon as they are available. The fastest way to get your results is to activate your My Chart account. Instructions are located on the last page of this paperwork. If you have not heard from us regarding the results in 2 weeks, please contact this office.     

## 2018-06-01 LAB — COMPREHENSIVE METABOLIC PANEL
A/G RATIO: 1.6 (ref 1.2–2.2)
ALT: 10 IU/L (ref 0–32)
AST: 16 IU/L (ref 0–40)
Albumin: 4.2 g/dL (ref 3.6–4.8)
Alkaline Phosphatase: 96 IU/L (ref 39–117)
BILIRUBIN TOTAL: 0.3 mg/dL (ref 0.0–1.2)
BUN/Creatinine Ratio: 20 (ref 12–28)
BUN: 19 mg/dL (ref 8–27)
CALCIUM: 9 mg/dL (ref 8.7–10.3)
CHLORIDE: 105 mmol/L (ref 96–106)
CO2: 26 mmol/L (ref 20–29)
Creatinine, Ser: 0.96 mg/dL (ref 0.57–1.00)
GFR calc non Af Amer: 63 mL/min/{1.73_m2} (ref >59)
GFR, EST AFRICAN AMERICAN: 72 mL/min/{1.73_m2} (ref >59)
GLUCOSE: 85 mg/dL (ref 65–99)
Globulin, Total: 2.7 g/dL (ref 1.5–4.5)
POTASSIUM: 4 mmol/L (ref 3.5–5.2)
Sodium: 145 mmol/L — ABNORMAL HIGH (ref 134–144)
TOTAL PROTEIN: 6.9 g/dL (ref 6.0–8.5)

## 2018-06-01 LAB — CBC WITH DIFFERENTIAL/PLATELET
BASOS ABS: 0 10*3/uL (ref 0.0–0.2)
Basos: 0 %
EOS (ABSOLUTE): 0 10*3/uL (ref 0.0–0.4)
Eos: 0 %
Hematocrit: 41.8 % (ref 34.0–46.6)
Hemoglobin: 13.7 g/dL (ref 11.1–15.9)
IMMATURE GRANS (ABS): 0 10*3/uL (ref 0.0–0.1)
IMMATURE GRANULOCYTES: 0 %
Lymphocytes Absolute: 2 10*3/uL (ref 0.7–3.1)
Lymphs: 37 %
MCH: 29.3 pg (ref 26.6–33.0)
MCHC: 32.8 g/dL (ref 31.5–35.7)
MCV: 90 fL (ref 79–97)
MONOCYTES: 10 %
MONOS ABS: 0.5 10*3/uL (ref 0.1–0.9)
Neutrophils Absolute: 2.8 10*3/uL (ref 1.4–7.0)
Neutrophils: 53 %
Platelets: 178 10*3/uL (ref 150–450)
RBC: 4.67 x10E6/uL (ref 3.77–5.28)
RDW: 13.6 % (ref 12.3–15.4)
WBC: 5.4 10*3/uL (ref 3.4–10.8)

## 2018-06-01 LAB — CK: Total CK: 60 U/L (ref 24–173)

## 2018-06-01 LAB — ANA,IFA RA DIAG PNL W/RFLX TIT/PATN
ANA TITER 1: NEGATIVE
Cyclic Citrullin Peptide Ab: 3 units (ref 0–19)

## 2018-06-01 LAB — SEDIMENTATION RATE: SED RATE: 2 mm/h (ref 0–40)

## 2018-06-21 ENCOUNTER — Telehealth: Payer: Self-pay | Admitting: Family Medicine

## 2018-06-21 MED ORDER — LOPERAMIDE HCL 2 MG PO TABS: 2.0000 mg | ORAL_TABLET | Freq: Four times a day (QID) | ORAL | 0 refills | Status: DC | PRN

## 2018-06-21 NOTE — Telephone Encounter (Signed)
Copied from CRM [id number]. Topic: Quick Communication - See Telephone Encounter >> Jun 21, 2018 10:11 AM Lorrine KinMcGee, Blas Riches B, NT wrote: CRM for notification. See Telephone encounter for: 06/21/18. Patient calling and states that she was seen last week for diarrhea and was prescribed a medication and it is not helping at all. States that she is on vacation now and needs something that will help. States that she has also taken pepto bismal and it is not helping either. Would like something sent to the pharmacy to help with the diarrhea. [id number] - [place], [place] - [address]

## 2018-06-21 NOTE — Telephone Encounter (Signed)
Sent rx for immodium. Notified via my chart

## 2018-06-21 NOTE — Telephone Encounter (Signed)
Pt called back again about this.  She is suppose to be on vacation and she stated she can even go out on the beach.  She would like a call back as soon has possible  [facility] [phone]

## 2018-06-27 ENCOUNTER — Ambulatory Visit: Payer: BC Managed Care – PPO | Admitting: Family Medicine

## 2018-07-04 ENCOUNTER — Other Ambulatory Visit: Payer: Self-pay

## 2018-07-04 ENCOUNTER — Telehealth: Payer: Self-pay | Admitting: Internal Medicine

## 2018-07-04 DIAGNOSIS — R197 Diarrhea, unspecified: Secondary | ICD-10-CM

## 2018-07-04 NOTE — Telephone Encounter (Signed)
Spoke with pt and she is aware. Orders in epic. Pt on cancellation list.

## 2018-07-04 NOTE — Telephone Encounter (Signed)
Pt scheduled to see Willette ClusterPaula Guenther NP 07/19/18. Reports she has been having diarrhea for 3 weeks. She saw her PCP and was given Imodium to take and colestipol but reports nothing has helped. She is scheduled for an US of her stomach next Tuesday. Pt reports she has not had any stool studies done. Please advise if pt may have stool studies done while waiting on appt.

## 2018-07-04 NOTE — Telephone Encounter (Signed)
Looks like we have not seen her in greater than a decade. Agree with stool studies. GI pathogen panel and Clostridium difficile stool studies please. You may keep her on a cancellation list so that she may be seen sooner if possible. Thank you

## 2018-07-05 ENCOUNTER — Other Ambulatory Visit: Payer: BC Managed Care – PPO

## 2018-07-06 ENCOUNTER — Other Ambulatory Visit: Payer: BC Managed Care – PPO

## 2018-07-06 DIAGNOSIS — R197 Diarrhea, unspecified: Secondary | ICD-10-CM

## 2018-07-09 LAB — CLOSTRIDIUM DIFFICILE BY PCR: Toxigenic C. Difficile by PCR: NEGATIVE

## 2018-07-12 ENCOUNTER — Ambulatory Visit
Admission: RE | Admit: 2018-07-12 | Discharge: 2018-07-12 | Disposition: A | Discharge status: Home / Self Care | Payer: BC Managed Care – PPO | Source: Ambulatory Visit | Attending: Family Medicine | Admitting: Family Medicine

## 2018-07-12 DIAGNOSIS — R1114 Bilious vomiting: Secondary | ICD-10-CM

## 2018-07-12 DIAGNOSIS — K909 Intestinal malabsorption, unspecified: Secondary | ICD-10-CM

## 2018-07-12 DIAGNOSIS — R197 Diarrhea, unspecified: Secondary | ICD-10-CM

## 2018-07-14 LAB — GASTROINTESTINAL PATHOGEN PANEL PCR
C. difficile Tox A/B, PCR: NOT DETECTED
Campylobacter, PCR: NOT DETECTED
Cryptosporidium, PCR: NOT DETECTED
E COLI (STEC) STX1/STX2, PCR: NOT DETECTED
E coli (ETEC) LT/ST PCR: NOT DETECTED
E coli 0157, PCR: NOT DETECTED
Giardia lamblia, PCR: NOT DETECTED
NOROVIRUS, PCR: NOT DETECTED
ROTAVIRUS, PCR: NOT DETECTED
SALMONELLA, PCR: NOT DETECTED
Shigella, PCR: NOT DETECTED

## 2018-07-18 LAB — HM COLONOSCOPY

## 2018-07-19 ENCOUNTER — Encounter: Payer: Self-pay | Admitting: Nurse Practitioner

## 2018-07-19 ENCOUNTER — Ambulatory Visit: Payer: BC Managed Care – PPO | Admitting: Nurse Practitioner

## 2018-07-19 ENCOUNTER — Encounter

## 2018-07-19 ENCOUNTER — Encounter: Payer: Self-pay | Admitting: Internal Medicine

## 2018-07-19 VITALS — BP 108/62 | HR 63 | Ht 62.0 in | Wt 160.2 lb

## 2018-07-19 DIAGNOSIS — R197 Diarrhea, unspecified: Secondary | ICD-10-CM | POA: Diagnosis not present

## 2018-07-19 DIAGNOSIS — K625 Hemorrhage of anus and rectum: Secondary | ICD-10-CM

## 2018-07-19 DIAGNOSIS — K649 Unspecified hemorrhoids: Secondary | ICD-10-CM | POA: Diagnosis not present

## 2018-07-19 DIAGNOSIS — Z1211 Encounter for screening for malignant neoplasm of colon: Secondary | ICD-10-CM | POA: Diagnosis not present

## 2018-07-19 MED ORDER — NA SULFATE-K SULFATE-MG SULF 17.5-3.13-1.6 GM/177ML PO SOLN: ORAL | 0 refills | Status: DC

## 2018-07-19 MED ORDER — DIPHENOXYLATE-ATROPINE 2.5-0.025 MG PO TABS: 1.0000 | ORAL_TABLET | Freq: Two times a day (BID) | ORAL | 0 refills | Status: DC | PRN

## 2018-07-19 MED ORDER — HYDROCORTISONE 2.5 % RE CREA: 1.0000 "application " | TOPICAL_CREAM | Freq: Every day | RECTAL | 1 refills | Status: DC

## 2018-07-19 NOTE — Progress Notes (Signed)
GI Provider:  Yancey Flemings, MD (remote)  Chief Complaint: Diarrhea  Referring Provider:  Nilda Simmer, MD   ASSESSMENT AND PLAN;   73.  67 year old female with 1 month history of watery diarrhea of unclear etiology and associated with 8 pound weight loss.  Stool studies negative.  Cannot correlate with any medication or dietary changes.  Abdominal exam benign, afebrile, labs unremarkable (at least ones in mid July).  -Offered Lomotil to take as needed, she will think about it -Patient is overdue for screening colonoscopy.  Can further evaluate diarrhea at time of colonoscopy, consider random biopsies to rule out microscopic colitis (at risk based on some of her medications)  2. Scant rectal bleeding, attributes to hemorrhoidal irritation due to the diarrhea.   -Trial of Anusol cream nightly for 7 days    HPI:    Patient is a 67 year old female with HTN, migraines, here for evaluation of diarrhea.  She has a 1 month history of watery stools.  At onset she was having nearly 10 watery bowel movements a day including nocturnal stooling.  She tried Imodium, Pepto, Questran but nothing helped.  She even doubled the doses on these meds without relief.  The diarrhea was not associated with any cramping or fevers.  She has a long history of hemorrhoids and they have been bleeding however.  Patient cannot correlate the onset of diarrhea with any medication changes as she has not had any.  She has made no dietary changes.  No out of country travel , no recent antibiotics.  Stool pathogen panel late May was negative as well as rotavirus and norovirus PCRs.  Over the last several days diarrhea frequency has significantly improved, now only having 3-4 loose bowel movements a day.  There is no relationship between eating and the bowel movements.  A couple of days following the onset of diarrhea her stools were black but again she had been taking bismuth.  She has had some intermittent nausea vomiting, so this  is more of a chronic problem however.  She has lost 8 pounds since the onset of diarrhea.  Of note, prior to the onset of diarrhea patient would have a normal solid stool once a day or every other day.  Patient was sent a colonoscopy recall letter in 2017.  She has not had the procedure done because of the bowel prep which she dreads.  Labs 05/30/18:  Serum chemistries, CBC normal.  07/06/18 Norovirus and Rotavirus PCR, path panel negative.    Past Medical History:  Diagnosis Date  . Cataract   . Depression   . Diarrhea   . Hypertension   . IBS (irritable bowel syndrome)    diarrhea  . Insomnia   . Migraine   . Plantar fasciitis      Past Surgical History:  Procedure Laterality Date  . ABDOMINAL HYSTERECTOMY     DUB; ovaries intact.  Marland Kitchen BREAST CYST ASPIRATION Bilateral   . BUNIONECTOMY  2007   Dr. Lestine Box  . VESICOVAGINAL FISTULA CLOSURE W/ TAH  1985   Dr. Nicholas Lose   Family History  Problem Relation Age of Onset  . CAD Father 4  . Cancer Father        bladder cancer  . Diabetes Father   . Heart disease Father   . Hyperlipidemia Father   . Hypertension Father   . Valvular heart disease Mother        MVP  . Heart disease Mother   . Hyperlipidemia Mother   .  Hypertension Mother   . Hypertension Brother   . Hypertension Unknown        mother, father, brother  . Breast cancer Maternal Aunt    Social History   Tobacco Use  . Smoking status: Never Smoker  . Smokeless tobacco: Never Used  Substance Use Topics  . Alcohol use: No  . Drug use: No   Current Outpatient Medications  Medication Sig Dispense Refill  . aspirin 81 MG tablet Take 81 mg by mouth at bedtime.     . colestipol (COLESTID) 5 g granules Take 5 g by mouth 2 (two) times daily. 500 g 3  . DULoxetine (CYMBALTA) 30 MG capsule Take 1 capsule (30 mg total) by mouth daily. 90 capsule 1  . hydrochlorothiazide (HYDRODIURIL) 25 MG tablet Take 1 tablet (25 mg total) by mouth daily. 90 tablet 1  . imipramine  (TOFRANIL) 50 MG tablet TAKE 2 TABLETS (100 MG     TOTAL) AT BEDTIME 180 tablet 1  . lisinopril (PRINIVIL,ZESTRIL) 20 MG tablet TAKE 1 TABLET DAILY 90 tablet 1  . loperamide (IMODIUM A-D) 2 MG tablet Take 1 tablet (2 mg total) by mouth 4 (four) times daily as needed for diarrhea or loose stools. 30 tablet 0  . meloxicam (MOBIC) 15 MG tablet Take 1 tablet (15 mg total) by mouth daily. 90 tablet 1  . methocarbamol (ROBAXIN) 500 MG tablet Take 1 tablet (500 mg total) by mouth every 8 (eight) hours as needed for muscle spasms. 90 tablet 0  . metoprolol succinate (TOPROL-XL) 50 MG 24 hr tablet TAKE 1 TABLET DAILY WITH ORIMMEDIATELY FOLLOWING A  MEAL. 90 tablet 1  . pantoprazole (PROTONIX) 40 MG tablet Take 1 tablet (40 mg total) by mouth daily. 90 tablet 1  . potassium chloride SA (KLOR-CON M20) 20 MEQ tablet Take 1 tablet (20 mEq total) by mouth daily. 90 tablet 1  . rizatriptan (MAXALT-MLT) 10 MG disintegrating tablet Take 1 tablet (10 mg total) by mouth as needed for migraine. May repeat in 2 hours if needed 24 tablet 1  . topiramate (TOPAMAX) 100 MG tablet Take 1 tablet (100 mg total) by mouth 2 (two) times daily. 180 tablet 1  . zolpidem (AMBIEN) 5 MG tablet Take 1 tablet (5 mg total) by mouth at bedtime as needed. 60 tablet 0   No current facility-administered medications for this visit.    Allergies  Allergen Reactions  . Other Other (See Comments)    Onions & Chocolate cause migraine     Review of Systems: All systems reviewed and negative except where noted in HPI.   Creatinine clearance cannot be calculated (Patient's most recent lab result is older than the maximum 21 days allowed.)   Physical Exam:    Wt Readings from Last 3 Encounters:  05/30/18 164 lb 9.6 oz (74.7 kg)  03/09/18 168 lb (76.2 kg)  01/14/18 171 lb (77.6 kg)    There were no vitals taken for this visit. Constitutional:  Pleasant female in no acute distress. Psychiatric: Normal mood and affect. Behavior is  normal. EENT: Pupils normal.  Conjunctivae are normal. No scleral icterus. Neck supple.  Cardiovascular: Normal rate, regular rhythm. No edema Pulmonary/chest: Effort normal and breath sounds normal. No wheezing, rales or rhonchi. Abdominal: Soft, nondistended, nontender. Bowel sounds active throughout. There are no masses palpable. No hepatomegaly. Neurological: Alert and oriented to person place and time. Skin: Skin is warm and dry. No rashes noted.  Kelli Cluster, NP  07/19/2018, 1:25 PM  CC:  Ethelda Chick, MD

## 2018-07-19 NOTE — Progress Notes (Signed)
Assessment and plan reviewed 

## 2018-07-19 NOTE — Patient Instructions (Signed)
If you are age 65 or older, your body mass index should be between 23-30. Your Body mass index is 29.3 kg/m. If this is out of the aforementioned range listed, please consider follow up with your Primary Care Provider.  If you are age 31 or younger, your body mass index should be between 19-25. Your Body mass index is 29.3 kg/m. If this is out of the aformentioned range listed, please consider follow up with your Primary Care Provider.   You have been scheduled for a colonoscopy. Please follow written instructions given to you at your visit today.  Please pick up your prep supplies at the pharmacy within the next 1-3 days. If you use inhalers (even only as needed), please bring them with you on the day of your procedure. Your physician has requested that you go to www.startemmi.com and enter the access code given to you at your visit today. This web site gives a general overview about your procedure. However, you should still follow specific instructions given to you by our office regarding your preparation for the procedure.   We have sent the following medications to your pharmacy for you to pick up at your convenience: Suprep Lomotil Anusol  Thank you for choosing me and [facility].   Willette Cluster, NP

## 2018-07-25 ENCOUNTER — Ambulatory Visit (AMBULATORY_SURGERY_CENTER): Payer: BC Managed Care – PPO | Admitting: Internal Medicine

## 2018-07-25 ENCOUNTER — Encounter: Payer: Self-pay | Admitting: Internal Medicine

## 2018-07-25 VITALS — BP 118/68 | HR 70 | Temp 97.5°F | Resp 14 | Ht 62.0 in | Wt 160.0 lb

## 2018-07-25 DIAGNOSIS — Z1211 Encounter for screening for malignant neoplasm of colon: Secondary | ICD-10-CM

## 2018-07-25 DIAGNOSIS — R197 Diarrhea, unspecified: Secondary | ICD-10-CM

## 2018-07-25 DIAGNOSIS — K599 Functional intestinal disorder, unspecified: Secondary | ICD-10-CM | POA: Diagnosis not present

## 2018-07-25 MED ORDER — SODIUM CHLORIDE 0.9 % IV SOLN: 500.0000 mL | Freq: Once | INTRAVENOUS | Status: DC

## 2018-07-25 NOTE — Progress Notes (Signed)
Called to room to assist during endoscopic procedure.  Patient ID and intended procedure confirmed with present staff. Received instructions for my participation in the procedure from the performing physician.  

## 2018-07-25 NOTE — Progress Notes (Signed)
Report to PACU, RN, vss, BBS= Clear.  

## 2018-07-25 NOTE — Patient Instructions (Signed)
YOU HAD AN ENDOSCOPIC PROCEDURE TODAY AT THE [facility]:   Refer to the procedure report that was given to you for any specific questions about what was found during the examination.  If the procedure report does not answer your questions, please call your gastroenterologist to clarify.  If you requested that your care partner not be given the details of your procedure findings, then the procedure report has been included in a sealed envelope for you to review at your convenience later.  YOU SHOULD EXPECT: Some feelings of bloating in the abdomen. Passage of more gas than usual.  Walking can help get rid of the air that was put into your GI tract during the procedure and reduce the bloating. If you had a lower endoscopy (such as a colonoscopy or flexible sigmoidoscopy) you may notice spotting of blood in your stool or on the toilet paper. If you underwent a bowel prep for your procedure, you may not have a normal bowel movement for a few days.  Please Note:  You might notice some irritation and congestion in your nose or some drainage.  This is from the oxygen used during your procedure.  There is no need for concern and it should clear up in a day or so.  SYMPTOMS TO REPORT IMMEDIATELY:   Following lower endoscopy (colonoscopy or flexible sigmoidoscopy):  Excessive amounts of blood in the stool  Significant tenderness or worsening of abdominal pains  Swelling of the abdomen that is new, acute  Fever of 100F or higher  For urgent or emergent issues, a gastroenterologist can be reached at any hour by calling [phone].   DIET:  We do recommend a small meal at first, but then you may proceed to your regular diet.  Drink plenty of fluids but you should avoid alcoholic beverages for 24 hours.  MEDICATIONS: Continue present medications.  Please see handouts given to you by your recovery nurse.  ACTIVITY:  You should plan to take it easy for the rest of today and you should NOT  DRIVE or use heavy machinery until tomorrow (because of the sedation medicines used during the test).    FOLLOW UP: Our staff will call the number listed on your records the next business day following your procedure to check on you and address any questions or concerns that you may have regarding the information given to you following your procedure. If we do not reach you, we will leave a message.  However, if you are feeling well and you are not experiencing any problems, there is no need to return our call.  We will assume that you have returned to your regular daily activities without incident.  If any biopsies were taken you will be contacted by phone or by letter within the next 1-3 weeks.  Please call us at [phone] if you have not heard about the biopsies in 3 weeks.   Thank you for allowing us to provide for your healthcare needs today.   SIGNATURES/CONFIDENTIALITY: You and/or your care partner have signed paperwork which will be entered into your electronic medical record.  These signatures attest to the fact that that the information above on your After Visit Summary has been reviewed and is understood.  Full responsibility of the confidentiality of this discharge information lies with you and/or your care-partner. 

## 2018-07-25 NOTE — Op Note (Signed)
[facility] Patient Name: Kelli MaudlinRejeana Contreras Procedure Date: 07/25/2018 2:26 PM MRN: [id number] Endoscopist: Wilhemina BonitoJohn N. Marina GoodellPerry , MD Age: [age] Referring MD:  Date of Birth: [birth year] Gender: Female Account #: [id number] Procedure:                Colonoscopy, With biopsies Indications:              Screening for colorectal malignant neoplasm,                            Incidental diarrhea noted. Negative index exam 2007 Medicines:                Monitored Anesthesia Care Procedure:                Pre-Anesthesia Assessment:                           - Prior to the procedure, a History and Physical                            was performed, and patient medications and                            allergies were reviewed. The patient's tolerance of                            previous anesthesia was also reviewed. The risks                            and benefits of the procedure and the sedation                            options and risks were discussed with the patient.                            All questions were answered, and informed consent                            was obtained. Prior Anticoagulants: The patient has                            taken no previous anticoagulant or antiplatelet                            agents. ASA Grade Assessment: II - A patient with                            mild systemic disease. After reviewing the risks                            and benefits, the patient was deemed in                            satisfactory condition to undergo the procedure.  After obtaining informed consent, the colonoscope                            was passed under direct vision. Throughout the                            procedure, the patient's blood pressure, pulse, and                            oxygen saturations were monitored continuously. The                            Colonoscope was introduced through the anus and   advanced to the the cecum, identified by                            appendiceal orifice and ileocecal valve. The                            terminal ileum, ileocecal valve, appendiceal                            orifice, and rectum were photographed. The quality                            of the bowel preparation was good. The colonoscopy                            was performed without difficulty. The patient                            tolerated the procedure well. The bowel preparation                            used was SUPREP. Scope In: 2:36:10 PM Scope Out: 2:51:35 PM Scope Withdrawal Time: 0 hours 12 minutes 4 seconds  Total Procedure Duration: 0 hours 15 minutes 25 seconds  Findings:                 The terminal ileum appeared normal.                           Internal hemorrhoids were found during retroflexion.                           The entire examined colon appeared normal on direct                            and retroflexion views. Biopsies for histology were                            taken with a cold forceps from the entire colon for                            evaluation of microscopic colitis. Complications:  No immediate complications. Estimated blood loss:                            None. Estimated Blood Loss:     Estimated blood loss: none. Impression:               - The examined portion of the ileum was normal.                           - Internal hemorrhoids.                           - The entire examined colon is normal on direct and                            retroflexion views. Recommendation:           - Repeat colonoscopy in 10 years for screening                            purposes.                           - Patient has a contact number available for                            emergencies. The signs and symptoms of potential                            delayed complications were discussed with the                            patient. Return to  normal activities tomorrow.                            Written discharge instructions were provided to the                            patient.                           - Resume previous diet.                           - Continue present medications.                           - Await pathology results. We will contact you with                            the results and further recommendations. Wilhemina Bonito. Marina Goodell, MD 07/25/2018 2:55:40 PM This report has been signed electronically.

## 2018-07-26 ENCOUNTER — Telehealth: Payer: Self-pay | Admitting: *Deleted

## 2018-07-26 NOTE — Telephone Encounter (Signed)
  Follow up Call-  Call back number 07/25/2018  Post procedure Call Back phone  # #[phone] cell  Permission to leave phone message Yes  Some recent data might be hidden     Patient questions:  Do you have a fever, pain , or abdominal swelling? No. Pain Score  0 *  Have you tolerated food without any problems? Yes.    Have you been able to return to your normal activities? Yes.    Do you have any questions about your discharge instructions: Diet   No. Medications  No. Follow up visit  No.  Do you have questions or concerns about your Care? No.  Actions: * If pain score is 4 or above: No action needed, pain <4.

## 2018-08-01 ENCOUNTER — Encounter: Payer: Self-pay | Admitting: Internal Medicine

## 2018-08-05 ENCOUNTER — Telehealth: Payer: Self-pay | Admitting: Internal Medicine

## 2018-08-05 NOTE — Telephone Encounter (Signed)
Pt needs something for diarrhea sent to [facility] on [address] In [place]. She stated that she spoke with a nurse early this week and was told that a medication was going to be sent to her pharmacy.

## 2018-08-12 NOTE — Telephone Encounter (Signed)
Spoke to pharmacy- Lomotil was received by pharmacy on 07/19/2018 and picked up by the patient.

## 2018-08-15 NOTE — Progress Notes (Signed)
Colon appeared normal. Rp in 10 yrs for screening

## 2018-09-05 ENCOUNTER — Telehealth: Payer: Self-pay | Admitting: Internal Medicine

## 2018-09-05 NOTE — Telephone Encounter (Signed)
Got vm, mailbox full.   According to the pharmacy they received Lomotil prescription (which is for diarrhea) last month and patient had already picked it up.

## 2018-09-06 ENCOUNTER — Ambulatory Visit: Payer: BC Managed Care – PPO | Admitting: Internal Medicine

## 2018-10-07 ENCOUNTER — Telehealth: Payer: Self-pay | Admitting: Family Medicine

## 2018-10-07 NOTE — Telephone Encounter (Signed)
MyChart message sent to patient about rescheduling their appt on 12/02/18 °

## 2018-12-02 ENCOUNTER — Encounter: Payer: BC Managed Care – PPO | Admitting: Family Medicine

## 2019-02-23 ENCOUNTER — Other Ambulatory Visit: Payer: Self-pay

## 2019-02-23 ENCOUNTER — Telehealth (INDEPENDENT_AMBULATORY_CARE_PROVIDER_SITE_OTHER): Payer: Medicare Other | Admitting: Family Medicine

## 2019-02-23 DIAGNOSIS — I10 Essential (primary) hypertension: Secondary | ICD-10-CM

## 2019-02-23 DIAGNOSIS — M2550 Pain in unspecified joint: Secondary | ICD-10-CM

## 2019-02-23 DIAGNOSIS — G8929 Other chronic pain: Secondary | ICD-10-CM

## 2019-02-23 DIAGNOSIS — G43709 Chronic migraine without aura, not intractable, without status migrainosus: Secondary | ICD-10-CM

## 2019-02-23 DIAGNOSIS — K580 Irritable bowel syndrome with diarrhea: Secondary | ICD-10-CM

## 2019-02-23 DIAGNOSIS — F418 Other specified anxiety disorders: Secondary | ICD-10-CM

## 2019-02-23 MED ORDER — HYDROCHLOROTHIAZIDE 25 MG PO TABS: 25.0000 mg | ORAL_TABLET | Freq: Every day | ORAL | 1 refills | Status: DC

## 2019-02-23 MED ORDER — IMIPRAMINE HCL 50 MG PO TABS: ORAL_TABLET | ORAL | 1 refills | Status: DC

## 2019-02-23 MED ORDER — TOPIRAMATE 100 MG PO TABS: 100.0000 mg | ORAL_TABLET | Freq: Two times a day (BID) | ORAL | 1 refills | Status: DC

## 2019-02-23 MED ORDER — LISINOPRIL 20 MG PO TABS: ORAL_TABLET | ORAL | 1 refills | Status: DC

## 2019-02-23 MED ORDER — METOPROLOL SUCCINATE ER 50 MG PO TB24: ORAL_TABLET | ORAL | 1 refills | Status: DC

## 2019-02-23 MED ORDER — DULOXETINE HCL 30 MG PO CPEP: 30.0000 mg | ORAL_CAPSULE | Freq: Every day | ORAL | 1 refills | Status: DC

## 2019-02-23 MED ORDER — METHOCARBAMOL 500 MG PO TABS: 500.0000 mg | ORAL_TABLET | Freq: Three times a day (TID) | ORAL | 0 refills | Status: DC | PRN

## 2019-02-23 NOTE — Patient Instructions (Signed)
Low-FODMAP Eating Plan    FODMAPs (fermentable oligosaccharides, disaccharides, monosaccharides, and polyols) are sugars that are hard for some people to digest. A low-FODMAP eating plan may help some people who have bowel (intestinal) diseases to manage their symptoms.  This meal plan can be complicated to follow. Work with a diet and nutrition specialist (dietitian) to make a low-FODMAP eating plan that is right for you. A dietitian can make sure that you get enough nutrition from this diet.  What are tips for following this plan?  Reading food labels   Check labels for hidden FODMAPs such as:  ? High-fructose syrup.  ? Honey.  ? Agave.  ? Natural fruit flavors.  ? Onion or garlic powder.   Choose low-FODMAP foods that contain 3-4 grams of fiber per serving.   Check food labels for serving sizes. Eat only one serving at a time to make sure FODMAP levels stay low.  Meal planning   Follow a low-FODMAP eating plan for up to 6 weeks, or as told by your health care provider or dietitian.   To follow the eating plan:  1. Eliminate high-FODMAP foods from your diet completely.  2. Gradually reintroduce high-FODMAP foods into your diet one at a time. Most people should wait a few days after introducing one high-FODMAP food before they introduce the next high-FODMAP food. Your dietitian can recommend how quickly you may reintroduce foods.  3. Keep a daily record of what you eat and drink, and make note of any symptoms that you have after eating.  4. Review your daily record with a dietitian regularly. Your dietitian can help you identify which foods you can eat and which foods you should avoid.  General tips   Drink enough fluid each day to keep your urine pale yellow.   Avoid processed foods. These often have added sugar and may be high in FODMAPs.   Avoid most dairy products, whole grains, and sweeteners.   Work with a dietitian to make sure you get enough fiber in your diet.  Recommended  foods  Grains   Gluten-free grains, such as rice, oats, buckwheat, quinoa, corn, polenta, and millet. Gluten-free pasta, bread, or cereal. Rice noodles. Corn tortillas.  Vegetables   Eggplant, zucchini, cucumber, peppers, green beans, Brussels sprouts, bean sprouts, lettuce, arugula, kale, Swiss chard, spinach, collard greens, bok choy, summer squash, potato, and tomato. Limited amounts of corn, carrot, and sweet potato. Green parts of scallions.  Fruits   Bananas, oranges, lemons, limes, blueberries, raspberries, strawberries, grapes, cantaloupe, honeydew melon, kiwi, papaya, passion fruit, and pineapple. Limited amounts of dried cranberries, banana chips, and shredded coconut.  Dairy   Lactose-free milk, yogurt, and kefir. Lactose-free cottage cheese and ice cream. Non-dairy milks, such as almond, coconut, hemp, and rice milk. Yogurts made of non-dairy milks. Limited amounts of goat cheese, brie, mozzarella, parmesan, swiss, and other hard cheeses.  Meats and other protein foods   Unseasoned beef, pork, poultry, or fish. Eggs. Bacon. Tofu (firm) and tempeh. Limited amounts of nuts and seeds, such as almonds, walnuts, brazil nuts, pecans, peanuts, pumpkin seeds, chia seeds, and sunflower seeds.  Fats and oils   Butter-free spreads. Vegetable oils, such as olive, canola, and sunflower oil.  Seasoning and other foods   Artificial sweeteners with names that do not end in "ol" such as aspartame, saccharine, and stevia. Maple syrup, white table sugar, raw sugar, brown sugar, and molasses. Fresh basil, coriander, parsley, rosemary, and thyme.  Beverages   Water and mineral water.   Sugar-sweetened soft drinks. Small amounts of orange juice or cranberry juice. Black and green tea. Most dry wines. Coffee.  This may not be a complete list of low-FODMAP foods. Talk with your dietitian for more information.  Foods to avoid  Grains   Wheat, including kamut, durum, and semolina. Barley and bulgur. Couscous. Wheat-based  cereals. Wheat noodles, bread, crackers, and pastries.  Vegetables   Chicory root, artichoke, asparagus, cabbage, snow peas, sugar snap peas, mushrooms, and cauliflower. Onions, garlic, leeks, and the white part of scallions.  Fruits   Fresh, dried, and juiced forms of apple, pear, watermelon, peach, plum, cherries, apricots, blackberries, boysenberries, figs, nectarines, and mango. Avocado.  Dairy   Milk, yogurt, ice cream, and soft cheese. Cream and sour cream. Milk-based sauces. Custard.  Meats and other protein foods   Fried or fatty meat. Sausage. Cashews and pistachios. Soybeans, baked beans, black beans, chickpeas, kidney beans, fava beans, navy beans, lentils, and split peas.  Seasoning and other foods   Any sugar-free gum or candy. Foods that contain artificial sweeteners such as sorbitol, mannitol, isomalt, or xylitol. Foods that contain honey, high-fructose corn syrup, or agave. Bouillon, vegetable stock, beef stock, and chicken stock. Garlic and onion powder. Condiments made with onion, such as hummus, chutney, pickles, relish, salad dressing, and salsa. Tomato paste.  Beverages   Chicory-based drinks. Coffee substitutes. Chamomile tea. Fennel tea. Sweet or fortified wines such as port or sherry. Diet soft drinks made with isomalt, mannitol, maltitol, sorbitol, or xylitol. Apple, pear, and mango juice. Juices with high-fructose corn syrup.  This may not be a complete list of high-FODMAP foods. Talk with your dietitian to discuss what dietary choices are best for you.   Summary   A low-FODMAP eating plan is a short-term diet that eliminates FODMAPs from your diet to help ease symptoms of certain bowel diseases.   The eating plan usually lasts up to 6 weeks. After that, high-FODMAP foods are restarted gradually, one at a time, so you can find out which may be causing symptoms.   A low-FODMAP eating plan can be complicated. It is best to work with a dietitian who has experience with this type of  plan.  This information is not intended to replace advice given to you by your health care provider. Make sure you discuss any questions you have with your health care provider.  Document Released: 06/22/2017 Document Revised: 06/22/2017 Document Reviewed: 06/22/2017  [employer] Interactive Patient Education  2019 [employer]

## 2019-02-23 NOTE — Progress Notes (Signed)
Virtual Visit via telephone Note  I connected with patient on 02/23/19 at 938am by telephone and verified that I am speaking with the correct person using two identifiers. Kelli Contreras is currently located at home and patient is currently with her during visit. The provider, Myles Lipps, MD is located in their office at time of visit.  I discussed the limitations, risks, security and privacy concerns of performing an evaluation and management service by telephone and the availability of in person appointments. I also discussed with the patient that there may be a patient responsible charge related to this service. The patient expressed understanding and agreed to proceed.   Telephone visit today for transfer of care, med refills  HPI Previous PCP - Dr Katrinka Blazing, last OV July 2019 Colonoscopy in sept 2019 with Dr Marina Goodell - int hemorrhoids otherwise normal  IBS with diarrhea - on lomotil, takes really when needed, in the past has had abd pain preceded diarrhea that resolves  HTN - checks BP occasional, reports normal readings, takes medications as prescribed  BP Readings from Last 3 Encounters:  07/25/18 118/68  07/19/18 108/62  05/30/18 112/72    Arthritis - knees, shoulder and neck, neg rheum labs, takes meloxicam prn, worse on rainy day, stable  Depression and anxiety - changed to Cymbalta from lexapro, feels that her mood is well controlled?  Migraines - stable on current regime. Usually responds to ibuprofen and laying down. Onions and chocolates are triggers.   Lab Results  Component Value Date   CREATININE 0.96 05/30/2018   BUN 19 05/30/2018   NA 145 (H) 05/30/2018   K 4.0 05/30/2018   CL 105 05/30/2018   CO2 26 05/30/2018    Fall Risk  02/23/2019 05/30/2018 07/13/2017 02/11/2017 01/01/2017  Falls in the past year? 0 No No No No  Number falls in past yr: 0 - - - -  Injury with Fall? 0 - - - -  Follow up Falls evaluation completed - - - -     Depression screen [facility] 2/9  02/23/2019 05/30/2018 07/13/2017  Decreased Interest 0 0 0  Down, Depressed, Hopeless 0 0 0  PHQ - 2 Score 0 0 0    Allergies  Allergen Reactions  . Other Other (See Comments)    Onions & Chocolate cause migraine    Prior to Admission medications   Medication Sig Start Date End Date Taking? Authorizing Provider  aspirin 81 MG tablet Take 81 mg by mouth at bedtime.    Yes [provider]  DULoxetine (CYMBALTA) 30 MG capsule Take 1 capsule (30 mg total) by mouth daily. 12/11/17  Yes Ethelda Chick, MD  hydrochlorothiazide (HYDRODIURIL) 25 MG tablet Take 1 tablet (25 mg total) by mouth daily. 05/30/18  Yes Ethelda Chick, MD  imipramine (TOFRANIL) 50 MG tablet TAKE 2 TABLETS (100 MG     TOTAL) AT BEDTIME 05/30/18  Yes Ethelda Chick, MD  lisinopril (PRINIVIL,ZESTRIL) 20 MG tablet TAKE 1 TABLET DAILY 05/30/18  Yes Ethelda Chick, MD  meloxicam (MOBIC) 15 MG tablet Take 1 tablet (15 mg total) by mouth daily. 05/30/18  Yes Ethelda Chick, MD  methocarbamol (ROBAXIN) 500 MG tablet Take 1 tablet (500 mg total) by mouth every 8 (eight) hours as needed for muscle spasms. 05/30/18  Yes Ethelda Chick, MD  metoprolol succinate (TOPROL-XL) 50 MG 24 hr tablet TAKE 1 TABLET DAILY WITH ORIMMEDIATELY FOLLOWING A  MEAL. 05/30/18  Yes Ethelda Chick, MD  potassium chloride SA (KLOR-CON M20) 20 MEQ tablet Take 1 tablet (20 mEq total) by mouth daily. 05/30/18  Yes Ethelda ChickSmith, Kristi M, MD  rizatriptan (MAXALT-MLT) 10 MG disintegrating tablet Take 1 tablet (10 mg total) by mouth as needed for migraine. May repeat in 2 hours if needed 05/30/18  Yes Ethelda ChickSmith, Kristi M, MD  topiramate (TOPAMAX) 100 MG tablet Take 1 tablet (100 mg total) by mouth 2 (two) times daily. 05/30/18  Yes Ethelda ChickSmith, Kristi M, MD  zolpidem (AMBIEN) 5 MG tablet Take 1 tablet (5 mg total) by mouth at bedtime as needed. 12/11/17  Yes Ethelda ChickSmith, Kristi M, MD  colestipol (COLESTID) 5 g packet I pack 05/30/18   [provider]  diphenoxylate-atropine  (LOMOTIL) 2.5-0.025 MG tablet Take 1 tablet by mouth 2 (two) times daily as needed for diarrhea or loose stools. Patient not taking: Reported on 07/25/2018 07/19/18   Meredith PelGuenther, Paula M, NP  hydrocortisone (ANUSOL-HC) 2.5 % rectal cream Place 1 application rectally at bedtime. Use for one week Patient not taking: Reported on 02/23/2019 07/19/18   Meredith PelGuenther, Paula M, NP  pantoprazole (PROTONIX) 40 MG tablet Take 1 tablet (40 mg total) by mouth daily. Patient not taking: Reported on 02/23/2019 12/11/17   Ethelda ChickSmith, Kristi M, MD    Past Medical History:  Diagnosis Date  . Cataract    bil cataracts removed  . Depression   . Diarrhea   . Hypertension   . IBS (irritable bowel syndrome)    diarrhea  . Insomnia   . Migraine   . Plantar fasciitis     Past Surgical History:  Procedure Laterality Date  . ABDOMINAL HYSTERECTOMY     DUB; ovaries intact.  Marland Kitchen. BREAST CYST ASPIRATION Bilateral   . BUNIONECTOMY  2007   Dr. Lestine BoxBednarz  . VESICOVAGINAL FISTULA CLOSURE W/ TAH  1985   Dr. Nicholas LoseLomax    Social History   Tobacco Use  . Smoking status: Never Smoker  . Smokeless tobacco: Never Used  Substance Use Topics  . Alcohol use: No    Family History  Problem Relation Age of Onset  . CAD Father 1660  . Cancer Father        bladder cancer  . Diabetes Father   . Heart disease Father   . Hyperlipidemia Father   . Hypertension Father   . Valvular heart disease Mother        MVP  . Heart disease Mother   . Hyperlipidemia Mother   . Hypertension Mother   . Hypertension Brother   . Hypertension Unknown        mother, father, brother  . Breast cancer Maternal Aunt   . Colon cancer Neg Hx   . Esophageal cancer Neg Hx   . Rectal cancer Neg Hx   . Stomach cancer Neg Hx     ROS Per hpi  Objective  Vitals as reported by the patient: none  There were no vitals filed for this visit.  ASSESSMENT and PLAN  1. Essential hypertension 2. Chronic migraine without aura without status migrainosus, not  intractable 3. Chronic pain of multiple joints 4. Irritable bowel syndrome with diarrhea 5. Depression with anxiety  PMH, PSH, meds, allergies, Fhx, Shx, reviewed with patient today. Chronic medical conditions are stable. Medications refilled. Discussed Low [place] diet with patient. Educational handout provided via [employer].   Other orders - DULoxetine (CYMBALTA) 30 MG capsule; Take 1 capsule (30 mg total) by mouth daily. - hydrochlorothiazide (HYDRODIURIL) 25 MG tablet; Take 1 tablet (25  mg total) by mouth daily. - imipramine (TOFRANIL) 50 MG tablet; TAKE 2 TABLETS (100 MG     TOTAL) AT BEDTIME - lisinopril (PRINIVIL,ZESTRIL) 20 MG tablet; TAKE 1 TABLET DAILY - methocarbamol (ROBAXIN) 500 MG tablet; Take 1 tablet (500 mg total) by mouth every 8 (eight) hours as needed for muscle spasms. - metoprolol succinate (TOPROL-XL) 50 MG 24 hr tablet; TAKE 1 TABLET DAILY WITH ORIMMEDIATELY FOLLOWING A  MEAL. - topiramate (TOPAMAX) 100 MG tablet; Take 1 tablet (100 mg total) by mouth 2 (two) times daily.  FOLLOW-UP: 6 months   The above assessment and management plan was discussed with the patient. The patient verbalized understanding of and has agreed to the management plan. Patient is aware to call the clinic if symptoms persist or worsen. Patient is aware when to return to the clinic for a follow-up visit. Patient educated on when it is appropriate to go to the emergency department.    I provided 14 minutes of non-face-to-face time during this encounter.  Myles Lipps, MD Primary Care at [facility] [address] [place], [place] [address] Ph.  [phone] Fax [phone]

## 2019-02-23 NOTE — Progress Notes (Signed)
Chief Complaint :  T.O.C. and Medication Refills pending. Pt states that she has been having some loose stools X 1.5 weeks and hemorrhoids issue.

## 2019-05-15 ENCOUNTER — Telehealth: Payer: Self-pay | Admitting: Internal Medicine

## 2019-05-15 NOTE — Telephone Encounter (Signed)
Pt needs something for nausea, she has been vomiting and is out of town at this moment. She is requesting if Dr. Henrene Pastor can send a prescription to [facility] in [place], [place].

## 2019-05-16 MED ORDER — ONDANSETRON HCL 4 MG PO TABS: 4.0000 mg | ORAL_TABLET | ORAL | 0 refills | Status: DC | PRN

## 2019-05-16 NOTE — Telephone Encounter (Signed)
Please advise.  Last office visit 07/20/2019; colonoscopy 07/26/19

## 2019-05-16 NOTE — Telephone Encounter (Signed)
As she does not receive continuity care through this office, it is best that she go through her PCP.  However, I am willing to prescribe Zofran this one time.  Zofran 4 mg; #10; 1 by mouth every 4 hours as needed for nausea.  No refills.  For persistent problems, should talk to her PCP or schedule an appointment with this office.  Thanks

## 2019-05-16 NOTE — Telephone Encounter (Signed)
Sent Zofran per Dr. Henrene Pastor

## 2019-06-24 ENCOUNTER — Other Ambulatory Visit: Payer: Self-pay | Admitting: Family Medicine

## 2019-06-26 NOTE — Telephone Encounter (Signed)
Requested medications are due for refill today?  Yes  Requested medications are on the active medication list?  Yes  Last refill - 02/23/2019 #180, 1 Refill  Future visit scheduled? No  Notes to clinic   Requested Prescriptions  Pending Prescriptions Disp Refills   topiramate (TOPAMAX) 100 MG tablet [Pharmacy Med Name: TOPIRAMATE  100MG   TAB] 180 tablet 3    Sig: TAKE 1 TABLET BY MOUTH  TWICE DAILY     Not Delegated - Neurology: Anticonvulsants - topiramate & zonisamide Failed - 06/24/2019  5:53 AM      Failed - This refill cannot be delegated      Failed - Cr in normal range and within 360 days    Creat  Date Value Ref Range Status  05/16/2016 0.87 0.50 - 0.99 mg/dL Final    Comment:      For patients > or = 66 years of age: The upper reference limit for Creatinine is approximately 13% higher for people identified as African-American.      Creatinine, Ser  Date Value Ref Range Status  05/30/2018 0.96 0.57 - 1.00 mg/dL Final         Failed - CO2 in normal range and within 360 days    CO2  Date Value Ref Range Status  05/30/2018 26 20 - 29 mmol/L Final         Failed - Valid encounter within last 12 months    Recent Outpatient Visits          1 year ago Essential hypertension   Primary Care at [facility], Renette Butters, MD   1 year ago Essential hypertension   Primary Care at [facility], Renette Butters, MD   1 year ago Other fatigue   Primary Care at [facility], Renette Butters, MD   2 years ago Essential hypertension   Primary Care at [facility], Renette Butters, MD   2 years ago Cough   Primary Care at [place], [place]             Signed Prescriptions Disp Refills   DULoxetine (CYMBALTA) 30 MG capsule 90 capsule 0    Sig: TAKE 1 CAPSULE BY MOUTH  DAILY     Psychiatry: Antidepressants - SNRI Failed - 06/24/2019  5:53 AM      Failed - Valid encounter within last 6 months    Recent Outpatient Visits          1 year ago Essential hypertension   Primary Care at  [facility], Renette Butters, MD   1 year ago Essential hypertension   Primary Care at [facility], Renette Butters, MD   1 year ago Other fatigue   Primary Care at [facility], Renette Butters, MD   2 years ago Essential hypertension   Primary Care at [facility], Renette Butters, MD   2 years ago Cough   Primary Care at [place], PA-C             Failed - Completed PHQ-2 or PHQ-9 in the last 360 days.      Passed - Last BP in normal range    BP Readings from Last 1 Encounters:  07/25/18 118/68          hydrochlorothiazide (HYDRODIURIL) 25 MG tablet 90 tablet 0    Sig: TAKE 1 TABLET BY MOUTH  DAILY     Cardiovascular: Diuretics - Thiazide Failed - 06/24/2019  5:53 AM      Failed - Ca  in normal range and within 360 days    Calcium  Date Value Ref Range Status  05/30/2018 9.0 8.7 - 10.3 mg/dL Final         Failed - Cr in normal range and within 360 days    Creat  Date Value Ref Range Status  05/16/2016 0.87 0.50 - 0.99 mg/dL Final    Comment:      For patients > or = 66 years of age: The upper reference limit for Creatinine is approximately 13% higher for people identified as African-American.      Creatinine, Ser  Date Value Ref Range Status  05/30/2018 0.96 0.57 - 1.00 mg/dL Final         Failed - K in normal range and within 360 days    Potassium  Date Value Ref Range Status  05/30/2018 4.0 3.5 - 5.2 mmol/L Final         Failed - Na in normal range and within 360 days    Sodium  Date Value Ref Range Status  05/30/2018 145 (H) 134 - 144 mmol/L Final         Failed - Valid encounter within last 6 months    Recent Outpatient Visits          1 year ago Essential hypertension   [facility], Myrle ShengKristi M, MD   1 year ago Essential hypertension   [facility], Myrle ShengKristi M, MD   1 year ago Other fatigue   [facility], Myrle ShengKristi M, MD   2 years ago Essential hypertension   [facility], Myrle ShengKristi M, MD   2 years  ago Cough   [facility], [place]             Passed - Last BP in normal range    BP Readings from Last 1 Encounters:  07/25/18 118/68          metoprolol succinate (TOPROL-XL) 50 MG 24 hr tablet 90 tablet 0    Sig: TAKE 1 TABLET BY MOUTH  DAILY WITH OR IMMEDIATELY  FOLLOWING A MEAL     Cardiovascular:  Beta Blockers Failed - 06/24/2019  5:53 AM      Failed - Valid encounter within last 6 months    Recent Outpatient Visits          1 year ago Essential hypertension   [facility], Myrle ShengKristi M, MD   1 year ago Essential hypertension   [facility], Myrle ShengKristi M, MD   1 year ago Other fatigue   [facility], Myrle ShengKristi M, MD   2 years ago Essential hypertension   [facility], Myrle ShengKristi M, MD   2 years ago Cough   [facility], [place]             Passed - Last BP in normal range    BP Readings from Last 1 Encounters:  07/25/18 118/68         Passed - Last Heart Rate in normal range    Pulse Readings from Last 1 Encounters:  07/25/18 70          lisinopril (ZESTRIL) 20 MG tablet 90 tablet 0    Sig: TAKE 1 TABLET BY MOUTH  DAILY     Cardiovascular:  ACE Inhibitors Failed - 06/24/2019  5:53 AM      Failed -  Cr in normal range and within 180 days    Creat  Date Value Ref Range Status  05/16/2016 0.87 0.50 - 0.99 mg/dL Final    Comment:      For patients > or = 66 years of age: The upper reference limit for Creatinine is approximately 13% higher for people identified as African-American.      Creatinine, Ser  Date Value Ref Range Status  05/30/2018 0.96 0.57 - 1.00 mg/dL Final         Failed - K in normal range and within 180 days    Potassium  Date Value Ref Range Status  05/30/2018 4.0 3.5 - 5.2 mmol/L Final         Failed - Valid encounter within last 6 months    Recent Outpatient Visits          1 year ago Essential hypertension   [facility], Myrle ShengKristi M, MD   1 year ago Essential hypertension   [facility], Myrle ShengKristi M, MD   1 year ago Other fatigue   [facility], Myrle ShengKristi M, MD   2 years ago Essential hypertension   [facility], Myrle ShengKristi M, MD   2 years ago Cough   [facility], [place]             Passed - Patient is not pregnant      Passed - Last BP in normal range    BP Readings from Last 1 Encounters:  07/25/18 118/68

## 2019-06-26 NOTE — Telephone Encounter (Signed)
Requested Prescriptions  Pending Prescriptions Disp Refills  . hydrochlorothiazide (HYDRODIURIL) 25 MG tablet [Pharmacy Med Name: HYDROCHLOROTHIAZIDE  25MG   TAB] 90 tablet 0    Sig: TAKE 1 TABLET BY MOUTH  DAILY     Cardiovascular: Diuretics - Thiazide Failed - 06/24/2019  5:53 AM      Failed - Ca in normal range and within 360 days    Calcium  Date Value Ref Range Status  05/30/2018 9.0 8.7 - 10.3 mg/dL Final         Failed - Cr in normal range and within 360 days    Creat  Date Value Ref Range Status  05/16/2016 0.87 0.50 - 0.99 mg/dL Final    Comment:      For patients > or = 66 years of age: The upper reference limit for Creatinine is approximately 13% higher for people identified as African-American.      Creatinine, Ser  Date Value Ref Range Status  05/30/2018 0.96 0.57 - 1.00 mg/dL Final         Failed - K in normal range and within 360 days    Potassium  Date Value Ref Range Status  05/30/2018 4.0 3.5 - 5.2 mmol/L Final         Failed - Na in normal range and within 360 days    Sodium  Date Value Ref Range Status  05/30/2018 145 (H) 134 - 144 mmol/L Final         Failed - Valid encounter within last 6 months    Recent Outpatient Visits          1 year ago Essential hypertension   [facility], Myrle ShengKristi M, MD   1 year ago Essential hypertension   [facility], Myrle ShengKristi M, MD   1 year ago Other fatigue   [facility], Myrle ShengKristi M, MD   2 years ago Essential hypertension   [facility], Myrle ShengKristi M, MD   2 years ago Cough   [facility], [place], [place]             Passed - Last BP in normal range    BP Readings from Last 1 Encounters:  07/25/18 118/68         . metoprolol succinate (TOPROL-XL) 50 MG 24 hr tablet [Pharmacy Med Name: METOPROLOL SUCC ER 50MG  TABLET] 90 tablet 0    Sig: TAKE 1 TABLET BY MOUTH  DAILY WITH OR IMMEDIATELY  FOLLOWING A MEAL     Cardiovascular:  Beta  Blockers Failed - 06/24/2019  5:53 AM      Failed - Valid encounter within last 6 months    Recent Outpatient Visits          1 year ago Essential hypertension   [facility], Myrle ShengKristi M, MD   1 year ago Essential hypertension   [facility], Myrle ShengKristi M, MD   1 year ago Other fatigue   [facility], Myrle ShengKristi M, MD   2 years ago Essential hypertension   [facility], Myrle ShengKristi M, MD   2 years ago Cough   [facility], [place], [place]             Passed - Last BP in normal range    BP Readings from Last 1 Encounters:  07/25/18 118/68         Passed -  Last Heart Rate in normal range    Pulse Readings from Last 1 Encounters:  07/25/18 70         . lisinopril (ZESTRIL) 20 MG tablet [Pharmacy Med Name: LISINOPRIL  20MG   TAB] 90 tablet 0    Sig: TAKE 1 TABLET BY MOUTH  DAILY     Cardiovascular:  ACE Inhibitors Failed - 06/24/2019  5:53 AM      Failed - Cr in normal range and within 180 days    Creat  Date Value Ref Range Status  05/16/2016 0.87 0.50 - 0.99 mg/dL Final    Comment:      For patients > or = 66 years of age: The upper reference limit for Creatinine is approximately 13% higher for people identified as African-American.      Creatinine, Ser  Date Value Ref Range Status  05/30/2018 0.96 0.57 - 1.00 mg/dL Final         Failed - K in normal range and within 180 days    Potassium  Date Value Ref Range Status  05/30/2018 4.0 3.5 - 5.2 mmol/L Final         Failed - Valid encounter within last 6 months    Recent Outpatient Visits          1 year ago Essential hypertension   Primary Care at [facility], Renette Butters, MD   1 year ago Essential hypertension   Primary Care at [facility], Renette Butters, MD   1 year ago Other fatigue   Primary Care at [facility], Renette Butters, MD   2 years ago Essential hypertension   Primary Care at [facility], Renette Butters, MD   2 years ago Cough   Primary Care at  [facility] [place] [place] - Patient is not pregnant      Passed - Last BP in normal range    BP Readings from Last 1 Encounters:  07/25/18 118/68         . topiramate (TOPAMAX) 100 MG tablet [Pharmacy Med Name: TOPIRAMATE  100MG   TAB] 180 tablet 3    Sig: TAKE 1 TABLET BY MOUTH  TWICE DAILY     Not Delegated - Neurology: Anticonvulsants - topiramate & zonisamide Failed - 06/24/2019  5:53 AM      Failed - This refill cannot be delegated      Failed - Cr in normal range and within 360 days    Creat  Date Value Ref Range Status  05/16/2016 0.87 0.50 - 0.99 mg/dL Final    Comment:      For patients > or = 66 years of age: The upper reference limit for Creatinine is approximately 13% higher for people identified as African-American.      Creatinine, Ser  Date Value Ref Range Status  05/30/2018 0.96 0.57 - 1.00 mg/dL Final         Failed - CO2 in normal range and within 360 days    CO2  Date Value Ref Range Status  05/30/2018 26 20 - 29 mmol/L Final         Failed - Valid encounter within last 12 months    Recent Outpatient Visits          1 year ago Essential hypertension   Primary Care at [facility], Renette Butters, MD   1 year ago Essential hypertension   Primary Care at [facility], Renette Butters, MD  1 year ago Other fatigue   [facility], Myrle ShengKristi M, MD   2 years ago Essential hypertension   [facility], Myrle ShengKristi M, MD   2 years ago Cough   [facility], [place]             Signed Prescriptions Disp Refills   DULoxetine (CYMBALTA) 30 MG capsule 90 capsule 0    Sig: TAKE 1 CAPSULE BY MOUTH  DAILY     Psychiatry: Antidepressants - SNRI Failed - 06/24/2019  5:53 AM      Failed - Valid encounter within last 6 months    Recent Outpatient Visits          1 year ago Essential hypertension   [facility], Myrle ShengKristi M, MD   1 year ago Essential hypertension   [facility], Myrle ShengKristi M, MD   1 year ago Other fatigue   [facility], Myrle ShengKristi M, MD   2 years ago Essential hypertension   [facility], Myrle ShengKristi M, MD   2 years ago Cough   [facility], [place]             Failed - Completed PHQ-2 or PHQ-9 in the last 360 days.      Passed - Last BP in normal range    BP Readings from Last 1 Encounters:  07/25/18 118/68

## 2019-06-26 NOTE — Telephone Encounter (Signed)
Requested Prescriptions  Pending Prescriptions Disp Refills  . DULoxetine (CYMBALTA) 30 MG capsule [Pharmacy Med Name: DULOXETINE  30MG   CAP] 90 capsule 0    Sig: TAKE 1 CAPSULE BY MOUTH  DAILY     Psychiatry: Antidepressants - SNRI Failed - 06/24/2019  5:53 AM      Failed - Valid encounter within last 6 months    Recent Outpatient Visits          1 year ago Essential hypertension   [facility], Myrle ShengKristi M, MD   1 year ago Essential hypertension   [facility], Myrle ShengKristi M, MD   1 year ago Other fatigue   [facility], Myrle ShengKristi M, MD   2 years ago Essential hypertension   [facility], Myrle ShengKristi M, MD   2 years ago Cough   [facility], [place]             Failed - Completed PHQ-2 or PHQ-9 in the last 360 days.      Passed - Last BP in normal range    BP Readings from Last 1 Encounters:  07/25/18 118/68         . hydrochlorothiazide (HYDRODIURIL) 25 MG tablet [Pharmacy Med Name: HYDROCHLOROTHIAZIDE  25MG   TAB] 90 tablet 0    Sig: TAKE 1 TABLET BY MOUTH  DAILY     Cardiovascular: Diuretics - Thiazide Failed - 06/24/2019  5:53 AM      Failed - Ca in normal range and within 360 days    Calcium  Date Value Ref Range Status  05/30/2018 9.0 8.7 - 10.3 mg/dL Final         Failed - Cr in normal range and within 360 days    Creat  Date Value Ref Range Status  05/16/2016 0.87 0.50 - 0.99 mg/dL Final    Comment:      For patients > or = 66 years of age: The upper reference limit for Creatinine is approximately 13% higher for people identified as African-American.      Creatinine, Ser  Date Value Ref Range Status  05/30/2018 0.96 0.57 - 1.00 mg/dL Final         Failed - K in normal range and within 360 days    Potassium  Date Value Ref Range Status  05/30/2018 4.0 3.5 - 5.2 mmol/L Final         Failed - Na in normal range and within 360 days    Sodium  Date Value Ref Range Status   05/30/2018 145 (H) 134 - 144 mmol/L Final         Failed - Valid encounter within last 6 months    Recent Outpatient Visits          1 year ago Essential hypertension   [facility], Myrle ShengKristi M, MD   1 year ago Essential hypertension   [facility], Myrle ShengKristi M, MD   1 year ago Other fatigue   [facility], Myrle ShengKristi M, MD   2 years ago Essential hypertension   [facility], Myrle ShengKristi M, MD   2 years ago Cough   [facility], [place]             Passed - Last BP in normal range    BP Readings from Last 1 Encounters:  07/25/18 118/68         .  metoprolol succinate (TOPROL-XL) 50 MG 24 hr tablet [Pharmacy Med Name: METOPROLOL SUCC ER 50MG  TABLET] 90 tablet 0    Sig: TAKE 1 TABLET BY MOUTH  DAILY WITH OR IMMEDIATELY  FOLLOWING A MEAL     Cardiovascular:  Beta Blockers Failed - 06/24/2019  5:53 AM      Failed - Valid encounter within last 6 months    Recent Outpatient Visits          1 year ago Essential hypertension   [facility], Myrle ShengKristi M, MD   1 year ago Essential hypertension   [facility], Myrle ShengKristi M, MD   1 year ago Other fatigue   [facility], Myrle ShengKristi M, MD   2 years ago Essential hypertension   [facility], Myrle ShengKristi M, MD   2 years ago Cough   [facility], [place]             Passed - Last BP in normal range    BP Readings from Last 1 Encounters:  07/25/18 118/68         Passed - Last Heart Rate in normal range    Pulse Readings from Last 1 Encounters:  07/25/18 70         . lisinopril (ZESTRIL) 20 MG tablet [Pharmacy Med Name: LISINOPRIL  20MG   TAB] 90 tablet 0    Sig: TAKE 1 TABLET BY MOUTH  DAILY     Cardiovascular:  ACE Inhibitors Failed - 06/24/2019  5:53 AM      Failed - Cr in normal range and within 180 days    Creat  Date Value Ref Range Status  05/16/2016 0.87 0.50 - 0.99 mg/dL Final     Comment:      For patients > or = 66 years of age: The upper reference limit for Creatinine is approximately 13% higher for people identified as African-American.      Creatinine, Ser  Date Value Ref Range Status  05/30/2018 0.96 0.57 - 1.00 mg/dL Final         Failed - K in normal range and within 180 days    Potassium  Date Value Ref Range Status  05/30/2018 4.0 3.5 - 5.2 mmol/L Final         Failed - Valid encounter within last 6 months    Recent Outpatient Visits          1 year ago Essential hypertension   [facility] Myrle ShengKristi M, MD   1 year ago Essential hypertension   [facility], Myrle ShengKristi M, MD   1 year ago Other fatigue   [facility], Myrle ShengKristi M, MD   2 years ago Essential hypertension   [facility], Myrle ShengKristi M, MD   2 years ago Cough   [facility], [place]             Passed - Patient is not pregnant      Passed - Last BP in normal range    BP Readings from Last 1 Encounters:  07/25/18 118/68         . topiramate (TOPAMAX) 100 MG tablet [Pharmacy Med Name: TOPIRAMATE  100MG   TAB] 180 tablet 3    Sig: TAKE 1 TABLET BY MOUTH  TWICE DAILY     Not Delegated - Neurology: Anticonvulsants - topiramate & zonisamide Failed - 06/24/2019  5:53 AM      Failed - This refill cannot be delegated      Failed - Cr in normal range and within 360 days    Creat  Date Value Ref Range Status  05/16/2016 0.87 0.50 - 0.99 mg/dL Final    Comment:      For patients > or = 66 years of age: The upper reference limit for Creatinine is approximately 13% higher for people identified as African-American.      Creatinine, Ser  Date Value Ref Range Status  05/30/2018 0.96 0.57 - 1.00 mg/dL Final         Failed - CO2 in normal range and within 360 days    CO2  Date Value Ref Range Status  05/30/2018 26 20 - 29 mmol/L Final         Failed - Valid encounter within last 12 months     Recent Outpatient Visits          1 year ago Essential hypertension   Primary Care at [facility], Renette Butters, MD   1 year ago Essential hypertension   [facility] at [facility], Renette Butters, MD   1 year ago Other fatigue   Primary Care at [facility], Renette Butters, MD   2 years ago Essential hypertension   Primary Care at [facility], Renette Butters, MD   2 years ago Cough   Primary Care at [place], [place]

## 2019-06-28 NOTE — Telephone Encounter (Signed)
Refilled medication, pt needs an OV for additional refills.

## 2019-07-13 NOTE — Telephone Encounter (Signed)
Scheduled appt.

## 2019-07-25 ENCOUNTER — Other Ambulatory Visit: Payer: Self-pay

## 2019-07-25 ENCOUNTER — Encounter: Payer: Self-pay | Admitting: Family Medicine

## 2019-07-25 ENCOUNTER — Ambulatory Visit (INDEPENDENT_AMBULATORY_CARE_PROVIDER_SITE_OTHER): Payer: Medicare Other | Admitting: Family Medicine

## 2019-07-25 VITALS — BP 131/85 | HR 75 | Temp 98.7°F | Ht 62.0 in | Wt 174.0 lb

## 2019-07-25 DIAGNOSIS — R1115 Cyclical vomiting syndrome unrelated to migraine: Secondary | ICD-10-CM | POA: Diagnosis not present

## 2019-07-25 DIAGNOSIS — I10 Essential (primary) hypertension: Secondary | ICD-10-CM | POA: Diagnosis not present

## 2019-07-25 DIAGNOSIS — Z8639 Personal history of other endocrine, nutritional and metabolic disease: Secondary | ICD-10-CM | POA: Diagnosis not present

## 2019-07-25 MED ORDER — POTASSIUM CHLORIDE CRYS ER 20 MEQ PO TBCR: 20.0000 meq | EXTENDED_RELEASE_TABLET | Freq: Every day | ORAL | 1 refills | Status: DC

## 2019-07-25 MED ORDER — METHOCARBAMOL 500 MG PO TABS: 500.0000 mg | ORAL_TABLET | Freq: Three times a day (TID) | ORAL | 2 refills | Status: DC | PRN

## 2019-07-25 NOTE — Progress Notes (Signed)
Pt came in for medication refill. She says she has been having vomiting for the past 6 month, not daily but often and this happens no matter what she eats, Due to this pt appt is now telemed.

## 2019-07-25 NOTE — Progress Notes (Signed)
Virtual Visit Note  I connected with patiet on 07/25/19 at 1143am by phone and verified that I am speaking with the correct person using two identifiers. Kelli Contreras is currently located at home and patient is currently with them during visit. The provider, Rutherford Guys, MD is located in their office at time of visit.  I discussed the limitations, risks, security and privacy concerns of performing an evaluation and management service by telephone and the availability of in person appointments. I also discussed with the patient that there may be a patient responsible charge related to this service. The patient expressed understanding and agreed to proceed.   CC: routine followup  HPI ? Last OV April 2020 - telemedicine  PMH: IBS with diarrhea, HTN, arthritis, depression and anxiety, migraines  Overall doing well Continues to take intermittent vomiting Negative Abd [place] in March 2019 Has never had EGD or other workup zofran not helping Taking tums does not help In the past protonix 40mg  does not help either Denies any abd pain or reflux associated with vomiting Vomiting mostly happens at night, looks mostly as undigested food, denies any blood Reports early satiety No weight loss Denies any dysphagia or bloating Normal colonoscopy in 2019  Wt Readings from Last 3 Encounters:  07/25/19 174 lb (78.9 kg)  07/25/18 160 lb (72.6 kg)  07/19/18 160 lb 3.2 oz (72.7 kg)   BP Readings from Last 3 Encounters:  07/25/19 131/85  07/25/18 118/68  07/19/18 108/62  takes HCTZ, lisinopril, metoprolol XL H/o hypokalemia so also takes KCL daily Needs refill of KCl  Allergies  Allergen Reactions  . Other Other (See Comments)    Onions & Chocolate cause migraine    Prior to Admission medications   Medication Sig Start Date End Date Taking? Authorizing Provider  aspirin 81 MG tablet Take 81 mg by mouth at bedtime.     [provider]  diphenoxylate-atropine (LOMOTIL)  2.5-0.025 MG tablet Take 1 tablet by mouth 2 (two) times daily as needed for diarrhea or loose stools. Patient not taking: Reported on 07/25/2018 07/19/18   Willia Craze, NP  DULoxetine (CYMBALTA) 30 MG capsule TAKE 1 CAPSULE BY MOUTH  DAILY 06/26/19   Rutherford Guys, MD  hydrochlorothiazide (HYDRODIURIL) 25 MG tablet TAKE 1 TABLET BY MOUTH  DAILY 06/26/19   Rutherford Guys, MD  imipramine (TOFRANIL) 50 MG tablet TAKE 2 TABLETS (100 MG     TOTAL) AT BEDTIME 02/23/19   Rutherford Guys, MD  lisinopril (ZESTRIL) 20 MG tablet TAKE 1 TABLET BY MOUTH  DAILY 06/26/19   Rutherford Guys, MD  meloxicam (MOBIC) 15 MG tablet Take 1 tablet (15 mg total) by mouth daily. 05/30/18   Wardell Honour, MD  methocarbamol (ROBAXIN) 500 MG tablet Take 1 tablet (500 mg total) by mouth every 8 (eight) hours as needed for muscle spasms. 02/23/19   Rutherford Guys, MD  metoprolol succinate (TOPROL-XL) 50 MG 24 hr tablet TAKE 1 TABLET BY MOUTH  DAILY WITH OR IMMEDIATELY  FOLLOWING A MEAL 06/26/19   Rutherford Guys, MD  ondansetron (ZOFRAN) 4 MG tablet Take 1 tablet (4 mg total) by mouth every 4 (four) hours as needed for nausea or vomiting. Needs to get this from PCP or schedule appointment with our office 05/16/19   Irene Shipper, MD  potassium chloride SA (KLOR-CON M20) 20 MEQ tablet Take 1 tablet (20 mEq total) by mouth daily. 05/30/18   Wardell Honour, MD  rizatriptan (MAXALT-MLT) 10 MG disintegrating tablet Take 1 tablet (10 mg total) by mouth as needed for migraine. May repeat in 2 hours if needed 05/30/18   Ethelda ChickSmith, Kristi M, MD  topiramate (TOPAMAX) 100 MG tablet TAKE 1 TABLET BY MOUTH  TWICE DAILY 06/28/19   Collie SiadStallings, Zoe A, MD  zolpidem (AMBIEN) 5 MG tablet Take 1 tablet (5 mg total) by mouth at bedtime as needed. 12/11/17   Ethelda ChickSmith, Kristi M, MD    Past Medical History:  Diagnosis Date  . Cataract    bil cataracts removed  . Depression   . Diarrhea   . Hypertension   . IBS (irritable bowel syndrome)    diarrhea   . Insomnia   . Migraine   . Plantar fasciitis     Past Surgical History:  Procedure Laterality Date  . ABDOMINAL HYSTERECTOMY     DUB; ovaries intact.  Marland Kitchen. BREAST CYST ASPIRATION Bilateral   . BUNIONECTOMY  2007   Dr. Lestine BoxBednarz  . VESICOVAGINAL FISTULA CLOSURE W/ TAH  1985   Dr. Nicholas LoseLomax    Social History   Tobacco Use  . Smoking status: Never Smoker  . Smokeless tobacco: Never Used  Substance Use Topics  . Alcohol use: No    Family History  Problem Relation Age of Onset  . CAD Father 160  . Cancer Father        bladder cancer  . Diabetes Father   . Heart disease Father   . Hyperlipidemia Father   . Hypertension Father   . Valvular heart disease Mother        MVP  . Heart disease Mother   . Hyperlipidemia Mother   . Hypertension Mother   . Hypertension Brother   . Hypertension Unknown        mother, father, brother  . Breast cancer Maternal Aunt   . Colon cancer Neg Hx   . Esophageal cancer Neg Hx   . Rectal cancer Neg Hx   . Stomach cancer Neg Hx     ROS Per hpi  Objective  Vitals as reported by the patient: as above   ASSESSMENT and PLAN  1. Persistent recurrent vomiting Has never been worked up, not responsive to GERD treatment, h/o suggestive of gastroparesis.  - Ambulatory referral to Gastroenterology  2. Essential hypertension 3. H/O hypokalemia Controlled. Continue current regime.   Other orders - methocarbamol (ROBAXIN) 500 MG tablet; Take 1 tablet (500 mg total) by mouth every 8 (eight) hours as needed for muscle spasms. - potassium chloride SA (KLOR-CON M20) 20 MEQ tablet; Take 1 tablet (20 mEq total) by mouth daily.  FOLLOW-UP: 3 months   The above assessment and management plan was discussed with the patient. The patient verbalized understanding of and has agreed to the management plan. Patient is aware to call the clinic if symptoms persist or worsen. Patient is aware when to return to the clinic for a follow-up visit. Patient educated on  when it is appropriate to go to the emergency department.    I provided 12 minutes of non-face-to-face time during this encounter.  Myles LippsIrma M Santiago, MD [facility] [address] [place], [place] [address] Ph.  [phone] Fax [phone]

## 2019-08-15 ENCOUNTER — Ambulatory Visit: Payer: Medicare Other | Admitting: Internal Medicine

## 2019-08-15 ENCOUNTER — Encounter: Payer: Self-pay | Admitting: Internal Medicine

## 2019-08-15 VITALS — BP 104/80 | HR 84 | Temp 97.4°F | Ht 62.0 in | Wt 174.0 lb

## 2019-08-15 DIAGNOSIS — R1110 Vomiting, unspecified: Secondary | ICD-10-CM | POA: Diagnosis not present

## 2019-08-15 NOTE — Patient Instructions (Signed)

## 2019-08-15 NOTE — Progress Notes (Signed)
HISTORY OF PRESENT ILLNESS:  Kelli Contreras is a 68 y.o. female, widow of Greggory Stallion, with past medical history as listed below who presents today for evaluation of problems with recurrent postprandial vomiting.  Patient was last seen July 25, 2018 when she underwent complete colonoscopy for colon cancer screening and issues with diarrhea.  The entire colon and ileum were normal.  Internal hemorrhoids incidentally noted.  Random colon biopsies were obtained and returned normal.  Since that time she has had fluctuating bowel habits, though no worse.  She tells me that her current history dates back to early July when she began to develop postprandial vomiting.  Typically a few hours after eating.  Always undigested food.  No pain or nausea but rather a pressure sensation would proceed vomiting spells.  Despite this she has gained 15 pounds.  She was given a short trial of PPI which did not help.  She does take NSAIDs intermittently for headache and joint aches.  No new medications.  No relevant blood work since last year.  At that time CBC was normal with hemoglobin 13.7.  Review of x-rays shows abdominal ultrasound July 12, 2018.  This was unremarkable.  No gallstones  REVIEW OF SYSTEMS:  All non-GI ROS negative unless otherwise stated in the HPI except for arthritis, back pain, cough, fatigue, headaches, nosebleeds, shortness of breath  Past Medical History:  Diagnosis Date  . Cataract    bil cataracts removed  . Depression   . Diarrhea   . Hypertension   . IBS (irritable bowel syndrome)    diarrhea  . Insomnia   . Migraine   . Plantar fasciitis     Past Surgical History:  Procedure Laterality Date  . ABDOMINAL HYSTERECTOMY     DUB; ovaries intact.  Marland Kitchen BREAST CYST ASPIRATION Bilateral   . BUNIONECTOMY  2007   Dr. Lestine Box  . VESICOVAGINAL FISTULA CLOSURE W/ TAH  1985   Dr. Nicholas Lose    Social History Johnney Ou Sindt  reports that she has never smoked. She has never used smokeless  tobacco. She reports that she does not drink alcohol or use drugs.  family history includes Breast cancer in her maternal aunt; CAD (age of onset: 89) in her father; Cancer in her father; Diabetes in her father; Heart disease in her father and mother; Hyperlipidemia in her father and mother; Hypertension in her brother, father, mother, and another family member; Valvular heart disease in her mother.  Allergies  Allergen Reactions  . Other Other (See Comments)    Onions & Chocolate cause migraine       PHYSICAL EXAMINATION: Vital signs: BP 104/80   Pulse 84   Temp (!) 97.4 F (36.3 C)   Ht 5\' 2"  (1.575 m)   Wt 174 lb (78.9 kg)   BMI 31.83 kg/m   Constitutional: generally well-appearing, no acute distress Psychiatric: alert and oriented x3, cooperative Eyes: extraocular movements intact, anicteric, conjunctiva pink Mouth: oral pharynx moist, no lesions Neck: supple no lymphadenopathy Cardiovascular: heart regular rate and rhythm, no murmur Lungs: clear to auscultation bilaterally Abdomen: soft, nontender, nondistended, no obvious ascites, no peritoneal signs, normal bowel sounds, no organomegaly.  No succussion splash Rectal: Omitted Extremities: no clubbing, cyanosis, or lower extremity edema bilaterally Skin: no lesions on visible extremities Neuro: No focal deficits.  Cranial nerves intact.  ASSESSMENT:  1.  Persistent problems with postprandial vomiting as described.  Rule out mechanical obstruction such as ulcer disease or tumor.  Rule out motility disorder  such as gastroparesis. 2.  Colon cancer screening.  Up-to-date.  Last examination September 2019 was negative for neoplasia 3.  Irritable bowel with intermittent loose stools.  Stable   PLAN:  1.  Schedule upper endoscopy to evaluate postprandial vomiting.The nature of the procedure, as well as the risks, benefits, and alternatives were carefully and thoroughly reviewed with the patient. Ample time for discussion and  questions allowed. The patient understood, was satisfied, and agreed to proceed. 2.  If no mechanical issues to explain symptoms are identified then proceed with solid-phase gastric emptying scan to rule out motility disorder.

## 2019-08-17 ENCOUNTER — Telehealth: Payer: Self-pay

## 2019-08-17 NOTE — Telephone Encounter (Signed)
Covid-19 screening questions   Do you now or have you had a fever in the last 14 days? NO   Do you have any respiratory symptoms of shortness of breath or cough now or in the last 14 days? NO  Do you have any family members or close contacts with diagnosed or suspected Covid-19 in the past 14 days? NO  Have you been tested for Covid-19 and found to be positive? NO        

## 2019-08-18 ENCOUNTER — Encounter: Payer: Self-pay | Admitting: Internal Medicine

## 2019-08-18 ENCOUNTER — Ambulatory Visit (AMBULATORY_SURGERY_CENTER): Payer: Medicare Other | Admitting: Internal Medicine

## 2019-08-18 ENCOUNTER — Other Ambulatory Visit: Payer: Self-pay

## 2019-08-18 VITALS — BP 122/79 | HR 71 | Temp 97.8°F | Resp 16 | Ht 62.0 in | Wt 174.0 lb

## 2019-08-18 DIAGNOSIS — R1110 Vomiting, unspecified: Secondary | ICD-10-CM

## 2019-08-18 DIAGNOSIS — R112 Nausea with vomiting, unspecified: Secondary | ICD-10-CM

## 2019-08-18 MED ORDER — SODIUM CHLORIDE 0.9 % IV SOLN: 500.0000 mL | Freq: Once | INTRAVENOUS | Status: DC

## 2019-08-18 NOTE — Op Note (Signed)
[place] Patient Name: Kelli Contreras Procedure Date: 08/18/2019 3:50 PM MRN: [id number] Endoscopist: Docia Chuck. Henrene Pastor , MD Age: 68 Referring MD:  Date of Birth: [birth year] Gender: Female Account #: [id number] Procedure:                Upper GI endoscopy Indications:              Nausea with vomiting Medicines:                Monitored Anesthesia Care Procedure:                Pre-Anesthesia Assessment:                           - Prior to the procedure, a History and Physical                            was performed, and patient medications and                            allergies were reviewed. The patient's tolerance of                            previous anesthesia was also reviewed. The risks                            and benefits of the procedure and the sedation                            options and risks were discussed with the patient.                            All questions were answered, and informed consent                            was obtained. Prior Anticoagulants: The patient has                            taken no previous anticoagulant or antiplatelet                            agents. ASA Grade Assessment: II - A patient with                            mild systemic disease. After reviewing the risks                            and benefits, the patient was deemed in                            satisfactory condition to undergo the procedure.                           After obtaining informed consent, the endoscope was  passed under direct vision. Throughout the                            procedure, the patient's blood pressure, pulse, and                            oxygen saturations were monitored continuously. The                            Endoscope was introduced through the mouth, and                            advanced to the second part of duodenum. The upper                            GI endoscopy was accomplished  without difficulty.                            The patient tolerated the procedure well. Scope In: Scope Out: Findings:                 The esophagus was normal.                           The stomach was normal.                           The examined duodenum was normal.                           The cardia and gastric fundus were normal on                            retroflexion. Complications:            No immediate complications. Estimated Blood Loss:     Estimated blood loss: none. Impression:               1. Normal EGD. Recommendation:           1. SCHEDULE solid-phase gastric emptying scan "rule                            out gastroparesis"                           2. Further recommendations after the above study                            completed. Kelli Contreras. Marina Goodell, MD 08/18/2019 4:10:36 PM This report has been signed electronically.

## 2019-08-18 NOTE — Progress Notes (Signed)
PT taken to PACU. Monitors in place. VSS. Report given to RN. 

## 2019-08-18 NOTE — Patient Instructions (Signed)
Impression/Recomomendations:  Schedule solid-phase gastric emptying scan.  Further recommendations after the above study completed.  YOU HAD AN ENDOSCOPIC PROCEDURE TODAY AT [facility]:   Refer to the procedure report that was given to you for any specific questions about what was found during the examination.  If the procedure report does not answer your questions, please call your gastroenterologist to clarify.  If you requested that your care partner not be given the details of your procedure findings, then the procedure report has been included in a sealed envelope for you to review at your convenience later.  YOU SHOULD EXPECT: Some feelings of bloating in the abdomen. Passage of more gas than usual.  Walking can help get rid of the air that was put into your GI tract during the procedure and reduce the bloating. If you had a lower endoscopy (such as a colonoscopy or flexible sigmoidoscopy) you may notice spotting of blood in your stool or on the toilet paper. If you underwent a bowel prep for your procedure, you may not have a normal bowel movement for a few days.  Please Note:  You might notice some irritation and congestion in your nose or some drainage.  This is from the oxygen used during your procedure.  There is no need for concern and it should clear up in a day or so.  SYMPTOMS TO REPORT IMMEDIATELY:   Following upper endoscopy (EGD)  Vomiting of blood or coffee ground material  New chest pain or pain under the shoulder blades  Painful or persistently difficult swallowing  New shortness of breath  Fever of 100F or higher  Black, tarry-looking stools  For urgent or emergent issues, a gastroenterologist can be reached at any hour by calling [phone].   DIET:  We do recommend a small meal at first, but then you may proceed to your regular diet.  Drink plenty of fluids but you should avoid alcoholic beverages for 24 hours.  ACTIVITY:  You should plan to  take it easy for the rest of today and you should NOT DRIVE or use heavy machinery until tomorrow (because of the sedation medicines used during the test).    FOLLOW UP: Our staff will call the number listed on your records 48-72 hours following your procedure to check on you and address any questions or concerns that you may have regarding the information given to you following your procedure. If we do not reach you, we will leave a message.  We will attempt to reach you two times.  During this call, we will ask if you have developed any symptoms of COVID 19. If you develop any symptoms (ie: fever, flu-like symptoms, shortness of breath, cough etc.) before then, please call [phone].  If you test positive for Covid 19 in the 2 weeks post procedure, please call and report this information to [place].    If any biopsies were taken you will be contacted by phone or by letter within the next 1-3 weeks.  Please call us at [phone] if you have not heard about the biopsies in 3 weeks.    SIGNATURES/CONFIDENTIALITY: You and/or your care partner have signed paperwork which will be entered into your electronic medical record.  These signatures attest to the fact that that the information above on your After Visit Summary has been reviewed and is understood.  Full responsibility of the confidentiality of this discharge information lies with you and/or your care-partner.

## 2019-08-18 NOTE — Progress Notes (Signed)
Temp check by LC/vital check by Stephen.  Reviewed medical and surgical history with the patient.

## 2019-08-21 ENCOUNTER — Other Ambulatory Visit: Payer: Self-pay

## 2019-08-21 ENCOUNTER — Telehealth: Payer: Self-pay

## 2019-08-21 DIAGNOSIS — R1110 Vomiting, unspecified: Secondary | ICD-10-CM

## 2019-08-21 NOTE — Telephone Encounter (Signed)
Pt scheduled for GES at [facility] 08/30/19@7 :30am, pt to arrive there at 7am. Pt to be NPO after midnight. Pt aware of appt.

## 2019-08-22 ENCOUNTER — Telehealth: Payer: Self-pay

## 2019-08-22 NOTE — Telephone Encounter (Signed)
  Follow up Call-  Call back number 08/18/2019 07/25/2018  Post procedure Call Back phone  # [phone] cell  Permission to leave phone message Yes Yes  Some recent data might be hidden     Patient questions:  Do you have a fever, pain , or abdominal swelling? No. Pain Score  0 *  Have you tolerated food without any problems? Yes.    Have you been able to return to your normal activities? Yes.    Do you have any questions about your discharge instructions: Diet   No. Medications  No. Follow up visit  No.  Do you have questions or concerns about your Care? No.  Actions: * If pain score is 4 or above: No action needed, pain <4. 1. Have you developed a fever since your procedure? no  2.   Have you had an respiratory symptoms (SOB or cough) since your procedure? no  3.   Have you tested positive for COVID 19 since your procedure no  4.   Have you had any family members/close contacts diagnosed with the COVID 19 since your procedure?  no   If yes to any of these questions please route to Joylene John, RN and Alphonsa Gin, Therapist, sports.

## 2019-08-28 ENCOUNTER — Other Ambulatory Visit: Payer: Self-pay | Admitting: Family Medicine

## 2019-08-28 MED ORDER — IMIPRAMINE HCL 50 MG PO TABS: ORAL_TABLET | ORAL | 0 refills | Status: DC

## 2019-08-28 NOTE — Telephone Encounter (Signed)
Copied from [place] [id number]. Topic: Quick Communication - Rx Refill/Question >> Aug 28, 2019  9:55 AM Rainey Pines A wrote: Medication: imipramine (TOFRANIL) 50 MG tablet(Patient would like a small supply of medication sent to local pharmacy until [facility] mail order is received. Patient would like a callback once medication has been sent to pharmacy.)  Has the patient contacted their pharmacy? Yes (Agent: If no, request that the patient contact the pharmacy for the refill.) (Agent: If yes, when and what did the pharmacy advise?)Contact  PCP  Preferred Pharmacy (with phone number or street name): [id number] [place], [address] [phone] (Phone) [phone] (Fax)    Agent: Please be advised that RX refills may take up to 3 business days. We ask that you follow-up with your pharmacy.

## 2019-08-30 ENCOUNTER — Ambulatory Visit (HOSPITAL_COMMUNITY)
Admission: RE | Admit: 2019-08-30 | Discharge: 2019-08-30 | Disposition: A | Discharge status: Home / Self Care | Payer: Medicare Other | Source: Ambulatory Visit | Attending: Internal Medicine | Admitting: Internal Medicine

## 2019-08-30 ENCOUNTER — Other Ambulatory Visit: Payer: Self-pay

## 2019-08-30 DIAGNOSIS — R1110 Vomiting, unspecified: Secondary | ICD-10-CM | POA: Diagnosis not present

## 2019-08-30 MED ORDER — TECHNETIUM TC 99M SULFUR COLLOID
2.1000 | Freq: Once | INTRAVENOUS | Status: AC | PRN
  Administered 2019-08-30: 2.1 via INTRAVENOUS

## 2019-08-31 ENCOUNTER — Other Ambulatory Visit: Payer: Self-pay

## 2019-08-31 MED ORDER — PANTOPRAZOLE SODIUM 40 MG PO TBEC: 40.0000 mg | DELAYED_RELEASE_TABLET | Freq: Every day | ORAL | 3 refills | Status: DC

## 2019-09-13 ENCOUNTER — Other Ambulatory Visit: Payer: Self-pay | Admitting: Family Medicine

## 2019-09-13 NOTE — Telephone Encounter (Signed)
Requested medication (s) are due for refill today: yes  Requested medication (s) are on the active medication list: yes  Last refill: 07/26/2019  Future visit scheduled: no  Notes to clinic:  Refill cannot be delegated    Requested Prescriptions  Pending Prescriptions Disp Refills   topiramate (TOPAMAX) 100 MG tablet [Pharmacy Med Name: TOPIRAMATE  100MG   TAB] 180 tablet 3    Sig: TAKE 1 TABLET BY MOUTH  TWICE DAILY     Not Delegated - Neurology: Anticonvulsants - topiramate & zonisamide Failed - 09/13/2019  7:36 AM      Failed - This refill cannot be delegated      Failed - Cr in normal range and within 360 days    Creat  Date Value Ref Range Status  05/16/2016 0.87 0.50 - 0.99 mg/dL Final    Comment:      For patients > or = 66 years of age: The upper reference limit for Creatinine is approximately 13% higher for people identified as African-American.      Creatinine, Ser  Date Value Ref Range Status  05/30/2018 0.96 0.57 - 1.00 mg/dL Final         Failed - CO2 in normal range and within 360 days    CO2  Date Value Ref Range Status  05/30/2018 26 20 - 29 mmol/L Final         Passed - Valid encounter within last 12 months    Recent Outpatient Visits          1 month ago Persistent recurrent vomiting   [facility], Lilia Argue, MD   6 months ago Essential hypertension   [facility] Care at [facility], Lilia Argue, MD   1 year ago Essential hypertension   Primary Care at [facility], Renette Butters, MD   1 year ago Essential hypertension   Primary Care at [facility], Renette Butters, MD   2 years ago Other fatigue   Primary Care at [facility], Renette Butters, MD

## 2019-09-13 NOTE — Telephone Encounter (Signed)
Requested medication (s) are due for refill today:yes  Requested medication (s) are on the active medication list: yes  Last refill: 07/26/2019  Future visit scheduled: no  Notes to clinic: ordering provider is different than pcp Review for refill   Requested Prescriptions  Pending Prescriptions Disp Refills   metoprolol succinate (TOPROL-XL) 50 MG 24 hr tablet [Pharmacy Med Name: METOPROLOL SUCC ER 50MG  TABLET] 90 tablet 3    Sig: TAKE 1 TABLET BY MOUTH  DAILY WITH OR IMMEDIATELY  FOLLOWING A MEAL     Cardiovascular:  Beta Blockers Passed - 09/13/2019  7:36 AM      Passed - Last BP in normal range    BP Readings from Last 1 Encounters:  08/18/19 122/79         Passed - Last Heart Rate in normal range    Pulse Readings from Last 1 Encounters:  08/18/19 71         Passed - Valid encounter within last 6 months    Recent Outpatient Visits          1 month ago Persistent recurrent vomiting   [facility], Meda CoffeeIrma M, MD   6 months ago Essential hypertension   [facility], Meda CoffeeIrma M, MD   1 year ago Essential hypertension   [facility], Myrle ShengKristi M, MD   1 year ago Essential hypertension   [facility], Myrle Sheng[facility]risti M, MD   2 years ago Other fatigue   [facility] Myrle Sheng[facility]risti M, MD              lisinopril (ZESTRIL) 20 MG tablet [Pharmacy Med Name: LISINOPRIL  20MG   TAB] 90 tablet 3    Sig: TAKE 1 TABLET BY MOUTH  DAILY     Cardiovascular:  ACE Inhibitors Failed - 09/13/2019  7:36 AM      Failed - Cr in normal range and within 180 days    Creat  Date Value Ref Range Status  05/16/2016 0.87 0.50 - 0.99 mg/dL Final    Comment:      For patients > or = 66 years of age: The upper reference limit for Creatinine is approximately 13% higher for people identified as African-American.      Creatinine, Ser  Date Value Ref Range Status  05/30/2018 0.96 0.57 - 1.00 mg/dL Final         Failed - K in  normal range and within 180 days    Potassium  Date Value Ref Range Status  05/30/2018 4.0 3.5 - 5.2 mmol/L Final         Passed - Patient is not pregnant      Passed - Last BP in normal range    BP Readings from Last 1 Encounters:  08/18/19 122/79         Passed - Valid encounter within last 6 months    Recent Outpatient Visits          1 month ago Persistent recurrent vomiting   [facility], Meda CoffeeIrma M, MD   6 months ago Essential hypertension   [facility], Meda CoffeeIrma M, MD   1 year ago Essential hypertension   [facility], Myrle ShengKristi M, MD   1 year ago Essential hypertension   [facility], Myrle ShengKristi M, MD   2 years ago Other fatigue   [facility], Myrle ShengKristi M,  MD              DULoxetine (CYMBALTA) 30 MG capsule [Pharmacy Med Name: DULOXETINE  30MG   CAP] 90 capsule 3    Sig: TAKE 1 CAPSULE BY MOUTH  DAILY     Psychiatry: Antidepressants - SNRI Failed - 09/13/2019  7:36 AM      Failed - Completed PHQ-2 or PHQ-9 in the last 360 days.      Passed - Last BP in normal range    BP Readings from Last 1 Encounters:  08/18/19 122/79         Passed - Valid encounter within last 6 months    Recent Outpatient Visits          1 month ago Persistent recurrent vomiting   Primary Care at 10/18/19, [facility], MD   6 months ago Essential hypertension   [facility] at [facility], [facility], MD   1 year ago Essential hypertension   Primary Care at [facility], MD   1 year ago Essential hypertension   Primary Care at [facility], [facility], MD   2 years ago Other fatigue   Primary Care at [facility], [facility], MD              hydrochlorothiazide (HYDRODIURIL) 25 MG tablet [Pharmacy Med Name: HYDROCHLOROTHIAZIDE  25MG   TAB] 90 tablet 3    Sig: TAKE 1 TABLET BY MOUTH  DAILY     Cardiovascular: Diuretics - Thiazide Failed - 09/13/2019  7:36 AM      Failed - Ca in normal range and  within 360 days    Calcium  Date Value Ref Range Status  05/30/2018 9.0 8.7 - 10.3 mg/dL Final         Failed - Cr in normal range and within 360 days    Creat  Date Value Ref Range Status  05/16/2016 0.87 0.50 - 0.99 mg/dL Final    Comment:      For patients > or = 66 years of age: The upper reference limit for Creatinine is approximately 13% higher for people identified as African-American.      Creatinine, Ser  Date Value Ref Range Status  05/30/2018 0.96 0.57 - 1.00 mg/dL Final         Failed - K in normal range and within 360 days    Potassium  Date Value Ref Range Status  05/30/2018 4.0 3.5 - 5.2 mmol/L Final         Failed - Na in normal range and within 360 days    Sodium  Date Value Ref Range Status  05/30/2018 145 (H) 134 - 144 mmol/L Final         Passed - Last BP in normal range    BP Readings from Last 1 Encounters:  08/18/19 122/79         Passed - Valid encounter within last 6 months    Recent Outpatient Visits          1 month ago Persistent recurrent vomiting   Primary Care at 06/01/2018, 10/18/19, MD   6 months ago Essential hypertension   [facility] at [facility], Meda Coffee, MD   1 year ago Essential hypertension   Primary Care at [facility], [facility], MD   1 year ago Essential hypertension   Primary Care at [facility], Myrle Sheng, MD   2 years ago Other fatigue   Primary Care at [facility],  Renette Butters, MD

## 2019-09-14 NOTE — Telephone Encounter (Signed)
Requesting 1 year supply - denied, patient has not been seen in person due to covid. Needs in person OV prior to next refills. 6 months of refills given.

## 2019-09-27 ENCOUNTER — Other Ambulatory Visit: Payer: Self-pay | Admitting: Family Medicine

## 2019-09-27 NOTE — Telephone Encounter (Signed)
Requested medication (s) are due for refill today: yes  Requested medication (s) are on the active medication list: yes  Last refill:  07/26/2019  Future visit scheduled: no  Notes to clinic:  Refill cannot be delegated    Requested Prescriptions  Pending Prescriptions Disp Refills   topiramate (TOPAMAX) 100 MG tablet [Pharmacy Med Name: TOPIRAMATE  100MG   TAB] 180 tablet 3    Sig: TAKE 1 TABLET BY MOUTH  TWICE DAILY     Not Delegated - Neurology: Anticonvulsants - topiramate & zonisamide Failed - 09/27/2019  9:15 AM      Failed - This refill cannot be delegated      Failed - Cr in normal range and within 360 days    Creat  Date Value Ref Range Status  05/16/2016 0.87 0.50 - 0.99 mg/dL Final    Comment:      For patients > or = 66 years of age: The upper reference limit for Creatinine is approximately 13% higher for people identified as African-American.      Creatinine, Ser  Date Value Ref Range Status  05/30/2018 0.96 0.57 - 1.00 mg/dL Final         Failed - CO2 in normal range and within 360 days    CO2  Date Value Ref Range Status  05/30/2018 26 20 - 29 mmol/L Final         Passed - Valid encounter within last 12 months    Recent Outpatient Visits          2 months ago Persistent recurrent vomiting   [facility] at [facility], Lilia Argue, MD   7 months ago Essential hypertension   [facility] at [facility], Lilia Argue, MD   1 year ago Essential hypertension   Primary Care at [facility], Renette Butters, MD   1 year ago Essential hypertension   Primary Care at [facility], Renette Butters, MD   2 years ago Other fatigue   Primary Care at [facility], Renette Butters, MD

## 2019-10-17 ENCOUNTER — Ambulatory Visit: Payer: Medicare Other | Admitting: Internal Medicine

## 2019-10-26 ENCOUNTER — Other Ambulatory Visit: Payer: Self-pay | Admitting: Family Medicine

## 2019-10-26 MED ORDER — IMIPRAMINE HCL 50 MG PO TABS: ORAL_TABLET | ORAL | 0 refills | Status: DC

## 2019-10-26 MED ORDER — TOPIRAMATE 100 MG PO TABS: 100.0000 mg | ORAL_TABLET | Freq: Two times a day (BID) | ORAL | 0 refills | Status: DC

## 2019-10-26 NOTE — Telephone Encounter (Signed)
Requested medication (s) are due for refill today: yes  Requested medication (s) are on the active medication list: yes  Last refill: 06/24/2019  Future visit scheduled: yes  Notes to clinic:  refill cannot be delegated    Requested Prescriptions  Pending Prescriptions Disp Refills   topiramate (TOPAMAX) 100 MG tablet 180 tablet 0    Sig: Take 1 tablet (100 mg total) by mouth 2 (two) times daily.      Not Delegated - Neurology: Anticonvulsants - topiramate & zonisamide Failed - 10/26/2019  3:02 PM      Failed - This refill cannot be delegated      Failed - Cr in normal range and within 360 days    Creat  Date Value Ref Range Status  05/16/2016 0.87 0.50 - 0.99 mg/dL Final    Comment:      For patients > or = 66 years of age: The upper reference limit for Creatinine is approximately 13% higher for people identified as African-American.      Creatinine, Ser  Date Value Ref Range Status  05/30/2018 0.96 0.57 - 1.00 mg/dL Final          Failed - CO2 in normal range and within 360 days    CO2  Date Value Ref Range Status  05/30/2018 26 20 - 29 mmol/L Final          Passed - Valid encounter within last 12 months    Recent Outpatient Visits           3 months ago Persistent recurrent vomiting   [facility] at [facility], Lilia Argue, MD   8 months ago Essential hypertension   [facility] at [facility], Lilia Argue, MD   1 year ago Essential hypertension   Primary Care at [facility], Renette Butters, MD   1 year ago Essential hypertension   Primary Care at [facility], Renette Butters, MD   2 years ago Other fatigue   Primary Care at [facility], Renette Butters, MD       Future Appointments             In 1 week Rutherford Guys, MD Primary Care at [place], [facility]             Signed Prescriptions Disp Refills   imipramine (TOFRANIL) 50 MG tablet 30 tablet 0    Sig: TAKE 2 TABLETS BY MOUTH AT  BEDTIME      Psychiatry:  Antidepressants - Heterocyclics (TCAs) Passed -  10/26/2019  3:02 PM      Passed - Valid encounter within last 6 months    Recent Outpatient Visits           3 months ago Persistent recurrent vomiting   [facility] at [facility], Lilia Argue, MD   8 months ago Essential hypertension   [facility] at [facility], Lilia Argue, MD   1 year ago Essential hypertension   Primary Care at [facility], Renette Butters, MD   1 year ago Essential hypertension   Primary Care at [facility], Renette Butters, MD   2 years ago Other fatigue   Primary Care at [facility], Renette Butters, MD       Future Appointments             In 1 week Rutherford Guys, MD Primary Care at [place], [facility]

## 2019-10-26 NOTE — Telephone Encounter (Signed)
Medication Refill - Medication: topiramate (TOPAMAX) 100 MG tablet imipramine (TOFRANIL) 50 MG tablet  Has the patient contacted their pharmacy? Yes - told to contact PCP (Agent: If no, request that the patient contact the pharmacy for the refill.) (Agent: If yes, when and what did the pharmacy advise?)  Preferred Pharmacy (with phone number or street name):  [place], [place] [employer] Phone:  [phone]  Fax:  [phone]     Agent: Please be advised that RX refills may take up to 3 business days. We ask that you follow-up with your pharmacy.

## 2019-11-06 ENCOUNTER — Ambulatory Visit: Payer: Medicare Other | Admitting: Family Medicine

## 2019-11-14 ENCOUNTER — Other Ambulatory Visit: Payer: Self-pay | Admitting: Family Medicine

## 2019-11-14 NOTE — Telephone Encounter (Signed)
Requested medication (s) are due for refill today: yes  Requested medication (s) are on the active medication list: yes  Last refill:  09/26/2019  Future visit scheduled: no  Notes to clinic:  pcp and ordering provider are different  Review for refill   Requested Prescriptions  Pending Prescriptions Disp Refills   potassium chloride SA (KLOR-CON) 20 MEQ tablet [Pharmacy Med Name: POTASSIUM  CONTROLLED RELEASE TABLET  SR CHLORIDE MC TB] 90 tablet 3    Sig: TAKE 1 TABLET BY MOUTH  DAILY      Endocrinology:  Minerals - Potassium Supplementation Failed - 11/14/2019  6:36 AM      Failed - K in normal range and within 360 days    Potassium  Date Value Ref Range Status  05/30/2018 4.0 3.5 - 5.2 mmol/L Final          Failed - Cr in normal range and within 360 days    Creat  Date Value Ref Range Status  05/16/2016 0.87 0.50 - 0.99 mg/dL Final    Comment:      For patients > or = 67 years of age: The upper reference limit for Creatinine is approximately 13% higher for people identified as African-American.      Creatinine, Ser  Date Value Ref Range Status  05/30/2018 0.96 0.57 - 1.00 mg/dL Final          Passed - Valid encounter within last 12 months    Recent Outpatient Visits           3 months ago Persistent recurrent vomiting   [facility] at [facility], Meda Coffee, MD   8 months ago Essential hypertension   [facility] at [facility], Meda Coffee, MD   1 year ago Essential hypertension   Primary Care at [facility], Myrle Sheng, MD   1 year ago Essential hypertension   Primary Care at [facility], Myrle Sheng, MD   2 years ago Other fatigue   Primary Care at [facility], Myrle Sheng, MD

## 2019-11-15 NOTE — Telephone Encounter (Signed)
Okay to refill for a year supply?

## 2019-11-16 MED ORDER — POTASSIUM CHLORIDE CRYS ER 20 MEQ PO TBCR: 20.0000 meq | EXTENDED_RELEASE_TABLET | Freq: Every day | ORAL | 0 refills | Status: DC

## 2019-11-17 NOTE — Telephone Encounter (Signed)
Lvm for patient to call back and reschedule

## 2019-11-21 ENCOUNTER — Encounter: Payer: Self-pay | Admitting: Family Medicine

## 2019-11-21 ENCOUNTER — Other Ambulatory Visit: Payer: Self-pay

## 2019-11-21 ENCOUNTER — Ambulatory Visit: Payer: Medicare PPO | Admitting: Family Medicine

## 2019-11-21 VITALS — BP 93/67 | HR 74 | Temp 98.3°F | Ht 62.0 in | Wt 170.6 lb

## 2019-11-21 DIAGNOSIS — I959 Hypotension, unspecified: Secondary | ICD-10-CM

## 2019-11-21 DIAGNOSIS — R82998 Other abnormal findings in urine: Secondary | ICD-10-CM | POA: Diagnosis not present

## 2019-11-21 LAB — POCT CBC
Granulocyte percent: 62.6 %G (ref 37–80)
HCT, POC: 39.8 % (ref 29–41)
Hemoglobin: 13.3 g/dL (ref 11–14.6)
Lymph, poc: 1.4 (ref 0.6–3.4)
MCH, POC: 29.3 pg (ref 27–31.2)
MCHC: 33.4 g/dL (ref 31.8–35.4)
MCV: 87.7 fL (ref 76–111)
MID (cbc): 0.5 (ref 0–0.9)
MPV: 7.1 fL (ref 0–99.8)
POC Granulocyte: 3.2 (ref 2–6.9)
POC LYMPH PERCENT: 26.8 %L (ref 10–50)
POC MID %: 10.6 %M (ref 0–12)
Platelet Count, POC: 193 10*3/uL (ref 142–424)
RBC: 4.54 M/uL (ref 4.04–5.48)
RDW, POC: 13.3 %
WBC: 5.1 10*3/uL (ref 4.6–10.2)

## 2019-11-21 LAB — POCT URINALYSIS DIP (MANUAL ENTRY)
Glucose, UA: NEGATIVE mg/dL
Nitrite, UA: NEGATIVE
Protein Ur, POC: 30 mg/dL — AB
Spec Grav, UA: 1.025 (ref 1.010–1.025)
Urobilinogen, UA: 2 E.U./dL — AB
pH, UA: 7 (ref 5.0–8.0)

## 2019-11-21 MED ORDER — NITROFURANTOIN MONOHYD MACRO 100 MG PO CAPS: 100.0000 mg | ORAL_CAPSULE | Freq: Two times a day (BID) | ORAL | 0 refills | Status: DC

## 2019-11-21 NOTE — Progress Notes (Signed)
1/12/20214:15 PM  Kelli Contreras 12/14/1950, 69 y.o., female [id number]  Chief Complaint  Patient presents with  . Medication Refill    tested positive for covid on the 24th of Dec, has not been feeling well since. Says she is weak and dizzy    HPI:   Patient is a 69 y.o. female with past medical history significant for HTN, hypokalemia, insomnia, IBS with diarrhea, arthritis, depression, anxiety and migraines who presents today for routine followup  Last OV sept 2020 - telemedicine Referred to GI for recurrent vomiting Normal EGD and gastric emptying study  She tested positive for covid on dec 24 Started to have intermittent dizziness, nausea, right sided chest pain, vomiting, coughing Has been drinking gatorade to help with fluids Denies any SOB, fever, chills, diarrhea    Depression screen [facility] 2/9 11/21/2019 02/23/2019 05/30/2018  Decreased Interest 0 0 0  Down, Depressed, Hopeless 0 0 0  PHQ - 2 Score 0 0 0    Fall Risk  11/21/2019 02/23/2019 05/30/2018 07/13/2017 02/11/2017  Falls in the past year? 0 0 No No No  Number falls in past yr: 0 0 - - -  Injury with Fall? 0 0 - - -  Follow up - Falls evaluation completed - - -     Allergies  Allergen Reactions  . Other Other (See Comments)    Onions & Chocolate cause migraine    Prior to Admission medications   Medication Sig Start Date End Date Taking? Authorizing Provider  aspirin 81 MG tablet Take 81 mg by mouth at bedtime.     [provider]  DULoxetine (CYMBALTA) 30 MG capsule TAKE 1 CAPSULE BY MOUTH  DAILY 09/14/19   Rutherford Guys, MD  hydrochlorothiazide (HYDRODIURIL) 25 MG tablet TAKE 1 TABLET BY MOUTH  DAILY 09/14/19   Rutherford Guys, MD  imipramine (TOFRANIL) 50 MG tablet TAKE 2 TABLETS BY MOUTH AT  BEDTIME 10/26/19   Rutherford Guys, MD  lisinopril (ZESTRIL) 20 MG tablet TAKE 1 TABLET BY MOUTH  DAILY 09/14/19   Rutherford Guys, MD  meloxicam (MOBIC) 15 MG tablet Take 1 tablet (15 mg total) by  mouth daily. 05/30/18   Wardell Honour, MD  methocarbamol (ROBAXIN) 500 MG tablet Take 1 tablet (500 mg total) by mouth every 8 (eight) hours as needed for muscle spasms. 07/25/19   Rutherford Guys, MD  metoprolol succinate (TOPROL-XL) 50 MG 24 hr tablet TAKE 1 TABLET BY MOUTH  DAILY WITH OR IMMEDIATELY  FOLLOWING A MEAL 09/14/19   Rutherford Guys, MD  ondansetron (ZOFRAN) 4 MG tablet Take 1 tablet (4 mg total) by mouth every 4 (four) hours as needed for nausea or vomiting. Needs to get this from PCP or schedule appointment with our office 05/16/19   Irene Shipper, MD  pantoprazole (PROTONIX) 40 MG tablet Take 1 tablet (40 mg total) by mouth daily. 08/31/19   Irene Shipper, MD  potassium chloride SA (KLOR-CON M20) 20 MEQ tablet Take 1 tablet (20 mEq total) by mouth daily. Needs in office visit for future refills 11/16/19   Rutherford Guys, MD  topiramate (TOPAMAX) 100 MG tablet Take 1 tablet (100 mg total) by mouth 2 (two) times daily. 10/26/19   Rutherford Guys, MD  zolpidem (AMBIEN) 5 MG tablet Take 1 tablet (5 mg total) by mouth at bedtime as needed. 12/11/17   Wardell Honour, MD    Past Medical History:  Diagnosis Date  .  Cataract    bil cataracts removed  . Depression   . Diarrhea   . Hypertension   . IBS (irritable bowel syndrome)    diarrhea  . Insomnia   . Migraine   . Plantar fasciitis     Past Surgical History:  Procedure Laterality Date  . ABDOMINAL HYSTERECTOMY     DUB; ovaries intact.  Marland Kitchen BREAST CYST ASPIRATION Bilateral   . BUNIONECTOMY  2007   Dr. Lestine Box  . VESICOVAGINAL FISTULA CLOSURE W/ TAH  1985   Dr. Nicholas Lose    Social History   Tobacco Use  . Smoking status: Never Smoker  . Smokeless tobacco: Never Used  Substance Use Topics  . Alcohol use: No    Family History  Problem Relation Age of Onset  . CAD Father 48  . Cancer Father        bladder cancer  . Diabetes Father   . Heart disease Father   . Hyperlipidemia Father   . Hypertension Father   .  Valvular heart disease Mother        MVP  . Heart disease Mother   . Hyperlipidemia Mother   . Hypertension Mother   . Hypertension Brother   . Hypertension Other        mother, father, brother  . Breast cancer Maternal Aunt   . Colon cancer Neg Hx   . Esophageal cancer Neg Hx   . Rectal cancer Neg Hx   . Stomach cancer Neg Hx     ROS Per hpi  OBJECTIVE:  Today's Vitals   11/21/19 1600  BP: 93/67  Pulse: 74  Temp: 98.3 F (36.8 C)  SpO2: 94%  Weight: 170 lb 9.6 oz (77.4 kg)  Height: 5\' 2"  (1.575 m)   Body mass index is 31.2 kg/m.  BP Readings from Last 3 Encounters:  11/21/19 93/67  08/18/19 122/79  08/15/19 104/80    Wt Readings from Last 3 Encounters:  11/21/19 170 lb 9.6 oz (77.4 kg)  08/18/19 174 lb (78.9 kg)  08/15/19 174 lb (78.9 kg)    Physical Exam Vitals and nursing note reviewed.  Constitutional:      Appearance: She is well-developed. She is not toxic-appearing.  HENT:     Head: Normocephalic and atraumatic.     Right Ear: Hearing, tympanic membrane, ear canal and external ear normal.     Left Ear: Hearing, tympanic membrane, ear canal and external ear normal.     Mouth/Throat:     Mouth: Mucous membranes are dry.  Eyes:     Extraocular Movements: Extraocular movements intact.     Conjunctiva/sclera: Conjunctivae normal.     Pupils: Pupils are equal, round, and reactive to light.  Cardiovascular:     Rate and Rhythm: Normal rate and regular rhythm.     Heart sounds: Normal heart sounds. No murmur. No friction rub. No gallop.   Pulmonary:     Effort: Pulmonary effort is normal.     Breath sounds: Normal breath sounds. No wheezing, rhonchi or rales.  Abdominal:     General: Bowel sounds are normal. There is no distension.     Palpations: Abdomen is soft.     Tenderness: There is no abdominal tenderness.  Musculoskeletal:     Cervical back: Neck supple.     Right lower leg: No edema.     Left lower leg: No edema.  Lymphadenopathy:      Cervical: No cervical adenopathy.  Skin:    General: Skin is  warm and dry.     Capillary Refill: Capillary refill takes more than 3 seconds.  Neurological:     Mental Status: She is alert and oriented to person, place, and time.     Results for orders placed or performed in visit on 11/21/19 (from the past 24 hour(s))  POCT CBC     Status: None   Collection Time: 11/21/19  4:43 PM  Result Value Ref Range   WBC 5.1 4.6 - 10.2 K/uL   Lymph, poc 1.4 0.6 - 3.4   POC LYMPH PERCENT 26.8 10 - 50 %L   MID (cbc) 0.5 0 - 0.9   POC MID % 10.6 0 - 12 %M   POC Granulocyte 3.2 2 - 6.9   Granulocyte percent 62.6 37 - 80 %G   RBC 4.54 4.04 - 5.48 M/uL   Hemoglobin 13.3 11 - 14.6 g/dL   HCT, POC 52.8 29 - 41 %   MCV 87.7 76 - 111 fL   MCH, POC 29.3 27 - 31.2 pg   MCHC 33.4 31.8 - 35.4 g/dL   RDW, POC 41.3 %   Platelet Count, POC 193 142 - 424 K/uL   MPV 7.1 0 - 99.8 fL  POCT urinalysis dipstick     Status: Abnormal   Collection Time: 11/21/19  4:51 PM  Result Value Ref Range   Color, UA yellow yellow   Clarity, UA clear clear   Glucose, UA negative negative mg/dL   Bilirubin, UA small (A) negative   Ketones, POC UA trace (5) (A) negative mg/dL   Spec Grav, UA 2.440 1.027 - 1.025   Blood, UA trace-lysed (A) negative   pH, UA 7.0 5.0 - 8.0   Protein Ur, POC =30 (A) negative mg/dL   Urobilinogen, UA 2.0 (A) 0.2 or 1.0 E.U./dL   Nitrite, UA Negative Negative   Leukocytes, UA Large (3+) (A) Negative    No results found.  My interpretation of EKG:  NSR, HR 72, unchanged compared to 2019  ASSESSMENT and PLAN  1. Hypotension, unspecified hypotension type Most likely 2/2 dehydration due to covid. Push fluids. Hold off on HCTZ for next several days. RTC precautions given. - EKG 12-Lead - TSH - Comprehensive metabolic panel - POCT urinalysis dipstick - POCT CBC  2. Leukocytes in urine Empiric abx for UTI. Urine cx pending.  - Urine culture  Other orders - nitrofurantoin,  macrocrystal-monohydrate, (MACROBID) 100 MG capsule; Take 1 capsule (100 mg total) by mouth 2 (two) times daily.  Return in about 1 week (around 11/28/2019).    Myles Lipps, MD Primary Care at [facility] [address] [place], [place] [address] Ph.  [phone] Fax [phone]

## 2019-11-21 NOTE — Patient Instructions (Addendum)
  Hold HCTZ for now Treating for UTI Monitor BP at home   If you have lab work done today you will be contacted with your lab results within the next 2 weeks.  If you have not heard from [place] then please contact us. The fastest way to get your results is to register for My Chart.   IF you received an x-ray today, you will receive an invoice from [facility]. Please contact [facility] at [phone] with questions or concerns regarding your invoice.   IF you received labwork today, you will receive an invoice from [facility]. Please contact [facility] at [phone] with questions or concerns regarding your invoice.   Our billing staff will not be able to assist you with questions regarding bills from these companies.  You will be contacted with the lab results as soon as they are available. The fastest way to get your results is to activate your My Chart account. Instructions are located on the last page of this paperwork. If you have not heard from [place] regarding the results in 2 weeks, please contact this office.

## 2019-11-22 LAB — COMPREHENSIVE METABOLIC PANEL
ALT: 17 IU/L (ref 0–32)
AST: 20 IU/L (ref 0–40)
Albumin/Globulin Ratio: 1.4 (ref 1.2–2.2)
Albumin: 3.8 g/dL (ref 3.8–4.8)
Alkaline Phosphatase: 107 IU/L (ref 39–117)
BUN/Creatinine Ratio: 22 (ref 12–28)
BUN: 21 mg/dL (ref 8–27)
Bilirubin Total: 0.4 mg/dL (ref 0.0–1.2)
CO2: 29 mmol/L (ref 20–29)
Calcium: 8.8 mg/dL (ref 8.7–10.3)
Chloride: 102 mmol/L (ref 96–106)
Creatinine, Ser: 0.94 mg/dL (ref 0.57–1.00)
GFR calc Af Amer: 73 mL/min/{1.73_m2} (ref >59)
GFR calc non Af Amer: 63 mL/min/{1.73_m2} (ref >59)
Globulin, Total: 2.8 g/dL (ref 1.5–4.5)
Glucose: 87 mg/dL (ref 65–99)
Potassium: 3.3 mmol/L — ABNORMAL LOW (ref 3.5–5.2)
Sodium: 141 mmol/L (ref 134–144)
Total Protein: 6.6 g/dL (ref 6.0–8.5)

## 2019-11-22 LAB — TSH: TSH: 1.03 u[IU]/mL (ref 0.450–4.500)

## 2019-11-23 LAB — URINE CULTURE: Organism ID, Bacteria: NO GROWTH

## 2019-11-28 ENCOUNTER — Encounter: Payer: Self-pay | Admitting: Family Medicine

## 2019-11-28 ENCOUNTER — Ambulatory Visit: Payer: Medicare PPO | Admitting: Family Medicine

## 2019-11-28 ENCOUNTER — Other Ambulatory Visit: Payer: Self-pay

## 2019-11-28 VITALS — BP 122/76 | HR 70 | Temp 98.3°F | Ht 62.0 in | Wt 177.0 lb

## 2019-11-28 DIAGNOSIS — Z8639 Personal history of other endocrine, nutritional and metabolic disease: Secondary | ICD-10-CM

## 2019-11-28 DIAGNOSIS — Z8616 Personal history of COVID-19: Secondary | ICD-10-CM

## 2019-11-28 DIAGNOSIS — I10 Essential (primary) hypertension: Secondary | ICD-10-CM

## 2019-11-28 MED ORDER — IMIPRAMINE HCL 50 MG PO TABS: ORAL_TABLET | ORAL | 1 refills | Status: DC

## 2019-11-28 NOTE — Progress Notes (Signed)
1/19/202111:47 AM  Kelli Contreras 1951-03-29, 69 y.o., female [id number]  Chief Complaint  Patient presents with  . Follow-up    says she is stilling having headaches, not sure if it is arthritis    HPI:   Patient is a 69 y.o. female with past medical history significant for HTN, hypokalemia, insomnia, IBS with diarrhea, arthritis, depression, anxiety and migraines who presents today for routine followup for post covid sequela  Last OV a week ago - low BP, hold HCTZ, push fluids, labs normal Tested covid positive dec 24  Overall she is feeling much better She is not taking HCTZ as discussed Her BP at home at goal She is tolerating po well Having headache and cough intermittently after covid She has no acute concerns today  Depression screen [facility] 2/9 11/28/2019 11/21/2019 02/23/2019  Decreased Interest 0 0 0  Down, Depressed, Hopeless 0 0 0  PHQ - 2 Score 0 0 0    Fall Risk  11/28/2019 11/21/2019 02/23/2019 05/30/2018 07/13/2017  Falls in the past year? 0 0 0 No No  Number falls in past yr: 0 0 0 - -  Injury with Fall? 0 0 0 - -  Follow up - - Falls evaluation completed - -     Allergies  Allergen Reactions  . Other Other (See Comments)    Onions & Chocolate cause migraine    Prior to Admission medications   Medication Sig Start Date End Date Taking? Authorizing Provider  aspirin 81 MG tablet Take 81 mg by mouth at bedtime.    Yes [provider]  DULoxetine (CYMBALTA) 30 MG capsule TAKE 1 CAPSULE BY MOUTH  DAILY 09/14/19  Yes Rutherford Guys, MD  hydrochlorothiazide (HYDRODIURIL) 25 MG tablet TAKE 1 TABLET BY MOUTH  DAILY 09/14/19  Yes Rutherford Guys, MD  imipramine (TOFRANIL) 50 MG tablet TAKE 2 TABLETS BY MOUTH AT  BEDTIME 10/26/19  Yes Rutherford Guys, MD  lisinopril (ZESTRIL) 20 MG tablet TAKE 1 TABLET BY MOUTH  DAILY 09/14/19  Yes Rutherford Guys, MD  meloxicam (MOBIC) 15 MG tablet Take 1 tablet (15 mg total) by mouth daily. Patient taking differently:  Take 15 mg by mouth daily. PRN 05/30/18  Yes Wardell Honour, MD  methocarbamol (ROBAXIN) 500 MG tablet Take 1 tablet (500 mg total) by mouth every 8 (eight) hours as needed for muscle spasms. Patient taking differently: Take 500 mg by mouth every 8 (eight) hours as needed for muscle spasms. PRN 07/25/19  Yes Rutherford Guys, MD  metoprolol succinate (TOPROL-XL) 50 MG 24 hr tablet TAKE 1 TABLET BY MOUTH  DAILY WITH OR IMMEDIATELY  FOLLOWING A MEAL 09/14/19  Yes Rutherford Guys, MD  pantoprazole (PROTONIX) 40 MG tablet Take 1 tablet (40 mg total) by mouth daily. 08/31/19  Yes Irene Shipper, MD  potassium chloride SA (KLOR-CON M20) 20 MEQ tablet Take 1 tablet (20 mEq total) by mouth daily. Needs in office visit for future refills Patient taking differently: Take 20 mEq by mouth daily. Needs in office visit for future refills PRN 11/16/19  Yes Rutherford Guys, MD  topiramate (TOPAMAX) 100 MG tablet Take 1 tablet (100 mg total) by mouth 2 (two) times daily. 10/26/19  Yes Rutherford Guys, MD  zolpidem (AMBIEN) 5 MG tablet Take 1 tablet (5 mg total) by mouth at bedtime as needed. 12/11/17  Yes Wardell Honour, MD    Past Medical History:  Diagnosis Date  . Cataract  bil cataracts removed  . Depression   . Diarrhea   . Hypertension   . IBS (irritable bowel syndrome)    diarrhea  . Insomnia   . Migraine   . Plantar fasciitis     Past Surgical History:  Procedure Laterality Date  . ABDOMINAL HYSTERECTOMY     DUB; ovaries intact.  Marland Kitchen BREAST CYST ASPIRATION Bilateral   . BUNIONECTOMY  2007   Dr. Lestine Box  . VESICOVAGINAL FISTULA CLOSURE W/ TAH  1985   Dr. Nicholas Lose    Social History   Tobacco Use  . Smoking status: Never Smoker  . Smokeless tobacco: Never Used  Substance Use Topics  . Alcohol use: No    Family History  Problem Relation Age of Onset  . CAD Father 58  . Cancer Father        bladder cancer  . Diabetes Father   . Heart disease Father   . Hyperlipidemia Father   .  Hypertension Father   . Valvular heart disease Mother        MVP  . Heart disease Mother   . Hyperlipidemia Mother   . Hypertension Mother   . Hypertension Brother   . Hypertension Other        mother, father, brother  . Breast cancer Maternal Aunt   . Colon cancer Neg Hx   . Esophageal cancer Neg Hx   . Rectal cancer Neg Hx   . Stomach cancer Neg Hx     Review of Systems  Constitutional: Negative for chills and fever.  Respiratory: Positive for cough. Negative for shortness of breath.   Cardiovascular: Negative for chest pain, palpitations and leg swelling.  Gastrointestinal: Negative for abdominal pain, nausea and vomiting.     OBJECTIVE:  Today's Vitals   11/28/19 1132  BP: 122/76  Pulse: 70  Temp: 98.3 F (36.8 C)  SpO2: 99%  Weight: 177 lb (80.3 kg)  Height: 5\' 2"  (1.575 m)   Body mass index is 32.37 kg/m.   Physical Exam Vitals and nursing note reviewed.  Constitutional:      Appearance: She is well-developed.  HENT:     Head: Normocephalic and atraumatic.     Mouth/Throat:     Pharynx: No oropharyngeal exudate.  Eyes:     General: No scleral icterus.    Conjunctiva/sclera: Conjunctivae normal.     Pupils: Pupils are equal, round, and reactive to light.  Cardiovascular:     Rate and Rhythm: Normal rate and regular rhythm.     Heart sounds: Normal heart sounds. No murmur. No friction rub. No gallop.   Pulmonary:     Effort: Pulmonary effort is normal.     Breath sounds: Normal breath sounds. No wheezing or rales.  Musculoskeletal:     Cervical back: Neck supple.     Right lower leg: No edema.     Left lower leg: No edema.  Skin:    General: Skin is warm and dry.  Neurological:     Mental Status: She is alert and oriented to person, place, and time.     No results found for this or any previous visit (from the past 24 hour(s)).  No results found.   ASSESSMENT and PLAN  1. Essential hypertension 2. H/O hypokalemia 3. History of  COVID-19 Hypotension resolved. BP at goal. Cont to recover from covid. Cont home BP monitoring. Back to normal PO intake. Recheck BP and K at next OV visit.  Other orders - imipramine (TOFRANIL) 50  MG tablet; TAKE 2 TABLETS BY MOUTH AT  BEDTIME  Return in about 4 weeks (around 12/26/2019).    Myles Lipps, MD Primary Care at [facility] [address] [place], [place] [address] Ph.  [phone] Fax [phone]

## 2019-12-20 ENCOUNTER — Telehealth: Payer: Self-pay | Admitting: *Deleted

## 2019-12-20 NOTE — Telephone Encounter (Signed)
Mailbox full Schedule AWV 

## 2019-12-26 ENCOUNTER — Ambulatory Visit: Payer: Medicare PPO | Admitting: Family Medicine

## 2019-12-26 ENCOUNTER — Other Ambulatory Visit: Payer: Self-pay

## 2019-12-26 ENCOUNTER — Encounter: Payer: Self-pay | Admitting: Family Medicine

## 2019-12-26 VITALS — BP 128/88 | HR 76 | Temp 98.0°F | Ht 62.0 in | Wt 174.0 lb

## 2019-12-26 DIAGNOSIS — Z780 Asymptomatic menopausal state: Secondary | ICD-10-CM

## 2019-12-26 DIAGNOSIS — M25511 Pain in right shoulder: Secondary | ICD-10-CM

## 2019-12-26 DIAGNOSIS — Z8639 Personal history of other endocrine, nutritional and metabolic disease: Secondary | ICD-10-CM | POA: Diagnosis not present

## 2019-12-26 DIAGNOSIS — I10 Essential (primary) hypertension: Secondary | ICD-10-CM

## 2019-12-26 LAB — BASIC METABOLIC PANEL
BUN/Creatinine Ratio: 17 (ref 12–28)
BUN: 19 mg/dL (ref 8–27)
CO2: 21 mmol/L (ref 20–29)
Calcium: 9.1 mg/dL (ref 8.7–10.3)
Chloride: 110 mmol/L — ABNORMAL HIGH (ref 96–106)
Creatinine, Ser: 1.09 mg/dL — ABNORMAL HIGH (ref 0.57–1.00)
GFR calc Af Amer: 61 mL/min/{1.73_m2} (ref >59)
GFR calc non Af Amer: 53 mL/min/{1.73_m2} — ABNORMAL LOW (ref >59)
Glucose: 62 mg/dL — ABNORMAL LOW (ref 65–99)
Potassium: 4 mmol/L (ref 3.5–5.2)
Sodium: 142 mmol/L (ref 134–144)

## 2019-12-26 NOTE — Progress Notes (Signed)
2/16/202110:31 AM  Kelli Contreras Mar 11, 1951, 69 y.o., female [id number]  Chief Complaint  Patient presents with  . Follow-up    BP    HPI:   Patient is a 69 y.o. female with past medical history significant for HTN, hypokalemia, insomnia, IBS with diarrhea, arthritis, depression, anxiety and migraineswho presents today for followup on BP  Last OV a month ago - hold HCTZ as she recovered from covid, started KCL  Overall doing well BP at home similar today  Having recent right shoulder pain, over deltoid and further down, no trauma or injury Yesterday painful enough that she couldn't pick up her granddaughter Denies any radiation up her neck, numbness or tingling down her arm She states flexeril helped last night  Wt Readings from Last 3 Encounters:  12/26/19 174 lb (78.9 kg)  11/28/19 177 lb (80.3 kg)  11/21/19 170 lb 9.6 oz (77.4 kg)     Lab Results  Component Value Date   CREATININE 0.94 11/21/2019   BUN 21 11/21/2019   NA 141 11/21/2019   K 3.3 (L) 11/21/2019   CL 102 11/21/2019   CO2 29 11/21/2019    BP Readings from Last 3 Encounters:  12/26/19 128/88  11/28/19 122/76  11/21/19 93/67    Depression screen [facility] 2/9 12/26/2019 12/26/2019 11/28/2019  Decreased Interest 0 0 0  Down, Depressed, Hopeless 0 0 0  PHQ - 2 Score 0 0 0    Fall Risk  12/26/2019 12/26/2019 11/28/2019 11/21/2019 02/23/2019  Falls in the past year? 0 0 0 0 0  Number falls in past yr: 0 0 0 0 0  Injury with Fall? 0 0 0 0 0  Follow up - - - - Falls evaluation completed     Allergies  Allergen Reactions  . Other Other (See Comments)    Onions & Chocolate cause migraine    Prior to Admission medications   Medication Sig Start Date End Date Taking? Authorizing Provider  aspirin 81 MG tablet Take 81 mg by mouth at bedtime.    Yes [provider]  DULoxetine (CYMBALTA) 30 MG capsule TAKE 1 CAPSULE BY MOUTH  DAILY 09/14/19  Yes Rutherford Guys, MD  imipramine (TOFRANIL) 50 MG  tablet TAKE 2 TABLETS BY MOUTH AT  BEDTIME 11/28/19  Yes Rutherford Guys, MD  lisinopril (ZESTRIL) 20 MG tablet TAKE 1 TABLET BY MOUTH  DAILY 09/14/19  Yes Rutherford Guys, MD  meloxicam (MOBIC) 15 MG tablet Take 1 tablet (15 mg total) by mouth daily. Patient taking differently: Take 15 mg by mouth daily. PRN 05/30/18  Yes Wardell Honour, MD  methocarbamol (ROBAXIN) 500 MG tablet Take 1 tablet (500 mg total) by mouth every 8 (eight) hours as needed for muscle spasms. Patient taking differently: Take 500 mg by mouth every 8 (eight) hours as needed for muscle spasms. PRN 07/25/19  Yes Rutherford Guys, MD  metoprolol succinate (TOPROL-XL) 50 MG 24 hr tablet TAKE 1 TABLET BY MOUTH  DAILY WITH OR IMMEDIATELY  FOLLOWING A MEAL 09/14/19  Yes Rutherford Guys, MD  pantoprazole (PROTONIX) 40 MG tablet Take 1 tablet (40 mg total) by mouth daily. 08/31/19  Yes Irene Shipper, MD  potassium chloride SA (KLOR-CON M20) 20 MEQ tablet Take 1 tablet (20 mEq total) by mouth daily. Needs in office visit for future refills Patient taking differently: Take 20 mEq by mouth daily. Needs in office visit for future refills PRN 11/16/19  Yes Rutherford Guys, MD  topiramate (TOPAMAX) 100 MG tablet Take 1 tablet (100 mg total) by mouth 2 (two) times daily. 10/26/19  Yes Myles Lipps, MD  zolpidem (AMBIEN) 5 MG tablet Take 1 tablet (5 mg total) by mouth at bedtime as needed. 12/11/17  Yes Ethelda Chick, MD    Past Medical History:  Diagnosis Date  . Cataract    bil cataracts removed  . Depression   . Diarrhea   . Hypertension   . IBS (irritable bowel syndrome)    diarrhea  . Insomnia   . Migraine   . Plantar fasciitis     Past Surgical History:  Procedure Laterality Date  . ABDOMINAL HYSTERECTOMY     DUB; ovaries intact.  Marland Kitchen BREAST CYST ASPIRATION Bilateral   . BUNIONECTOMY  2007   Dr. Lestine Box  . VESICOVAGINAL FISTULA CLOSURE W/ TAH  1985   Dr. Nicholas Lose    Social History   Tobacco Use  . Smoking status:  Never Smoker  . Smokeless tobacco: Never Used  Substance Use Topics  . Alcohol use: No    Family History  Problem Relation Age of Onset  . CAD Father 13  . Cancer Father        bladder cancer  . Diabetes Father   . Heart disease Father   . Hyperlipidemia Father   . Hypertension Father   . Valvular heart disease Mother        MVP  . Heart disease Mother   . Hyperlipidemia Mother   . Hypertension Mother   . Hypertension Brother   . Hypertension Other        mother, father, brother  . Breast cancer Maternal Aunt   . Colon cancer Neg Hx   . Esophageal cancer Neg Hx   . Rectal cancer Neg Hx   . Stomach cancer Neg Hx     ROS   OBJECTIVE:  Today's Vitals   12/26/19 1019  BP: 128/88  Pulse: 76  Temp: 98 F (36.7 C)  SpO2: 97%  Weight: 174 lb (78.9 kg)   Body mass index is 31.83 kg/m.   Physical Exam Vitals and nursing note reviewed.  Constitutional:      Appearance: She is well-developed.  HENT:     Head: Normocephalic and atraumatic.     Mouth/Throat:     Pharynx: No oropharyngeal exudate.  Eyes:     General: No scleral icterus.    Conjunctiva/sclera: Conjunctivae normal.     Pupils: Pupils are equal, round, and reactive to light.  Cardiovascular:     Rate and Rhythm: Normal rate and regular rhythm.     Heart sounds: Normal heart sounds. No murmur. No friction rub. No gallop.   Pulmonary:     Effort: Pulmonary effort is normal.     Breath sounds: Normal breath sounds. No wheezing or rales.  Musculoskeletal:     Right shoulder: Tenderness (over lateral upper arm) present. No swelling, deformity or bony tenderness. Normal range of motion. Normal strength. Normal pulse.     Cervical back: Neck supple.     Comments: Negative drop arm, hawkins, neers  Skin:    General: Skin is warm and dry.  Neurological:     Mental Status: She is alert and oriented to person, place, and time.     No results found for this or any previous visit (from the past 24  hour(s)).  No results found.   ASSESSMENT and PLAN  1. Essential hypertension Controlled. Continue current regime.  2. H/O hypokalemia Checking labs today, medications will be adjusted as needed.  - Basic Metabolic Panel  3. Postmenopausal estrogen deficiency - DG Bone Density; Future  4. Acute pain of right shoulder Discussed conservative measures. Consider referral to sports medicine if persist or worsens  Return in about 6 months (around 06/24/2020).    Myles Lipps, MD Primary Care at [facility] [address] [place], [place] [address] Ph.  [phone] Fax [phone]

## 2019-12-26 NOTE — Patient Instructions (Signed)
Trial of ice and rest for right shoulder. Continue use of muscle relaxant, tylenol or ibuprofen as needed. Please let me know if you would like a referral to sports medicine for more definitive diagnosis.

## 2019-12-27 ENCOUNTER — Other Ambulatory Visit: Payer: Self-pay | Admitting: Family Medicine

## 2019-12-27 NOTE — Telephone Encounter (Signed)
Requested medication (s) are due for refill today: Yes  Requested medication (s) are on the active medication list: Yes  Last refill:  10/26/19  Future visit scheduled: OV 12/26/19  Notes to clinic:  See request.    Requested Prescriptions  Pending Prescriptions Disp Refills   topiramate (TOPAMAX) 100 MG tablet [Pharmacy Med Name: TOPIRAMATE  100MG   TAB] 180 tablet 3    Sig: TAKE 1 TABLET BY MOUTH  TWICE DAILY      Not Delegated - Neurology: Anticonvulsants - topiramate & zonisamide Failed - 12/27/2019  7:35 AM      Failed - This refill cannot be delegated      Failed - Cr in normal range and within 360 days    Creat  Date Value Ref Range Status  05/16/2016 0.87 0.50 - 0.99 mg/dL Final    Comment:      For patients > or = 66 years of age: The upper reference limit for Creatinine is approximately 13% higher for people identified as African-American.      Creatinine, Ser  Date Value Ref Range Status  12/26/2019 1.09 (H) 0.57 - 1.00 mg/dL Final          Passed - CO2 in normal range and within 360 days    CO2  Date Value Ref Range Status  12/26/2019 21 20 - 29 mmol/L Final          Passed - Valid encounter within last 12 months    Recent Outpatient Visits           Yesterday Essential hypertension   Primary Care at 12/28/2019, Oneita Jolly, MD   4 weeks ago Essential hypertension   [facility] at [facility], Oneita Jolly, MD   1 month ago Hypotension, unspecified hypotension type   Primary [facility] at [facility], Oneita Jolly, MD   5 months ago Persistent recurrent vomiting   Primary Care at [facility], Oneita Jolly, MD   10 months ago Essential hypertension   Primary Care at [facility], Oneita Jolly, MD

## 2020-01-15 ENCOUNTER — Other Ambulatory Visit: Payer: Self-pay | Admitting: Family Medicine

## 2020-01-15 DIAGNOSIS — Z1231 Encounter for screening mammogram for malignant neoplasm of breast: Secondary | ICD-10-CM

## 2020-01-22 ENCOUNTER — Ambulatory Visit (INDEPENDENT_AMBULATORY_CARE_PROVIDER_SITE_OTHER): Payer: Medicare PPO | Admitting: Family Medicine

## 2020-01-22 VITALS — BP 128/88 | Ht 62.0 in | Wt 174.0 lb

## 2020-01-22 DIAGNOSIS — Z0000 Encounter for general adult medical examination without abnormal findings: Secondary | ICD-10-CM

## 2020-01-22 NOTE — Patient Instructions (Addendum)
Thank you for taking time to come for your Medicare Wellness Visit. I appreciate your ongoing commitment to your health goals. Please review the following plan we discussed and let me know if I can assist you in the future.  Bliss Tsang LPN  Preventive Care 65 Years and Older, Female Preventive care refers to lifestyle choices and visits with your health care provider that can promote health and wellness. This includes:  A yearly physical exam. This is also called an annual well check.  Regular dental and eye exams.  Immunizations.  Screening for certain conditions.  Healthy lifestyle choices, such as diet and exercise. What can I expect for my preventive care visit? Physical exam Your health care provider will check:  Height and weight. These may be used to calculate body mass index (BMI), which is a measurement that tells if you are at a healthy weight.  Heart rate and blood pressure.  Your skin for abnormal spots. Counseling Your health care provider may ask you questions about:  Alcohol, tobacco, and drug use.  Emotional well-being.  Home and relationship well-being.  Sexual activity.  Eating habits.  History of falls.  Memory and ability to understand (cognition).  Work and work environment.  Pregnancy and menstrual history. What immunizations do I need?  Influenza (flu) vaccine  This is recommended every year. Tetanus, diphtheria, and pertussis (Tdap) vaccine  You may need a Td booster every 10 years. Varicella (chickenpox) vaccine  You may need this vaccine if you have not already been vaccinated. Zoster (shingles) vaccine  You may need this after age 60. Pneumococcal conjugate (PCV13) vaccine  One dose is recommended after age 67. Pneumococcal polysaccharide (PPSV23) vaccine  One dose is recommended after age 67. Measles, mumps, and rubella (MMR) vaccine  You may need at least one dose of MMR if you were born in 1957 or later. You may also  need a second dose. Meningococcal conjugate (MenACWY) vaccine  You may need this if you have certain conditions. Hepatitis A vaccine  You may need this if you have certain conditions or if you travel or work in places where you may be exposed to hepatitis A. Hepatitis B vaccine  You may need this if you have certain conditions or if you travel or work in places where you may be exposed to hepatitis B. Haemophilus influenzae type b (Hib) vaccine  You may need this if you have certain conditions. You may receive vaccines as individual doses or as more than one vaccine together in one shot (combination vaccines). Talk with your health care provider about the risks and benefits of combination vaccines. What tests do I need? Blood tests  Lipid and cholesterol levels. These may be checked every 5 years, or more frequently depending on your overall health.  Hepatitis C test.  Hepatitis B test. Screening  Lung cancer screening. You may have this screening every year starting at age 55 if you have a 30-pack-year history of smoking and currently smoke or have quit within the past 15 years.  Colorectal cancer screening. All adults should have this screening starting at age 50 and continuing until age 75. Your health care provider may recommend screening at age 45 if you are at increased risk. You will have tests every 1-10 years, depending on your results and the type of screening test.  Diabetes screening. This is done by checking your blood sugar (glucose) after you have not eaten for a while (fasting). You may have this done every 1-3   years.  Mammogram. This may be done every 1-2 years. Talk with your health care provider about how often you should have regular mammograms.  BRCA-related cancer screening. This may be done if you have a family history of breast, ovarian, tubal, or peritoneal cancers. Other tests  Sexually transmitted disease (STD) testing.  Bone density scan. This is done  to screen for osteoporosis. You may have this done starting at age 94. Follow these instructions at home: Eating and drinking  Eat a diet that includes fresh fruits and vegetables, whole grains, lean protein, and low-fat dairy products. Limit your intake of foods with high amounts of sugar, saturated fats, and salt.  Take vitamin and mineral supplements as recommended by your health care provider.  Do not drink alcohol if your health care provider tells you not to drink.  If you drink alcohol: ? Limit how much you have to 0-1 drink a day. ? Be aware of how much alcohol is in your drink. In the [place] one drink equals one 12 oz bottle of beer (355 mL), one 5 oz glass of wine (148 mL), or one 1 oz glass of hard liquor (44 mL). Lifestyle  Take daily care of your teeth and gums.  Stay active. Exercise for at least 30 minutes on 5 or more days each week.  Do not use any products that contain nicotine or tobacco, such as cigarettes, e-cigarettes, and chewing tobacco. If you need help quitting, ask your health care provider.  If you are sexually active, practice safe sex. Use a condom or other form of protection in order to prevent STIs (sexually transmitted infections).  Talk with your health care provider about taking a low-dose aspirin or statin. What's next?  Go to your health care provider once a year for a well check visit.  Ask your health care provider how often you should have your eyes and teeth checked.  Stay up to date on all vaccines. This information is not intended to replace advice given to you by your health care provider. Make sure you discuss any questions you have with your health care provider. Document Revised: 10/20/2018 Document Reviewed: 10/20/2018 Elsevier Patient Education  2020 [employer].

## 2020-01-22 NOTE — Progress Notes (Addendum)
Presents today for [employer] Visit   Date of last exam: 12/26/2019  Interpreter used for this visit? No  I connected with  Kelli Contreras on 01/22/20 by a telephone and verified that I am speaking with the correct person using two identifiers.   I discussed the limitations of evaluation and management by telemedicine. The patient expressed understanding and agreed to proceed.    Patient Care Team: Myles Lipps, MD as PCP - General (Family Medicine)   Other items to address today:   Discussed immunizations patient declines  Discussed eye/dental  Other Screening: Last screening for diabetes: 11/21/2019  Last lipid screening: 02/18/2015  ADVANCE DIRECTIVES: Discussed: yes On File: no Materials Provided:  yes  Immunization status:  Immunization History  Administered Date(s) Administered  . Tdap 02/11/2017     Health Maintenance Due  Topic Date Due  . DEXA SCAN  Never done  . MAMMOGRAM  08/31/2019     Functional Status Survey: Is the patient deaf or have difficulty hearing?: No Does the patient have difficulty seeing, even when wearing glasses/contacts?: No Does the patient have difficulty concentrating, remembering, or making decisions?: Yes(sometimes) Does the patient have difficulty walking or climbing stairs?: No Does the patient have difficulty dressing or bathing?: No Does the patient have difficulty doing errands alone such as visiting a doctor's office or shopping?: No   6CIT Screen 01/22/2020  What Year? 0 points  What month? 0 points  What time? 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   Assistant teacher   Patient Active Problem List   Diagnosis Date Noted  . H/O hypokalemia 07/25/2019  . Persistent recurrent vomiting 07/25/2019  . Dyspnea on exertion 01/14/2018  . Chronic migraine without aura without status migrainosus, not intractable 06/07/2016  . Gastroesophageal reflux disease without esophagitis 06/07/2016  . Precordial pain 09/28/2012  . Chest pain 08/30/2012  . Insomnia 04/14/2010  . PLANTAR FASCIITIS 04/23/2008  . Essential hypertension 12/01/2007     Past Medical History:  Diagnosis Date  . Cataract    bil cataracts removed  . Depression   . Diarrhea   . Hypertension   . IBS (irritable bowel syndrome)    diarrhea  . Insomnia   . Migraine   . Plantar fasciitis      Past Surgical History:  Procedure Laterality Date  . ABDOMINAL HYSTERECTOMY     DUB; ovaries intact.  Marland Kitchen BREAST CYST ASPIRATION Bilateral   . BUNIONECTOMY  2007   Dr. Lestine Box  . VESICOVAGINAL FISTULA CLOSURE W/ TAH  1985   Dr. Nicholas Lose     Family History  Problem Relation Age of Onset  . CAD Father 17  . Cancer Father        bladder cancer  . Diabetes Father   . Heart disease Father   .  Hyperlipidemia Father   . Hypertension Father   . Valvular heart disease Mother        MVP  . Heart disease Mother   . Hyperlipidemia Mother   . Hypertension Mother   . Hypertension Brother   . Hypertension Other        mother, father, brother  . Breast cancer Maternal Aunt   . Colon cancer Neg Hx   . Esophageal cancer Neg Hx   . Rectal cancer Neg Hx   . Stomach cancer Neg Hx      Social History   Socioeconomic History  . Marital status: Widowed    Spouse name: Not on file  . Number of children: 2  . Years of education: Not on file  . Highest education level:  Not on file  Occupational History  . Not on file  Tobacco Use  . Smoking status: Never Smoker  . Smokeless tobacco: Never Used  Substance and Sexual Activity  . Alcohol use: No  . Drug use: Never  . Sexual activity: Not on file  Other Topics Concern  . Not on file  Social History Narrative         Social Determinants of Health   Financial Resource Strain:   . Difficulty of Paying Living Expenses:   Food Insecurity:   . Worried About Programme researcher, broadcasting/film/video in the Last Year:   . Barista in the Last Year:   Transportation Needs:   . Freight forwarder (Medical):   Marland Kitchen Lack of Transportation (Non-Medical):   Physical Activity:   . Days of Exercise per Week:   . Minutes of Exercise per Session:   Stress:   . Feeling of Stress :   Social Connections:   . Frequency of Communication with Friends and Family:   . Frequency of Social Gatherings with Friends and Family:   . Attends Religious Services:   . Active Member of Clubs or Organizations:   . Attends Banker Meetings:   Marland Kitchen Marital Status:   Intimate Partner Violence:   . Fear of Current or Ex-Partner:   . Emotionally Abused:   Marland Kitchen Physically Abused:   . Sexually Abused:      Allergies  Allergen Reactions  . Other Other (See Comments)    Onions & Chocolate cause migraine     Prior to Admission medications   Medication Sig Start Date End Date Taking? Authorizing Provider  aspirin 81 MG tablet Take 81 mg by mouth at bedtime.    Yes [provider]  DULoxetine (CYMBALTA) 30 MG capsule TAKE 1 CAPSULE BY MOUTH  DAILY 09/14/19  Yes Myles Lipps, MD  imipramine (TOFRANIL) 50 MG tablet TAKE 2 TABLETS BY MOUTH AT  BEDTIME 11/28/19  Yes Myles Lipps, MD  lisinopril (ZESTRIL) 20 MG tablet TAKE 1 TABLET BY MOUTH  DAILY 09/14/19  Yes Myles Lipps, MD  meloxicam (MOBIC) 15 MG tablet Take 1 tablet (15 mg total) by mouth daily. Patient taking differently: Take 15 mg by mouth daily. PRN 05/30/18   Yes Ethelda Chick, MD  methocarbamol (ROBAXIN) 500 MG tablet Take 1 tablet (500 mg total) by mouth every 8 (eight) hours as needed for muscle spasms. Patient taking differently: Take 500 mg by mouth every 8 (eight) hours as needed for muscle spasms. PRN 07/25/19  Yes Myles Lipps, MD  metoprolol succinate (TOPROL-XL) 50 MG 24 hr tablet TAKE 1 TABLET BY MOUTH  DAILY WITH OR IMMEDIATELY  FOLLOWING A MEAL 09/14/19  Yes Rutherford Guys, MD  pantoprazole (PROTONIX) 40 MG tablet Take 1 tablet (40 mg total) by mouth daily. 08/31/19  Yes Irene Shipper, MD  potassium chloride SA (KLOR-CON M20) 20 MEQ tablet Take 1 tablet (20 mEq total) by mouth daily. Needs in office visit for future refills Patient taking differently: Take 20 mEq by mouth daily. Needs in office visit for future refills PRN 11/16/19  Yes Rutherford Guys, MD  topiramate (TOPAMAX) 100 MG tablet TAKE 1 TABLET BY MOUTH  TWICE DAILY 01/03/20  Yes Rutherford Guys, MD  zolpidem (AMBIEN) 5 MG tablet Take 1 tablet (5 mg total) by mouth at bedtime as needed. 12/11/17  Yes Wardell Honour, MD     Depression screen [facility] 2/9 01/22/2020 12/26/2019 12/26/2019 11/28/2019 11/21/2019  Decreased Interest 0 0 0 0 0  Down, Depressed, Hopeless 0 0 0 0 0  PHQ - 2 Score 0 0 0 0 0     Fall Risk  01/22/2020 12/26/2019 12/26/2019 11/28/2019 11/21/2019  Falls in the past year? 0 0 0 0 0  Number falls in past yr: 0 0 0 0 0  Injury with Fall? 0 0 0 0 0  Follow up Falls evaluation completed;Education provided - - - -      PHYSICAL EXAM: BP 128/88 Comment: not in clinic  Ht 5\' 2"  (1.575 m)   Wt 174 lb (78.9 kg)   BMI 31.83 kg/m    Wt Readings from Last 3 Encounters:  01/22/20 174 lb (78.9 kg)  12/26/19 174 lb (78.9 kg)  11/28/19 177 lb (80.3 kg)    Medicare annual wellness visit, subsequent    Education/Counseling provided regarding diet and exercise, prevention of chronic diseases, smoking/tobacco cessation, if applicable, and reviewed "Covered  Medicare Preventive Services."      I have reviewed and agree with the above AWV documentation. Irma. Reubin Milan, MD

## 2020-02-27 ENCOUNTER — Emergency Department (HOSPITAL_COMMUNITY): Payer: Medicare PPO

## 2020-02-27 ENCOUNTER — Ambulatory Visit (INDEPENDENT_AMBULATORY_CARE_PROVIDER_SITE_OTHER): Payer: Medicare PPO

## 2020-02-27 ENCOUNTER — Ambulatory Visit: Payer: Medicare PPO | Admitting: Family Medicine

## 2020-02-27 ENCOUNTER — Other Ambulatory Visit: Payer: Self-pay

## 2020-02-27 ENCOUNTER — Emergency Department (HOSPITAL_COMMUNITY)
Admission: EM | Admit: 2020-02-27 | Discharge: 2020-02-28 | Disposition: A | Discharge status: Home / Self Care | Payer: Medicare PPO | Attending: Emergency Medicine | Admitting: Emergency Medicine

## 2020-02-27 ENCOUNTER — Encounter (HOSPITAL_COMMUNITY): Payer: Self-pay | Admitting: Emergency Medicine

## 2020-02-27 ENCOUNTER — Encounter: Payer: Self-pay | Admitting: Family Medicine

## 2020-02-27 VITALS — BP 156/96 | HR 100 | Temp 97.9°F | Ht 62.0 in | Wt 167.6 lb

## 2020-02-27 DIAGNOSIS — R0902 Hypoxemia: Secondary | ICD-10-CM | POA: Insufficient documentation

## 2020-02-27 DIAGNOSIS — Z7982 Long term (current) use of aspirin: Secondary | ICD-10-CM | POA: Diagnosis not present

## 2020-02-27 DIAGNOSIS — I10 Essential (primary) hypertension: Secondary | ICD-10-CM

## 2020-02-27 DIAGNOSIS — R112 Nausea with vomiting, unspecified: Secondary | ICD-10-CM | POA: Diagnosis not present

## 2020-02-27 DIAGNOSIS — R0609 Other forms of dyspnea: Secondary | ICD-10-CM

## 2020-02-27 DIAGNOSIS — R0602 Shortness of breath: Secondary | ICD-10-CM | POA: Diagnosis not present

## 2020-02-27 DIAGNOSIS — J9601 Acute respiratory failure with hypoxia: Secondary | ICD-10-CM | POA: Diagnosis not present

## 2020-02-27 DIAGNOSIS — R0600 Dyspnea, unspecified: Secondary | ICD-10-CM

## 2020-02-27 DIAGNOSIS — Z8616 Personal history of COVID-19: Secondary | ICD-10-CM | POA: Insufficient documentation

## 2020-02-27 DIAGNOSIS — R0789 Other chest pain: Secondary | ICD-10-CM | POA: Insufficient documentation

## 2020-02-27 DIAGNOSIS — R000 Tachycardia, unspecified: Secondary | ICD-10-CM | POA: Diagnosis not present

## 2020-02-27 LAB — TROPONIN I (HIGH SENSITIVITY)
Troponin I (High Sensitivity): 14 ng/L (ref <18)
Troponin I (High Sensitivity): 9 ng/L (ref <18)

## 2020-02-27 LAB — CBC
HCT: 48 % — ABNORMAL HIGH (ref 36.0–46.0)
Hemoglobin: 15.7 g/dL — ABNORMAL HIGH (ref 12.0–15.0)
MCH: 28.9 pg (ref 26.0–34.0)
MCHC: 32.7 g/dL (ref 30.0–36.0)
MCV: 88.2 fL (ref 80.0–100.0)
Platelets: 198 10*3/uL (ref 150–400)
RBC: 5.44 MIL/uL — ABNORMAL HIGH (ref 3.87–5.11)
RDW: 13.5 % (ref 11.5–15.5)
WBC: 5.9 10*3/uL (ref 4.0–10.5)
nRBC: 0 % (ref 0.0–0.2)

## 2020-02-27 LAB — BASIC METABOLIC PANEL
Anion gap: 11 (ref 5–15)
BUN: 19 mg/dL (ref 8–23)
CO2: 25 mmol/L (ref 22–32)
Calcium: 9.3 mg/dL (ref 8.9–10.3)
Chloride: 105 mmol/L (ref 98–111)
Creatinine, Ser: 1.2 mg/dL — ABNORMAL HIGH (ref 0.44–1.00)
GFR calc Af Amer: 55 mL/min — ABNORMAL LOW (ref >60)
GFR calc non Af Amer: 47 mL/min — ABNORMAL LOW (ref >60)
Glucose, Bld: 94 mg/dL (ref 70–99)
Potassium: 3.9 mmol/L (ref 3.5–5.1)
Sodium: 141 mmol/L (ref 135–145)

## 2020-02-27 MED ORDER — SODIUM CHLORIDE 0.9% FLUSH: 3.0000 mL | Freq: Once | INTRAVENOUS | Status: DC

## 2020-02-27 MED ORDER — HYDROCHLOROTHIAZIDE 25 MG PO TABS: 25.0000 mg | ORAL_TABLET | Freq: Every day | ORAL | 3 refills | Status: DC

## 2020-02-27 MED ORDER — LISINOPRIL 20 MG PO TABS: 20.0000 mg | ORAL_TABLET | Freq: Every day | ORAL | 1 refills | Status: DC

## 2020-02-27 NOTE — Patient Instructions (Signed)
° ° ° °  If you have lab work done today you will be contacted with your lab results within the next 2 weeks.  If you have not heard from us then please contact us. The fastest way to get your results is to register for My Chart. ° ° °IF you received an x-ray today, you will receive an invoice from [facility]. Please contact [facility] at [phone] with questions or concerns regarding your invoice.  ° °IF you received labwork today, you will receive an invoice from [facility]. Please contact [facility] at [phone] with questions or concerns regarding your invoice.  ° °Our billing staff will not be able to assist you with questions regarding bills from these companies. ° °You will be contacted with the lab results as soon as they are available. The fastest way to get your results is to activate your My Chart account. Instructions are located on the last page of this paperwork. If you have not heard from us regarding the results in 2 weeks, please contact this office. °  ° ° ° °

## 2020-02-27 NOTE — ED Notes (Signed)
Pt ambulated approx. 40', sats at 94-98 until entrance of room where sats dropped to 90. Pt did not appear to have labored breathing.

## 2020-02-27 NOTE — Progress Notes (Signed)
4/20/202112:04 PM  Kelli Contreras Oct 27, 1951, 69 y.o., female [id number]  Chief Complaint  Patient presents with  . Hypertension    pt has been having high readings at home. started bk taking the bp med she was taken off of hctz. Has log of bps. She is also having high pulse readings    HPI:   Patient is a 69 y.o. female with past medical history significant for HTN, hypokalemia, insomnia, IBS with diarrhea, arthritis, depression, anxiety and migraineswho presents today for followup on BP  Last OV feb 2021 - no changes HCTZ was stopped in jan 2021 after having hypotension in setting of being sick with covid 19   Patient reports that she has recently not been feeling well Having DOE, chest tightness with most minimal of activities, such as showering She had noticed increase in BP so she restarted her HCTZ She continues to take lisinopril and metoprolol She has also noticed increased in HR when she checks her BP, she however does not perceive palpitations She denies any fever, chills, cough, orthopnea, PND or edema She continues to have occ nausea with vomiting, she denies any abd pain, constipation, diarrhea, melena or blood in stool Her appetite has been decreased  Lab Results  Component Value Date   CREATININE 1.09 (H) 12/26/2019   BUN 19 12/26/2019   NA 142 12/26/2019   K 4.0 12/26/2019   CL 110 (H) 12/26/2019   CO2 21 12/26/2019    Depression screen PHQ 2/9 02/27/2020 01/22/2020 12/26/2019  Decreased Interest 0 0 0  Down, Depressed, Hopeless 0 0 0  PHQ - 2 Score 0 0 0    Fall Risk  02/27/2020 01/22/2020 12/26/2019 12/26/2019 11/28/2019  Falls in the past year? 0 0 0 0 0  Number falls in past yr: 0 0 0 0 0  Injury with Fall? 0 0 0 0 0  Follow up - Falls evaluation completed;Education provided - - -     Allergies  Allergen Reactions  . Other Other (See Comments)    Onions & Chocolate cause migraine    Prior to Admission medications   Medication Sig Start Date  End Date Taking? Authorizing Provider  aspirin 81 MG tablet Take 81 mg by mouth at bedtime.    Yes [provider]  DULoxetine (CYMBALTA) 30 MG capsule TAKE 1 CAPSULE BY MOUTH  DAILY 09/14/19  Yes Rutherford Guys, MD  imipramine (TOFRANIL) 50 MG tablet TAKE 2 TABLETS BY MOUTH AT  BEDTIME 11/28/19  Yes Rutherford Guys, MD  lisinopril (ZESTRIL) 20 MG tablet TAKE 1 TABLET BY MOUTH  DAILY 09/14/19  Yes Rutherford Guys, MD  meloxicam (MOBIC) 15 MG tablet Take 1 tablet (15 mg total) by mouth daily. Patient taking differently: Take 15 mg by mouth daily. PRN 05/30/18  Yes Wardell Honour, MD  methocarbamol (ROBAXIN) 500 MG tablet Take 1 tablet (500 mg total) by mouth every 8 (eight) hours as needed for muscle spasms. Patient taking differently: Take 500 mg by mouth every 8 (eight) hours as needed for muscle spasms. PRN 07/25/19  Yes Rutherford Guys, MD  metoprolol succinate (TOPROL-XL) 50 MG 24 hr tablet TAKE 1 TABLET BY MOUTH  DAILY WITH OR IMMEDIATELY  FOLLOWING A MEAL 09/14/19  Yes Rutherford Guys, MD  pantoprazole (PROTONIX) 40 MG tablet Take 1 tablet (40 mg total) by mouth daily. 08/31/19  Yes Irene Shipper, MD  potassium chloride SA (KLOR-CON M20) 20 MEQ tablet Take 1 tablet (20  mEq total) by mouth daily. Needs in office visit for future refills Patient taking differently: Take 20 mEq by mouth daily. Needs in office visit for future refills PRN 11/16/19  Yes Rutherford Guys, MD  topiramate (TOPAMAX) 100 MG tablet TAKE 1 TABLET BY MOUTH  TWICE DAILY 01/03/20  Yes Rutherford Guys, MD  zolpidem (AMBIEN) 5 MG tablet Take 1 tablet (5 mg total) by mouth at bedtime as needed. 12/11/17  Yes Wardell Honour, MD    Past Medical History:  Diagnosis Date  . Cataract    bil cataracts removed  . Depression   . Diarrhea   . Hypertension   . IBS (irritable bowel syndrome)    diarrhea  . Insomnia   . Migraine   . Plantar fasciitis     Past Surgical History:  Procedure Laterality Date  . ABDOMINAL  HYSTERECTOMY     DUB; ovaries intact.  Marland Kitchen BREAST CYST ASPIRATION Bilateral   . BUNIONECTOMY  2007   Dr. Beola Cord  . VESICOVAGINAL FISTULA CLOSURE W/ TAH  1985   Dr. Ubaldo Glassing    Social History   Tobacco Use  . Smoking status: Never Smoker  . Smokeless tobacco: Never Used  Substance Use Topics  . Alcohol use: No    Family History  Problem Relation Age of Onset  . CAD Father 88  . Cancer Father        bladder cancer  . Diabetes Father   . Heart disease Father   . Hyperlipidemia Father   . Hypertension Father   . Valvular heart disease Mother        MVP  . Heart disease Mother   . Hyperlipidemia Mother   . Hypertension Mother   . Hypertension Brother   . Hypertension Other        mother, father, brother  . Breast cancer Maternal Aunt   . Colon cancer Neg Hx   . Esophageal cancer Neg Hx   . Rectal cancer Neg Hx   . Stomach cancer Neg Hx     ROS Per hpi  OBJECTIVE:  Today's Vitals   02/27/20 1201  BP: (!) 156/96  Pulse: 100  Temp: 97.9 F (36.6 C)  SpO2: 100%  Weight: 167 lb 9.6 oz (76 kg)  Height: _0  (1.575 m)   Body mass index is 30.65 kg/m.   Physical Exam Vitals and nursing note reviewed.  Constitutional:      Appearance: She is well-developed.  HENT:     Head: Normocephalic and atraumatic.     Mouth/Throat:     Pharynx: No oropharyngeal exudate.  Eyes:     General: No scleral icterus.    Conjunctiva/sclera: Conjunctivae normal.     Pupils: Pupils are equal, round, and reactive to light.  Neck:     Vascular: No JVD.  Cardiovascular:     Rate and Rhythm: Normal rate and regular rhythm.     Heart sounds: Normal heart sounds. No murmur. No friction rub. No gallop.   Pulmonary:     Effort: Pulmonary effort is normal.     Breath sounds: Normal breath sounds. No wheezing, rhonchi or rales.  Abdominal:     General: Bowel sounds are normal. There is no distension.     Palpations: Abdomen is soft.     Tenderness: There is no abdominal tenderness.   Musculoskeletal:     Cervical back: Neck supple.     Right lower leg: No edema.     Left lower leg:  No edema.  Skin:    General: Skin is warm and dry.  Neurological:     Mental Status: She is alert and oriented to person, place, and time.    My interpretation of EKG:  Sinus tach, HR 106, no new ST changes compared to Jan 2021  6 minute walk: O2 to 84% on RA after 3 minutes with HR 127, associated peripheral cyanosis of hands and feet  Started her on O2 2 L via [place], O2 sats 98%, HR 66  No results found for this or any previous visit (from the past 24 hour(s)).  No results found.   ASSESSMENT and PLAN  1. DOE (dyspnea on exertion) 2. Acute respiratory failure with hypoxia ([place]) Patient with new onset of hypoxia with activity and tachycardia. S/p recent covid infection. Concern for VTE vs ischemia. ER evaluation warranted. Given abnormal vital signs, sending via EMS so that she may be monitored and managed during transport.  - CBC - D-dimer, quantitative (not at [facility]) - EKG 12-Lead - DG Chest 2 View - official read pending. Per my read no acute findings   3. Intractable vomiting with nausea, unspecified vomiting type - Lipase  4. Essential hypertension - CMP14+EGFR - Lipid panel  Other orders - lisinopril (ZESTRIL) 20 MG tablet; Take 1 tablet (20 mg total) by mouth daily. - hydrochlorothiazide (HYDRODIURIL) 25 MG tablet; Take 1 tablet (25 mg total) by mouth daily.  No follow-ups on file.    Rutherford Guys, MD [facility] at [facility] [place], [place] [address] Ph.  [phone] Fax [phone]

## 2020-02-27 NOTE — ED Triage Notes (Signed)
Pt went to MD today for SOB/ HTN that has been going on for the past week. Pt had a 6 min stress test in the office, no EKG changes, but spo2 went to 82% on room air during the test. Pt's HR 120. Pt is now on room air at 98%. EMS gave of fluid, and HR has improved to 100. Pt told EMS she feels normal at this time.

## 2020-02-28 ENCOUNTER — Telehealth: Payer: Self-pay | Admitting: Family Medicine

## 2020-02-28 LAB — LIPID PANEL
Chol/HDL Ratio: 3.7 ratio (ref 0.0–4.4)
Cholesterol, Total: 197 mg/dL (ref 100–199)
HDL: 53 mg/dL (ref >39)
LDL Chol Calc (NIH): 126 mg/dL — ABNORMAL HIGH (ref 0–99)
Triglycerides: 100 mg/dL (ref 0–149)
VLDL Cholesterol Cal: 18 mg/dL (ref 5–40)

## 2020-02-28 LAB — CMP14+EGFR
ALT: 11 IU/L (ref 0–32)
AST: 17 IU/L (ref 0–40)
Albumin/Globulin Ratio: 1.8 (ref 1.2–2.2)
Albumin: 4.5 g/dL (ref 3.8–4.8)
Alkaline Phosphatase: 123 IU/L — ABNORMAL HIGH (ref 39–117)
BUN/Creatinine Ratio: 19 (ref 12–28)
BUN: 23 mg/dL (ref 8–27)
Bilirubin Total: 0.5 mg/dL (ref 0.0–1.2)
CO2: 23 mmol/L (ref 20–29)
Calcium: 9.7 mg/dL (ref 8.7–10.3)
Chloride: 101 mmol/L (ref 96–106)
Creatinine, Ser: 1.24 mg/dL — ABNORMAL HIGH (ref 0.57–1.00)
GFR calc Af Amer: 52 mL/min/{1.73_m2} — ABNORMAL LOW (ref >59)
GFR calc non Af Amer: 45 mL/min/{1.73_m2} — ABNORMAL LOW (ref >59)
Globulin, Total: 2.5 g/dL (ref 1.5–4.5)
Glucose: 91 mg/dL (ref 65–99)
Potassium: 3.6 mmol/L (ref 3.5–5.2)
Sodium: 142 mmol/L (ref 134–144)
Total Protein: 7 g/dL (ref 6.0–8.5)

## 2020-02-28 LAB — CBC
Hematocrit: 49.8 % — ABNORMAL HIGH (ref 34.0–46.6)
Hemoglobin: 16.9 g/dL — ABNORMAL HIGH (ref 11.1–15.9)
MCH: 30.1 pg (ref 26.6–33.0)
MCHC: 33.9 g/dL (ref 31.5–35.7)
MCV: 89 fL (ref 79–97)
Platelets: 222 10*3/uL (ref 150–450)
RBC: 5.61 x10E6/uL — ABNORMAL HIGH (ref 3.77–5.28)
RDW: 13.1 % (ref 11.7–15.4)
WBC: 6.7 10*3/uL (ref 3.4–10.8)

## 2020-02-28 LAB — D-DIMER, QUANTITATIVE
D-DIMER: 0.29 mg/L FEU (ref 0.00–0.49)
D-Dimer, Quant: 0.44 ug/mL-FEU (ref 0.00–0.50)

## 2020-02-28 NOTE — Discharge Instructions (Addendum)
No clear cause of your low oxygen level was discovered tonight.  Your oxygen level remained and adequate levels even with ambulating tonight.   We recommend that you follow-up with your doctor in the next couple of days.  If your symptoms change or worsen, please return to the ER.

## 2020-02-28 NOTE — ED Notes (Signed)
Patient verbalizes understanding of discharge instructions. Opportunity for questioning and answers were provided. Armband removed by staff, pt discharged from ED.  

## 2020-02-28 NOTE — Telephone Encounter (Signed)
Pt called called because Doctor Suezanne Jacquet Send her to hospital her O2 was Low she still felling Bad and need some one to call her Back.Please Advice

## 2020-02-28 NOTE — ED Provider Notes (Signed)
[facility] EMERGENCY DEPARTMENT Provider Note   CSN: [id number] Arrival date & time: 02/27/20  1411     History No chief complaint on file.   Kelli Contreras is a 69 y.o. female.  Patient presents to the emergency department with a chief complaint of referral from secondary to hypoxia.  Patient states that she saw her doctor this morning for persistent shortness of breath and chest tightness which has been ongoing for the past couple of weeks.  She states that her doctor told her that her O2 saturation was low (82%) and sent her to the emergency department for evaluation.  Patient had coronavirus back in December, but completely recovered.  She denies any fever, chills, or cough.  Reports that she has shortness of breath when she stands up with associated chest tightness.  She denies any history of heart problems or of having had these symptoms before.  She denies any recent long travel or immobilization.  Denies any lower extremity swelling.  She is not anticoagulated.  PCP sent her to the ED for evaluation of PE versus ischemia as the cause of her symptoms.  The history is provided by the patient. No language interpreter was used.       Past Medical History:  Diagnosis Date  . Cataract    bil cataracts removed  . Depression   . Diarrhea   . Hypertension   . IBS (irritable bowel syndrome)    diarrhea  . Insomnia   . Migraine   . Plantar fasciitis     Patient Active Problem List   Diagnosis Date Noted  . H/O hypokalemia 07/25/2019  . Persistent recurrent vomiting 07/25/2019  . Dyspnea on exertion 01/14/2018  . Chronic migraine without aura without status migrainosus, not intractable 06/07/2016  . Gastroesophageal reflux disease without esophagitis 06/07/2016  . Precordial pain 09/28/2012  . Chest pain 08/30/2012  . Insomnia 04/14/2010  . PLANTAR FASCIITIS 04/23/2008  . Essential hypertension 12/01/2007    Past Surgical History:  Procedure  Laterality Date  . ABDOMINAL HYSTERECTOMY     DUB; ovaries intact.  Marland Kitchen BREAST CYST ASPIRATION Bilateral   . BUNIONECTOMY  2007   Dr. Beola Cord  . VESICOVAGINAL FISTULA CLOSURE W/ TAH  1985   Dr. Ubaldo Glassing     OB History   No obstetric history on file.     Family History  Problem Relation Age of Onset  . CAD Father 28  . Cancer Father        bladder cancer  . Diabetes Father   . Heart disease Father   . Hyperlipidemia Father   . Hypertension Father   . Valvular heart disease Mother        MVP  . Heart disease Mother   . Hyperlipidemia Mother   . Hypertension Mother   . Hypertension Brother   . Hypertension Other        mother, father, brother  . Breast cancer Maternal Aunt   . Colon cancer Neg Hx   . Esophageal cancer Neg Hx   . Rectal cancer Neg Hx   . Stomach cancer Neg Hx     Social History   Tobacco Use  . Smoking status: Never Smoker  . Smokeless tobacco: Never Used  Substance Use Topics  . Alcohol use: No  . Drug use: Never    Home Medications Prior to Admission medications   Medication Sig Start Date End Date Taking? Authorizing Provider  aspirin 81 MG tablet Take 81 mg  by mouth at bedtime.     [provider]  DULoxetine (CYMBALTA) 30 MG capsule TAKE 1 CAPSULE BY MOUTH  DAILY 09/14/19   Myles Lipps, MD  hydrochlorothiazide (HYDRODIURIL) 25 MG tablet Take 1 tablet (25 mg total) by mouth daily. 02/27/20   Myles Lipps, MD  imipramine (TOFRANIL) 50 MG tablet TAKE 2 TABLETS BY MOUTH AT  BEDTIME 11/28/19   Myles Lipps, MD  lisinopril (ZESTRIL) 20 MG tablet Take 1 tablet (20 mg total) by mouth daily. 02/27/20   Myles Lipps, MD  meloxicam (MOBIC) 15 MG tablet Take 1 tablet (15 mg total) by mouth daily. Patient taking differently: Take 15 mg by mouth daily. PRN 05/30/18   Ethelda Chick, MD  methocarbamol (ROBAXIN) 500 MG tablet Take 1 tablet (500 mg total) by mouth every 8 (eight) hours as needed for muscle spasms. Patient taking  differently: Take 500 mg by mouth every 8 (eight) hours as needed for muscle spasms. PRN 07/25/19   Myles Lipps, MD  metoprolol succinate (TOPROL-XL) 50 MG 24 hr tablet TAKE 1 TABLET BY MOUTH  DAILY WITH OR IMMEDIATELY  FOLLOWING A MEAL 09/14/19   Myles Lipps, MD  pantoprazole (PROTONIX) 40 MG tablet Take 1 tablet (40 mg total) by mouth daily. 08/31/19   Hilarie Fredrickson, MD  potassium chloride SA (KLOR-CON M20) 20 MEQ tablet Take 1 tablet (20 mEq total) by mouth daily. Needs in office visit for future refills Patient taking differently: Take 20 mEq by mouth daily. Needs in office visit for future refills PRN 11/16/19   Myles Lipps, MD  topiramate (TOPAMAX) 100 MG tablet TAKE 1 TABLET BY MOUTH  TWICE DAILY 01/03/20   Myles Lipps, MD  zolpidem (AMBIEN) 5 MG tablet Take 1 tablet (5 mg total) by mouth at bedtime as needed. 12/11/17   Ethelda Chick, MD    Allergies    Other  Review of Systems   Review of Systems  All other systems reviewed and are negative.   Physical Exam Updated Vital Signs BP (!) 191/103 (BP Location: Right Arm)   Pulse (!) 101   Temp 98.3 F (36.8 C) (Oral)   Resp (!) 22   Ht 5\' 2"  (1.575 m)   Wt 75.8 kg   SpO2 97%   BMI 30.54 kg/m   Physical Exam Vitals and nursing note reviewed.  Constitutional:      General: She is not in acute distress.    Appearance: She is well-developed.  HENT:     Head: Normocephalic and atraumatic.  Eyes:     Conjunctiva/sclera: Conjunctivae normal.  Cardiovascular:     Rate and Rhythm: Normal rate and regular rhythm.     Heart sounds: No murmur.     Comments: Normal rate on my exam Pulmonary:     Effort: Pulmonary effort is normal. No respiratory distress.     Breath sounds: Normal breath sounds.     Comments: Lungs are clear to auscultation bilaterally Abdominal:     Palpations: Abdomen is soft.     Tenderness: There is no abdominal tenderness.  Musculoskeletal:        General: Normal range of motion.      Cervical back: Neck supple.     Comments: No leg swelling or calf tenderness  Skin:    General: Skin is warm and dry.  Neurological:     Mental Status: She is alert and oriented to person, place, and time.  Psychiatric:  Mood and Affect: Mood normal.        Behavior: Behavior normal.     ED Results / Procedures / Treatments   Labs (all labs ordered are listed, but only abnormal results are displayed) Labs Reviewed  BASIC METABOLIC PANEL - Abnormal; Notable for the following components:      Result Value   Creatinine, Ser 1.20 (*)    GFR calc non Af Amer 47 (*)    GFR calc Af Amer 55 (*)    All other components within normal limits  CBC - Abnormal; Notable for the following components:   RBC 5.44 (*)    Hemoglobin 15.7 (*)    HCT 48.0 (*)    All other components within normal limits  D-DIMER, QUANTITATIVE (NOT AT [facility])  TROPONIN I (HIGH SENSITIVITY)  TROPONIN I (HIGH SENSITIVITY)    EKG EKG Interpretation  Date/Time:  Tuesday February 27 2020 23:50:36 EDT Ventricular Rate:  101 PR Interval:  174 QRS Duration: 102 QT Interval:  352 QTC Calculation: 457 R Axis:   -30 Text Interpretation: Sinus tachycardia Left axis deviation Consider anterior infarct Confirmed by Zadie Rhine ([id number]) on 02/28/2020 12:12:20 AM   Radiology DG Chest 2 View  Result Date: 02/27/2020 CLINICAL DATA:  Dyspnea on exertion. EXAM: CHEST - 2 VIEW COMPARISON:  PA and lateral chest 01/14/2018. FINDINGS: Lungs clear. Heart size normal. No pneumothorax or pleural effusion. Atherosclerosis noted. No acute or focal bony abnormality. IMPRESSION: No acute disease. Atherosclerosis. Electronically Signed   By: Drusilla Kanner M.D.   On: 02/27/2020 13:36    Procedures Procedures (including critical care time)  Medications Ordered in ED Medications  sodium chloride flush (NS) 0.9 % injection 3 mL (has no administration in time range)    ED Course  I have reviewed the triage vital signs and the  nursing notes.  Pertinent labs & imaging results that were available during my care of the patient were reviewed by me and considered in my medical decision making (see chart for details).    MDM Rules/Calculators/A&P                      This patient complains of hypoxia, SOB and CP, this involves an extensive number of treatment options, and is a complaint that carries with it a high risk of complications and morbidity.  The differential diagnosis includes ischemia, PE, sequelae from recent COVID-19 infection.  I ordered, reviewed, and interpreted labs, which included troponins to assess for ischemia, with the 1st being 14 and the 2nd being 9.  Without ischemic evidence on EKG, I think she is low risk for ACS.  D-dimer is 0.44.  PE thought to be less likely. HGB is 15.7, likely hemeconcentrated, but not anemic.  Cr is 1.20, up from normal, could be due to dehydration.  Additional history obtained from PCP note from earlier today. Previous records obtained and reviewed show no hx of the same. I consulted with attending physician Dr. Bebe Shaggy and discussed pertinent lab and imaging findings.  Dr. Bebe Shaggy agreeable with discharge plan.    After the interventions stated above, I reevaluated the patient and found her stable for discharge with close outpatient follow-up.    Final Clinical Impression(s) / ED Diagnoses Final diagnoses:  Hypoxia  SOB (shortness of breath)  Chest tightness    Rx / DC Orders ED Discharge Orders    None       Roxy Horseman, PA-C 02/28/20 0059    Zadie Rhine,  MD 02/28/20 0104

## 2020-02-29 MED ORDER — POTASSIUM CHLORIDE CRYS ER 20 MEQ PO TBCR: 20.0000 meq | EXTENDED_RELEASE_TABLET | Freq: Every day | ORAL | 0 refills | Status: DC

## 2020-02-29 NOTE — Telephone Encounter (Signed)
Approached by Betzy asking can I forward a message to Dr. Santiago to remind her to call patient.  I accessed pt calls and found this message from pt on 02/28/2020 stating she was sent to hospital o2 low and she was still feeling bad and would like a call back.  I call pt to check to see how she was doing.  Pt advised she was feeling better but had just spoken to someone.  I advised I was sorry but there was no documentation in chart that anyone had previously spoken to her.  I told pt I was glad that she was feeling better and apologetic no response to her call on yesterday.  Pt advises she believes she was speaking with Dr. Santiago.  I spoke with Dr. Santiago and she did call pt to f/u.   I had an inbox and float person working pools yesterday and this was not addressed.         

## 2020-02-29 NOTE — Telephone Encounter (Signed)
Approached by Larose Hires asking can I forward a message to Dr. Leretha Pol to remind her to call patient.  I accessed pt calls and found this message from pt on 02/28/2020 stating she was sent to hospital o2 low and she was still feeling bad and would like a call back.  I call pt to check to see how she was doing.  Pt advised she was feeling better but had just spoken to someone.  I advised I was sorry but there was no documentation in chart that anyone had previously spoken to her.  I told pt I was glad that she was feeling better and apologetic no response to her call on yesterday.  Pt advises she believes she was speaking with Dr. Leretha Pol.  I spoke with Dr. Leretha Pol and she did call pt to f/u.   I had an inbox and float person working pools yesterday and this was not addressed.

## 2020-02-29 NOTE — Addendum Note (Signed)
Addended by: Myles Lipps on: 02/29/2020 01:24 PM   Modules accepted: Orders

## 2020-03-01 ENCOUNTER — Other Ambulatory Visit: Payer: Self-pay | Admitting: Family Medicine

## 2020-03-01 ENCOUNTER — Other Ambulatory Visit: Payer: Self-pay

## 2020-03-01 DIAGNOSIS — M2550 Pain in unspecified joint: Secondary | ICD-10-CM

## 2020-03-01 DIAGNOSIS — G43709 Chronic migraine without aura, not intractable, without status migrainosus: Secondary | ICD-10-CM

## 2020-03-01 DIAGNOSIS — G8929 Other chronic pain: Secondary | ICD-10-CM

## 2020-03-01 DIAGNOSIS — Z8639 Personal history of other endocrine, nutritional and metabolic disease: Secondary | ICD-10-CM

## 2020-03-01 MED ORDER — DULOXETINE HCL 30 MG PO CPEP: 30.0000 mg | ORAL_CAPSULE | Freq: Every day | ORAL | 3 refills | Status: DC

## 2020-03-01 MED ORDER — TOPIRAMATE 100 MG PO TABS: 100.0000 mg | ORAL_TABLET | Freq: Two times a day (BID) | ORAL | 0 refills | Status: DC

## 2020-03-01 MED ORDER — TOPIRAMATE 100 MG PO TABS: 100.0000 mg | ORAL_TABLET | Freq: Two times a day (BID) | ORAL | 3 refills | Status: DC

## 2020-03-01 MED ORDER — POTASSIUM CHLORIDE CRYS ER 20 MEQ PO TBCR: 20.0000 meq | EXTENDED_RELEASE_TABLET | Freq: Every day | ORAL | 0 refills | Status: DC

## 2020-03-01 MED ORDER — DULOXETINE HCL 30 MG PO CPEP: 30.0000 mg | ORAL_CAPSULE | Freq: Every day | ORAL | 0 refills | Status: DC

## 2020-03-01 NOTE — Telephone Encounter (Signed)
Patient is requesting a refill of the following medications: Requested Prescriptions   Pending Prescriptions Disp Refills   DULoxetine (CYMBALTA) 30 MG capsule 30 capsule 0    Sig: Take 1 capsule (30 mg total) by mouth daily.   potassium chloride SA (KLOR-CON M20) 20 MEQ tablet 30 tablet 0    Sig: Take 1 tablet (20 mEq total) by mouth at bedtime.   topiramate (TOPAMAX) 100 MG tablet 30 tablet 0    Sig: Take 1 tablet (100 mg total) by mouth 2 (two) times daily.  Potassium was filled yesterday  Date of patient request: 03/01/2020 Last office visit: 02/27/2020 Date of last refill: 09/14/2019, 01/03/2020 Last refill amount: 90, 180

## 2020-03-01 NOTE — Telephone Encounter (Signed)
Per patient request sent 30 day supply topiramate 100 mg, duloxetine 30 mg, potassium , to local pharmacy to cover while the mail order medications arrive.

## 2020-03-01 NOTE — Telephone Encounter (Signed)
Copied from CRM [id number]. Topic: Quick Communication - Rx Refill/Question >> Mar 01, 2020 10:11 AM Marylen Ponto wrote: Pt stated she is completely out of her meds and mail order will take some time. Pt requests 30 days supply be sent to local pharmacy Medication: DULoxetine (CYMBALTA) 30 MG capsule, potassium chloride SA (KLOR-CON M20) 20 MEQ tablet, and topiramate (TOPAMAX) 100 MG tablet  Has the patient contacted their pharmacy? no  Preferred Pharmacy (with phone number or street name): [id number] - [place], [place] - [address]  Phone: [phone]  Fax: [phone]  Agent: Please be advised that RX refills may take up to 3 business days. We ask that you follow-up with your pharmacy.

## 2020-03-21 LAB — SPECIMEN STATUS REPORT

## 2020-03-21 LAB — LIPASE: Lipase: 30 U/L (ref 14–72)

## 2020-03-23 ENCOUNTER — Other Ambulatory Visit: Payer: Self-pay | Admitting: Family Medicine

## 2020-03-23 DIAGNOSIS — Z8639 Personal history of other endocrine, nutritional and metabolic disease: Secondary | ICD-10-CM

## 2020-03-23 DIAGNOSIS — G43709 Chronic migraine without aura, not intractable, without status migrainosus: Secondary | ICD-10-CM

## 2020-03-23 NOTE — Telephone Encounter (Signed)
Requested Prescriptions  Pending Prescriptions Disp Refills  . topiramate (TOPAMAX) 100 MG tablet [Pharmacy Med Name: TOPIRAMATE 100 MG TABLET] 60 tablet     Sig: TAKE 1 TABLET BY MOUTH TWICE A DAY     Not Delegated - Neurology: Anticonvulsants - topiramate & zonisamide Failed - 03/23/2020  9:35 AM      Failed - This refill cannot be delegated      Failed - Cr in normal range and within 360 days    Creat  Date Value Ref Range Status  05/16/2016 0.87 0.50 - 0.99 mg/dL Final    Comment:      For patients > or = 67 years of age: The upper reference limit for Creatinine is approximately 13% higher for people identified as African-American.      Creatinine, Ser  Date Value Ref Range Status  02/27/2020 1.20 (H) 0.44 - 1.00 mg/dL Final         Passed - CO2 in normal range and within 360 days    CO2  Date Value Ref Range Status  02/27/2020 25 22 - 32 mmol/L Final         Passed - Valid encounter within last 12 months    Recent Outpatient Visits          3 weeks ago DOE (dyspnea on exertion)   [facility], Meda Coffee, MD   2 months ago Medicare annual wellness visit, subsequent   [facility], Meda Coffee, MD   2 months ago Essential hypertension   [facility], Meda Coffee, MD   3 months ago Essential hypertension   [facility], Meda Coffee, MD   4 months ago Hypotension, unspecified hypotension type   [facility], Meda Coffee, MD             . KLOR-CON M20 20 MEQ tablet [Pharmacy Med Name: KLOR-CON M20 TABLET] 90 tablet 3    Sig: TAKE 1 TABLET (20 MEQ TOTAL) BY MOUTH AT BEDTIME.     Endocrinology:  Minerals - Potassium Supplementation Failed - 03/23/2020  9:35 AM      Failed - Cr in normal range and within 360 days    Creat  Date Value Ref Range Status  05/16/2016 0.87 0.50 - 0.99 mg/dL Final    Comment:      For patients > or = 67 years of age: The upper reference limit for Creatinine is  approximately 13% higher for people identified as African-American.      Creatinine, Ser  Date Value Ref Range Status  02/27/2020 1.20 (H) 0.44 - 1.00 mg/dL Final         Passed - K in normal range and within 360 days    Potassium  Date Value Ref Range Status  02/27/2020 3.9 3.5 - 5.1 mmol/L Final         Passed - Valid encounter within last 12 months    Recent Outpatient Visits          3 weeks ago DOE (dyspnea on exertion)   [facility], Meda Coffee, MD   2 months ago Medicare annual wellness visit, subsequent   [facility], Meda Coffee, MD   2 months ago Essential hypertension   [facility], Meda Coffee, MD   3 months ago Essential hypertension   [facility], Meda Coffee, MD   4 months  ago Hypotension, unspecified hypotension type   [facility], Lilia Argue, MD

## 2020-03-23 NOTE — Telephone Encounter (Signed)
Requested medication (s) are due for refill today: yes  Requested medication (s) are on the active medication list: yes  Last refill:  03/01/20  Future visit scheduled: no  Notes to clinic:  Medication not delegated to NT to RF   Requested Prescriptions  Pending Prescriptions Disp Refills   topiramate (TOPAMAX) 100 MG tablet [Pharmacy Med Name: TOPIRAMATE 100 MG TABLET] 60 tablet     Sig: TAKE 1 TABLET BY MOUTH TWICE A DAY      Not Delegated - Neurology: Anticonvulsants - topiramate & zonisamide Failed - 03/23/2020  9:35 AM      Failed - This refill cannot be delegated      Failed - Cr in normal range and within 360 days    Creat  Date Value Ref Range Status  05/16/2016 0.87 0.50 - 0.99 mg/dL Final    Comment:      For patients > or = 67 years of age: The upper reference limit for Creatinine is approximately 13% higher for people identified as African-American.      Creatinine, Ser  Date Value Ref Range Status  02/27/2020 1.20 (H) 0.44 - 1.00 mg/dL Final          Passed - CO2 in normal range and within 360 days    CO2  Date Value Ref Range Status  02/27/2020 25 22 - 32 mmol/L Final          Passed - Valid encounter within last 12 months    Recent Outpatient Visits           3 weeks ago DOE (dyspnea on exertion)   [facility], Lilia Argue, MD   2 months ago Medicare annual wellness visit, subsequent   [facility], Lilia Argue, MD   2 months ago Essential hypertension   [facility], Lilia Argue, MD   3 months ago Essential hypertension   [facility], Lilia Argue, MD   4 months ago Hypotension, unspecified hypotension type   [facility], Lilia Argue, MD               Signed Prescriptions Disp Refills   KLOR-CON M20 20 MEQ tablet 90 tablet 3    Sig: TAKE 1 TABLET (20 MEQ TOTAL) BY MOUTH AT BEDTIME.      Endocrinology:  Minerals - Potassium Supplementation Failed - 03/23/2020  9:35  AM      Failed - Cr in normal range and within 360 days    Creat  Date Value Ref Range Status  05/16/2016 0.87 0.50 - 0.99 mg/dL Final    Comment:      For patients > or = 67 years of age: The upper reference limit for Creatinine is approximately 13% higher for people identified as African-American.      Creatinine, Ser  Date Value Ref Range Status  02/27/2020 1.20 (H) 0.44 - 1.00 mg/dL Final          Passed - K in normal range and within 360 days    Potassium  Date Value Ref Range Status  02/27/2020 3.9 3.5 - 5.1 mmol/L Final          Passed - Valid encounter within last 12 months    Recent Outpatient Visits           3 weeks ago DOE (dyspnea on exertion)   [facility], Lilia Argue, MD   2 months ago  Medicare annual wellness visit, subsequent   [facility], Meda Coffee, MD   2 months ago Essential hypertension   [facility], Meda Coffee, MD   3 months ago Essential hypertension   [facility], Meda Coffee, MD   4 months ago Hypotension, unspecified hypotension type   [facility], Meda Coffee, MD

## 2020-03-29 ENCOUNTER — Ambulatory Visit
Admission: RE | Admit: 2020-03-29 | Discharge: 2020-03-29 | Disposition: A | Discharge status: Home / Self Care | Payer: Medicare Other | Source: Ambulatory Visit | Attending: Family Medicine | Admitting: Family Medicine

## 2020-03-29 ENCOUNTER — Other Ambulatory Visit: Payer: Self-pay

## 2020-03-29 DIAGNOSIS — Z1231 Encounter for screening mammogram for malignant neoplasm of breast: Secondary | ICD-10-CM | POA: Diagnosis not present

## 2020-03-29 DIAGNOSIS — M810 Age-related osteoporosis without current pathological fracture: Secondary | ICD-10-CM | POA: Diagnosis not present

## 2020-03-29 DIAGNOSIS — Z780 Asymptomatic menopausal state: Secondary | ICD-10-CM | POA: Diagnosis not present

## 2020-03-29 DIAGNOSIS — M85851 Other specified disorders of bone density and structure, right thigh: Secondary | ICD-10-CM | POA: Diagnosis not present

## 2020-05-26 ENCOUNTER — Other Ambulatory Visit: Payer: Self-pay | Admitting: Family Medicine

## 2020-05-26 NOTE — Telephone Encounter (Signed)
Requested Prescriptions  Pending Prescriptions Disp Refills   imipramine (TOFRANIL) 50 MG tablet [Pharmacy Med Name: IMIPRAMINE HCL 50 MG TABLET] 180 tablet 1    Sig: TAKE 2 TABLETS BY MOUTH AT BEDTIME     Psychiatry:  Antidepressants - Heterocyclics (TCAs) Passed - 05/26/2020  9:11 AM      Passed - Valid encounter within last 6 months    Recent Outpatient Visits          2 months ago DOE (dyspnea on exertion)   [facility], Meda Coffee, MD   4 months ago Medicare annual wellness visit, subsequent   [facility], Meda Coffee, MD   5 months ago Essential hypertension   [facility], Meda Coffee, MD   6 months ago Essential hypertension   [facility], Meda Coffee, MD   6 months ago Hypotension, unspecified hypotension type   [facility], Meda Coffee, MD

## 2020-06-11 ENCOUNTER — Other Ambulatory Visit: Payer: Self-pay

## 2020-06-11 ENCOUNTER — Telehealth (INDEPENDENT_AMBULATORY_CARE_PROVIDER_SITE_OTHER): Payer: Medicare PPO | Admitting: Family Medicine

## 2020-06-11 DIAGNOSIS — I959 Hypotension, unspecified: Secondary | ICD-10-CM

## 2020-06-11 NOTE — Progress Notes (Signed)
Virtual Visit Note  I connected with patient on 06/11/20 at 524pm by phone due to technical difficulties and verified that I am speaking with the correct person using two identifiers. Kelli Contreras is currently located at home and patient is currently with them during visit. The provider, Myles Lipps, MD is located in their office at time of visit.  I discussed the limitations, risks, security and privacy concerns of performing an evaluation and management service by telephone and the availability of in person appointments. I also discussed with the patient that there may be a patient responsible charge related to this service. The patient expressed understanding and agreed to proceed.   I provided 10 minutes of non-face-to-face time during this encounter.  Chief Complaint  Patient presents with  . Hypertension    this past weekend pt complained of fatigue and feeling like she wanted to sleep the entire weekend, sunday 95/85, pt took out one dose HCTZ  and her BP is 120/80 apx, pt was having dizzy episodes with lightheadedness this weekend.     HPI ? Patient had been doing well until these past several days when she started becoming very tired and sleepy She started checking her BP, it was 90/60s Stopped taking HCTZ Yesterday 120/86, last night took lisinopril and metoprolol and today back in 90/60s, 80/50s Patient with h/o HTN - HTCZ 25mg , lisinopril 20mg , metoprolol XL 50mg  daily, she takes all BP meds at night She does not tend to drink lots of water, so has increased water intake, drinking gatorade  BP Readings from Last 3 Encounters:  02/28/20 (!) 141/103  02/27/20 (!) 156/96  01/22/20 128/88    Allergies  Allergen Reactions  . Other Other (See Comments)    Onions & Chocolate cause migraine    Prior to Admission medications   Medication Sig Start Date End Date Taking? Authorizing Provider  aspirin 81 MG tablet Take 81 mg by mouth at bedtime.    Yes [provider]  DULoxetine (CYMBALTA) 30 MG capsule Take 1 capsule (30 mg total) by mouth daily. 03/01/20  Yes 02/29/20, MD  hydrochlorothiazide (HYDRODIURIL) 25 MG tablet Take 1 tablet (25 mg total) by mouth daily. Patient taking differently: Take 25 mg by mouth at bedtime.  02/27/20  Yes 03/03/20, MD  imipramine (TOFRANIL) 50 MG tablet TAKE 2 TABLETS BY MOUTH AT BEDTIME 05/26/20  Yes 02/29/20, MD  KLOR-CON M20 20 MEQ tablet TAKE 1 TABLET (20 MEQ TOTAL) BY MOUTH AT BEDTIME. 03/23/20  Yes 05/28/20, MD  lisinopril (ZESTRIL) 20 MG tablet Take 1 tablet (20 mg total) by mouth daily. Patient taking differently: Take 20 mg by mouth at bedtime.  02/27/20  Yes 03/25/20, MD  metoprolol succinate (TOPROL-XL) 50 MG 24 hr tablet TAKE 1 TABLET BY MOUTH  DAILY WITH OR IMMEDIATELY  FOLLOWING A MEAL Patient taking differently: Take 50 mg by mouth at bedtime.  09/14/19  Yes 02/29/20, MD  pantoprazole (PROTONIX) 40 MG tablet Take 1 tablet (40 mg total) by mouth daily. 08/31/19  Yes 13/5/20, MD  topiramate (TOPAMAX) 100 MG tablet TAKE 1 TABLET BY MOUTH TWICE A DAY 03/25/20  Yes 09/02/19, MD  zolpidem (AMBIEN) 5 MG tablet Take 1 tablet (5 mg total) by mouth at bedtime as needed. Patient taking differently: Take 5 mg by mouth at bedtime as needed for sleep.  12/11/17  Yes 03/27/20, MD  Past Medical History:  Diagnosis Date  . Cataract    bil cataracts removed  . Depression   . Diarrhea   . Hypertension   . IBS (irritable bowel syndrome)    diarrhea  . Insomnia   . Migraine   . Osteoporosis    Phreesia 06/11/2020  . Plantar fasciitis     Past Surgical History:  Procedure Laterality Date  . ABDOMINAL HYSTERECTOMY     DUB; ovaries intact.  Marland Kitchen BREAST CYST ASPIRATION Bilateral   . BUNIONECTOMY  2007   Dr. Lestine Box  . EYE SURGERY N/A    Phreesia 06/11/2020  . VESICOVAGINAL FISTULA CLOSURE W/ TAH  1985   Dr. Nicholas Lose    Social History    Tobacco Use  . Smoking status: Never Smoker  . Smokeless tobacco: Never Used  Substance Use Topics  . Alcohol use: No    Family History  Problem Relation Age of Onset  . CAD Father 80  . Cancer Father        bladder cancer  . Diabetes Father   . Heart disease Father   . Hyperlipidemia Father   . Hypertension Father   . Valvular heart disease Mother        MVP  . Heart disease Mother   . Hyperlipidemia Mother   . Hypertension Mother   . Hypertension Brother   . Hypertension Other        mother, father, brother  . Breast cancer Maternal Aunt   . Colon cancer Neg Hx   . Esophageal cancer Neg Hx   . Rectal cancer Neg Hx   . Stomach cancer Neg Hx     Review of Systems  Constitutional: Positive for malaise/fatigue. Negative for chills, diaphoresis and fever.  Eyes: Negative for blurred vision and double vision.  Respiratory: Negative for cough and shortness of breath.   Cardiovascular: Negative for chest pain, palpitations and leg swelling.  Gastrointestinal: Negative for abdominal pain, blood in stool, constipation, diarrhea, heartburn, melena, nausea and vomiting.  Genitourinary: Negative for hematuria.  Neurological: Negative for dizziness, focal weakness and headaches.  Endo/Heme/Allergies: Does not bruise/bleed easily.    Objective  Vitals as reported by the patient: per above  Gen: AAOx3, NAD Patient is speaking in full sentences, no resp distress   ASSESSMENT and PLAN  1. Hypotension, unspecified hypotension type Hold all BP meds tonight. Come tomorrow for nurse visit, BP check, bring bp cuff to check and labs. Follow up in clinic in 3 days. ER precautions given.  - CBC; Future - Comprehensive metabolic panel; Future - TSH; Future  FOLLOW-UP: per above   The above assessment and management plan was discussed with the patient. The patient verbalized understanding of and has agreed to the management plan. Patient is aware to call the clinic if symptoms  persist or worsen. Patient is aware when to return to the clinic for a follow-up visit. Patient educated on when it is appropriate to go to the emergency department.     Myles Lipps, MD Primary Care at [facility] [address] [place], [place] [address] Ph.  [phone] Fax [phone]

## 2020-06-11 NOTE — Patient Instructions (Signed)
° ° ° °  If you have lab work done today you will be contacted with your lab results within the next 2 weeks.  If you have not heard from us then please contact us. The fastest way to get your results is to register for My Chart. ° ° °IF you received an x-ray today, you will receive an invoice from [facility]. Please contact [facility] at [phone] with questions or concerns regarding your invoice.  ° °IF you received labwork today, you will receive an invoice from [facility]. Please contact [facility] at [phone] with questions or concerns regarding your invoice.  ° °Our billing staff will not be able to assist you with questions regarding bills from these companies. ° °You will be contacted with the lab results as soon as they are available. The fastest way to get your results is to activate your My Chart account. Instructions are located on the last page of this paperwork. If you have not heard from us regarding the results in 2 weeks, please contact this office. °  ° ° ° °

## 2020-06-12 ENCOUNTER — Ambulatory Visit (INDEPENDENT_AMBULATORY_CARE_PROVIDER_SITE_OTHER): Payer: Medicare PPO | Admitting: Emergency Medicine

## 2020-06-12 DIAGNOSIS — I959 Hypotension, unspecified: Secondary | ICD-10-CM | POA: Diagnosis not present

## 2020-06-13 LAB — COMPREHENSIVE METABOLIC PANEL
ALT: 9 IU/L (ref 0–32)
AST: 16 IU/L (ref 0–40)
Albumin/Globulin Ratio: 1.4 (ref 1.2–2.2)
Albumin: 3.8 g/dL (ref 3.8–4.8)
Alkaline Phosphatase: 109 IU/L (ref 48–121)
BUN/Creatinine Ratio: 15 (ref 12–28)
BUN: 15 mg/dL (ref 8–27)
Bilirubin Total: 0.4 mg/dL (ref 0.0–1.2)
CO2: 23 mmol/L (ref 20–29)
Calcium: 9.2 mg/dL (ref 8.7–10.3)
Chloride: 106 mmol/L (ref 96–106)
Creatinine, Ser: 1.01 mg/dL — ABNORMAL HIGH (ref 0.57–1.00)
GFR calc Af Amer: 67 mL/min/{1.73_m2} (ref >59)
GFR calc non Af Amer: 58 mL/min/{1.73_m2} — ABNORMAL LOW (ref >59)
Globulin, Total: 2.8 g/dL (ref 1.5–4.5)
Glucose: 84 mg/dL (ref 65–99)
Potassium: 4 mmol/L (ref 3.5–5.2)
Sodium: 140 mmol/L (ref 134–144)
Total Protein: 6.6 g/dL (ref 6.0–8.5)

## 2020-06-13 LAB — CBC
Hematocrit: 43 % (ref 34.0–46.6)
Hemoglobin: 14.2 g/dL (ref 11.1–15.9)
MCH: 29.2 pg (ref 26.6–33.0)
MCHC: 33 g/dL (ref 31.5–35.7)
MCV: 88 fL (ref 79–97)
Platelets: 162 x10E3/uL (ref 150–450)
RBC: 4.87 x10E6/uL (ref 3.77–5.28)
RDW: 13 % (ref 11.7–15.4)
WBC: 5.1 x10E3/uL (ref 3.4–10.8)

## 2020-06-13 LAB — TSH: TSH: 1.55 u[IU]/mL (ref 0.450–4.500)

## 2020-06-13 NOTE — Progress Notes (Signed)
Pt. Has been scheduled and confirmed appt. For 06/14/2020

## 2020-06-14 ENCOUNTER — Other Ambulatory Visit: Payer: Self-pay

## 2020-06-14 ENCOUNTER — Ambulatory Visit: Payer: Medicare PPO | Admitting: Family Medicine

## 2020-06-14 ENCOUNTER — Encounter: Payer: Self-pay | Admitting: Family Medicine

## 2020-06-14 VITALS — BP 119/87 | HR 108 | Temp 98.0°F | Ht 62.0 in | Wt 174.6 lb

## 2020-06-14 DIAGNOSIS — G43709 Chronic migraine without aura, not intractable, without status migrainosus: Secondary | ICD-10-CM

## 2020-06-14 DIAGNOSIS — I10 Essential (primary) hypertension: Secondary | ICD-10-CM | POA: Diagnosis not present

## 2020-06-14 DIAGNOSIS — F5104 Psychophysiologic insomnia: Secondary | ICD-10-CM | POA: Diagnosis not present

## 2020-06-14 DIAGNOSIS — Z8639 Personal history of other endocrine, nutritional and metabolic disease: Secondary | ICD-10-CM | POA: Diagnosis not present

## 2020-06-14 MED ORDER — METOPROLOL SUCCINATE ER 25 MG PO TB24: 25.0000 mg | ORAL_TABLET | Freq: Every day | ORAL | 2 refills | Status: DC

## 2020-06-14 NOTE — Patient Instructions (Signed)
° ° ° °  If you have lab work done today you will be contacted with your lab results within the next 2 weeks.  If you have not heard from us then please contact us. The fastest way to get your results is to register for My Chart. ° ° °IF you received an x-ray today, you will receive an invoice from [facility]. Please contact [facility] at [phone] with questions or concerns regarding your invoice.  ° °IF you received labwork today, you will receive an invoice from [facility]. Please contact [facility] at [phone] with questions or concerns regarding your invoice.  ° °Our billing staff will not be able to assist you with questions regarding bills from these companies. ° °You will be contacted with the lab results as soon as they are available. The fastest way to get your results is to activate your My Chart account. Instructions are located on the last page of this paperwork. If you have not heard from us regarding the results in 2 weeks, please contact this office. °  ° ° ° °

## 2020-06-14 NOTE — Progress Notes (Signed)
8/6/202111:49 AM  Kelli Contreras 03-14-1951, 69 y.o., female [id number]  Chief Complaint  Patient presents with  . Blood Pressure Check    patient bp was running 88/57 was told to go off all my med Tues. Since been off med bp lastg night was 157/101 HR 109. Have develpoed a tingling in finger and a slight headache since been off med  . Medication Refill    if keep me on the same med I need a refill on Imipramine, lisinopril,metoprolol    HPI:   Patient is a 69 y.o. female with past medical history significant for HTN, hypokalemia, insomnia, IBS with diarrhea, arthritis, depression, anxiety and migraineswho presents today forfollowup on BP  Last ov 3 days ago, bp 88/50s, held meds Since she stopped BP meds she has continued to check BP Last night it was 157/102 She denies high salt/caffeine intake. Denies regular nsaids/decongestant use. Does not drink etoh Bp cuff brought in and accurate  She stopped taking topomax and imipramine as was not sure  Starting to have headaches and not sleeping well  Depression screen [facility] 2/9 06/14/2020 06/11/2020 02/27/2020  Decreased Interest 0 0 0  Down, Depressed, Hopeless 0 0 0  PHQ - 2 Score 0 0 0    Fall Risk  06/14/2020 06/11/2020 02/27/2020 01/22/2020 12/26/2019  Falls in the past year? 1 0 0 0 0  Number falls in past yr: 0 0 0 0 0  Injury with Fall? 0 0 0 0 0  Risk for fall due to : Impaired mobility No Fall Risks - - -  Risk for fall due to: Comment got light headed. Did not fall on the ground but fell on recliner - - - -  Follow up Falls evaluation completed Falls evaluation completed - Falls evaluation completed;Education provided -     Allergies  Allergen Reactions  . Other Other (See Comments)    Onions & Chocolate cause migraine    Prior to Admission medications   Medication Sig Start Date End Date Taking? Authorizing Provider  aspirin 81 MG tablet Take 81 mg by mouth at bedtime.    Yes [provider]  DULoxetine  (CYMBALTA) 30 MG capsule Take 1 capsule (30 mg total) by mouth daily. 03/01/20  Yes Myles Lipps, MD  hydrochlorothiazide (HYDRODIURIL) 25 MG tablet Take 1 tablet (25 mg total) by mouth daily. Patient taking differently: Take 25 mg by mouth at bedtime.  02/27/20  Yes Myles Lipps, MD  imipramine (TOFRANIL) 50 MG tablet TAKE 2 TABLETS BY MOUTH AT BEDTIME 05/26/20  Yes Myles Lipps, MD  KLOR-CON M20 20 MEQ tablet TAKE 1 TABLET (20 MEQ TOTAL) BY MOUTH AT BEDTIME. 03/23/20  Yes Myles Lipps, MD  lisinopril (ZESTRIL) 20 MG tablet Take 1 tablet (20 mg total) by mouth daily. Patient taking differently: Take 20 mg by mouth at bedtime.  02/27/20  Yes Myles Lipps, MD  metoprolol succinate (TOPROL-XL) 50 MG 24 hr tablet TAKE 1 TABLET BY MOUTH  DAILY WITH OR IMMEDIATELY  FOLLOWING A MEAL Patient taking differently: Take 50 mg by mouth at bedtime.  09/14/19  Yes Myles Lipps, MD  pantoprazole (PROTONIX) 40 MG tablet Take 1 tablet (40 mg total) by mouth daily. 08/31/19  Yes Hilarie Fredrickson, MD  topiramate (TOPAMAX) 100 MG tablet TAKE 1 TABLET BY MOUTH TWICE A DAY 03/25/20  Yes Myles Lipps, MD  zolpidem (AMBIEN) 5 MG tablet Take 1 tablet (5 mg total) by mouth  at bedtime as needed. Patient taking differently: Take 5 mg by mouth at bedtime as needed for sleep.  12/11/17  Yes Ethelda Chick, MD    Past Medical History:  Diagnosis Date  . Cataract    bil cataracts removed  . Depression   . Diarrhea   . Hypertension   . IBS (irritable bowel syndrome)    diarrhea  . Insomnia   . Migraine   . Osteoporosis    Phreesia 06/11/2020  . Plantar fasciitis     Past Surgical History:  Procedure Laterality Date  . ABDOMINAL HYSTERECTOMY     DUB; ovaries intact.  Marland Kitchen BREAST CYST ASPIRATION Bilateral   . BUNIONECTOMY  2007   Dr. Lestine Box  . EYE SURGERY N/A    Phreesia 06/11/2020  . VESICOVAGINAL FISTULA CLOSURE W/ TAH  1985   Dr. Nicholas Lose    Social History   Tobacco Use  . Smoking status:  Never Smoker  . Smokeless tobacco: Never Used  Substance Use Topics  . Alcohol use: No    Family History  Problem Relation Age of Onset  . CAD Father 1  . Cancer Father        bladder cancer  . Diabetes Father   . Heart disease Father   . Hyperlipidemia Father   . Hypertension Father   . Valvular heart disease Mother        MVP  . Heart disease Mother   . Hyperlipidemia Mother   . Hypertension Mother   . Hypertension Brother   . Hypertension Other        mother, father, brother  . Breast cancer Maternal Aunt   . Colon cancer Neg Hx   . Esophageal cancer Neg Hx   . Rectal cancer Neg Hx   . Stomach cancer Neg Hx     Review of Systems  Constitutional: Negative for chills and fever.  Respiratory: Negative for cough and shortness of breath.   Cardiovascular: Negative for chest pain, palpitations and leg swelling.  Gastrointestinal: Negative for abdominal pain, nausea and vomiting.     OBJECTIVE:  Today's Vitals   06/14/20 1136  BP: 119/87  Pulse: (!) 108  Temp: 98 F (36.7 C)  TempSrc: Temporal  SpO2: 98%  Weight: 174 lb 9.6 oz (79.2 kg)  Height: 5\' 2"  (1.575 m)   Body mass index is 31.93 kg/m.   Physical Exam Vitals and nursing note reviewed.  Constitutional:      Appearance: She is well-developed.  HENT:     Head: Normocephalic and atraumatic.     Mouth/Throat:     Pharynx: No oropharyngeal exudate.  Eyes:     General: No scleral icterus.    Extraocular Movements: Extraocular movements intact.     Conjunctiva/sclera: Conjunctivae normal.     Pupils: Pupils are equal, round, and reactive to light.  Cardiovascular:     Rate and Rhythm: Regular rhythm. Tachycardia present.     Heart sounds: Normal heart sounds. No murmur heard.  No friction rub. No gallop.   Pulmonary:     Effort: Pulmonary effort is normal.     Breath sounds: Normal breath sounds. No wheezing, rhonchi or rales.  Musculoskeletal:     Cervical back: Neck supple.  Skin:     General: Skin is warm and dry.  Neurological:     Mental Status: She is alert and oriented to person, place, and time.     No results found for this or any previous visit (from the  past 24 hour(s)).  No results found.   ASSESSMENT and PLAN  1. Essential hypertension Normal in office off meds, restarting low dose metoprolol given tachycardia and higher readings at home. D/c hctz and lisinopril. Cont BP monitoring  2. Chronic migraine without aura without status migrainosus, not intractable Restart topomax  3. Psychophysiological insomnia Restart imipramine  4. H/O hypokalemia Normal K. Since d/c hctz, trial off KCl, recheck in 2 weeks - Basic Metabolic Panel; Future  Other orders - metoprolol succinate (TOPROL-XL) 25 MG 24 hr tablet; Take 1 tablet (25 mg total) by mouth at bedtime.  Return for virtual visit in 1 week, lab visit in 2 weeks.    Myles Lipps, MD Primary Care at [facility] [address] [place], [place] [address] Ph.  [phone] Fax [phone]

## 2020-06-21 ENCOUNTER — Other Ambulatory Visit: Payer: Self-pay

## 2020-06-21 ENCOUNTER — Encounter: Payer: Self-pay | Admitting: Family Medicine

## 2020-06-21 ENCOUNTER — Telehealth (INDEPENDENT_AMBULATORY_CARE_PROVIDER_SITE_OTHER): Payer: Medicare PPO | Admitting: Family Medicine

## 2020-06-21 VITALS — BP 151/92 | HR 70 | Ht 62.0 in

## 2020-06-21 DIAGNOSIS — I10 Essential (primary) hypertension: Secondary | ICD-10-CM | POA: Diagnosis not present

## 2020-06-21 MED ORDER — LISINOPRIL 20 MG PO TABS: 20.0000 mg | ORAL_TABLET | Freq: Every day | ORAL | 1 refills | Status: DC

## 2020-06-21 NOTE — Patient Instructions (Signed)
° ° ° °  If you have lab work done today you will be contacted with your lab results within the next 2 weeks.  If you have not heard from us then please contact us. The fastest way to get your results is to register for My Chart. ° ° °IF you received an x-ray today, you will receive an invoice from [facility]. Please contact [facility] at [phone] with questions or concerns regarding your invoice.  ° °IF you received labwork today, you will receive an invoice from [facility]. Please contact [facility] at [phone] with questions or concerns regarding your invoice.  ° °Our billing staff will not be able to assist you with questions regarding bills from these companies. ° °You will be contacted with the lab results as soon as they are available. The fastest way to get your results is to activate your My Chart account. Instructions are located on the last page of this paperwork. If you have not heard from us regarding the results in 2 weeks, please contact this office. °  ° ° ° °

## 2020-06-21 NOTE — Progress Notes (Signed)
Virtual Visit Note  I connected with patient on 06/21/20 at 257pm by video doximity and verified that I am speaking with the correct person using two identifiers. Kelli Contreras is currently located at [facility] patient is currently with them during visit. The provider, Myles Lipps, MD is located in their office at time of visit.  I discussed the limitations, risks, security and privacy concerns of performing an evaluation and management service by telephone and the availability of in person appointments. I also discussed with the patient that there may be a patient responsible charge related to this service. The patient expressed understanding and agreed to proceed.   I provided 10 minutes of non-face-to-face time during this encounter.  Chief Complaint  Patient presents with  . Hypertension    1 week f.u     HPI ? Last OV a week ago - held lisinopril and HCTZ due to hypotension (80/50s), cont home BP monitoring  She is overall doing well Has no acute concerns She has been checking BP at home She has been taking metoprolol XL 25mg  at bedtime Used to take metoprolol 50mg , lisinopril 20mg  and HCTZ 25mg  124/86, 184/97 128/90, 187/95 157/92 151/91 - today Pulse has been normal, lowest 70 today She will be going out of town next week - might need to reschedule She will come in for labs next week  BP Readings from Last 3 Encounters:  06/21/20 (!) 151/92  06/14/20 119/87  06/12/20 123/89    Allergies  Allergen Reactions  . Other Other (See Comments)    Onions & Chocolate cause migraine    Prior to Admission medications   Medication Sig Start Date End Date Taking? Authorizing Provider  aspirin 81 MG tablet Take 81 mg by mouth at bedtime.    Yes [provider]  DULoxetine (CYMBALTA) 30 MG capsule Take 1 capsule (30 mg total) by mouth daily. 03/01/20  Yes 06/23/20, MD  imipramine (TOFRANIL) 50 MG tablet TAKE 2 TABLETS BY MOUTH AT BEDTIME 05/26/20  Yes  08/12/20, MD  metoprolol succinate (TOPROL-XL) 25 MG 24 hr tablet Take 1 tablet (25 mg total) by mouth at bedtime. 06/14/20  Yes Myles Lipps, MD  pantoprazole (PROTONIX) 40 MG tablet Take 1 tablet (40 mg total) by mouth daily. 08/31/19  Yes Myles Lipps, MD  topiramate (TOPAMAX) 100 MG tablet TAKE 1 TABLET BY MOUTH TWICE A DAY 03/25/20  Yes Myles Lipps, MD  zolpidem (AMBIEN) 5 MG tablet Take 1 tablet (5 mg total) by mouth at bedtime as needed. Patient taking differently: Take 5 mg by mouth at bedtime as needed for sleep.  12/11/17  Yes Hilarie Fredrickson, MD    Past Medical History:  Diagnosis Date  . Cataract    bil cataracts removed  . Depression   . Diarrhea   . Hypertension   . IBS (irritable bowel syndrome)    diarrhea  . Insomnia   . Migraine   . Osteoporosis    Phreesia 06/11/2020  . Plantar fasciitis     Past Surgical History:  Procedure Laterality Date  . ABDOMINAL HYSTERECTOMY     DUB; ovaries intact.  Myles Lipps BREAST CYST ASPIRATION Bilateral   . BUNIONECTOMY  2007   Dr. Ethelda Chick  . EYE SURGERY N/A    Phreesia 06/11/2020  . VESICOVAGINAL FISTULA CLOSURE W/ TAH  1985   Dr. Marland Kitchen    Social History   Tobacco Use  . Smoking status: Never Smoker  .  Smokeless tobacco: Never Used  Substance Use Topics  . Alcohol use: No    Family History  Problem Relation Age of Onset  . CAD Father 40  . Cancer Father        bladder cancer  . Diabetes Father   . Heart disease Father   . Hyperlipidemia Father   . Hypertension Father   . Valvular heart disease Mother        MVP  . Heart disease Mother   . Hyperlipidemia Mother   . Hypertension Mother   . Hypertension Brother   . Hypertension Other        mother, father, brother  . Breast cancer Maternal Aunt   . Colon cancer Neg Hx   . Esophageal cancer Neg Hx   . Rectal cancer Neg Hx   . Stomach cancer Neg Hx     ROS Per hpi  Objective  Vitals as reported by the patient: per above  GEN: AAOx3,  NAD HEENT: [place]/AT, pupils are symmetrical, EOMI, non-icteric sclera Resp: breathing comfortably, speaking in full sentences Skin: no rashes noted, no pallor Psych: good eye contact, normal mood and affect   ASSESSMENT and PLAN  1. Essential hypertension Elevated, restart lisinopril. Avoid HCTZ due to recurring hypotension. Labs next week  Other orders - lisinopril (ZESTRIL) 20 MG tablet; Take 1 tablet (20 mg total) by mouth daily.  FOLLOW-UP: as scheduled    The above assessment and management plan was discussed with the patient. The patient verbalized understanding of and has agreed to the management plan. Patient is aware to call the clinic if symptoms persist or worsen. Patient is aware when to return to the clinic for a follow-up visit. Patient educated on when it is appropriate to go to the emergency department.     Myles Lipps, MD Primary Care at [facility] [address] [place], [place] [address] Ph.  [phone] Fax [phone]

## 2020-06-28 ENCOUNTER — Ambulatory Visit: Payer: Medicare PPO

## 2020-07-01 ENCOUNTER — Other Ambulatory Visit: Payer: Self-pay

## 2020-07-01 ENCOUNTER — Ambulatory Visit (INDEPENDENT_AMBULATORY_CARE_PROVIDER_SITE_OTHER): Payer: Medicare PPO | Admitting: Family Medicine

## 2020-07-01 DIAGNOSIS — Z8639 Personal history of other endocrine, nutritional and metabolic disease: Secondary | ICD-10-CM

## 2020-07-02 LAB — BASIC METABOLIC PANEL WITH GFR
BUN/Creatinine Ratio: 14 (ref 12–28)
BUN: 14 mg/dL (ref 8–27)
CO2: 21 mmol/L (ref 20–29)
Calcium: 8.6 mg/dL — ABNORMAL LOW (ref 8.7–10.3)
Chloride: 108 mmol/L — ABNORMAL HIGH (ref 96–106)
Creatinine, Ser: 0.99 mg/dL (ref 0.57–1.00)
GFR calc Af Amer: 69 mL/min/{1.73_m2}
GFR calc non Af Amer: 60 mL/min/{1.73_m2}
Glucose: 85 mg/dL (ref 65–99)
Potassium: 3.8 mmol/L (ref 3.5–5.2)
Sodium: 143 mmol/L (ref 134–144)

## 2020-07-14 ENCOUNTER — Other Ambulatory Visit: Payer: Self-pay | Admitting: Family Medicine

## 2020-07-14 NOTE — Telephone Encounter (Signed)
Requested Prescriptions  Pending Prescriptions Disp Refills  . lisinopril (ZESTRIL) 20 MG tablet [Pharmacy Med Name: LISINOPRIL 20 MG TABLET] 90 tablet 1    Sig: TAKE 1 TABLET BY MOUTH EVERY DAY     Cardiovascular:  ACE Inhibitors Failed - 07/14/2020  9:30 AM      Failed - Last BP in normal range    BP Readings from Last 1 Encounters:  06/21/20 (!) 151/92         Passed - Cr in normal range and within 180 days    Creat  Date Value Ref Range Status  05/16/2016 0.87 0.50 - 0.99 mg/dL Final    Comment:      For patients > or = 67 years of age: The upper reference limit for Creatinine is approximately 13% higher for people identified as African-American.      Creatinine, Ser  Date Value Ref Range Status  07/01/2020 0.99 0.57 - 1.00 mg/dL Final         Passed - K in normal range and within 180 days    Potassium  Date Value Ref Range Status  07/01/2020 3.8 3.5 - 5.2 mmol/L Final         Passed - Patient is not pregnant      Passed - Valid encounter within last 6 months    Recent Outpatient Visits          1 week ago H/O hypokalemia   [facility], Meda Coffee, MD   3 weeks ago Essential hypertension   [facility], Meda Coffee, MD   1 month ago Essential hypertension   [facility], Meda Coffee, MD   1 month ago Hypotension, unspecified hypotension type   Primary Care at [facility], [place], MD   1 month ago Hypotension, unspecified hypotension type   [facility] at [facility], Meda Coffee, MD

## 2020-08-13 ENCOUNTER — Ambulatory Visit (INDEPENDENT_AMBULATORY_CARE_PROVIDER_SITE_OTHER): Payer: Medicare PPO | Admitting: Family Medicine

## 2020-08-13 ENCOUNTER — Encounter: Payer: Self-pay | Admitting: Family Medicine

## 2020-08-13 ENCOUNTER — Other Ambulatory Visit: Payer: Self-pay

## 2020-08-13 VITALS — BP 142/89 | HR 80 | Temp 98.3°F | Resp 15 | Ht 62.0 in | Wt 173.2 lb

## 2020-08-13 DIAGNOSIS — G43709 Chronic migraine without aura, not intractable, without status migrainosus: Secondary | ICD-10-CM

## 2020-08-13 DIAGNOSIS — I10 Essential (primary) hypertension: Secondary | ICD-10-CM | POA: Diagnosis not present

## 2020-08-13 MED ORDER — TOPIRAMATE 100 MG PO TABS: 100.0000 mg | ORAL_TABLET | Freq: Two times a day (BID) | ORAL | 1 refills | Status: DC

## 2020-08-13 MED ORDER — DULOXETINE HCL 30 MG PO CPEP: 30.0000 mg | ORAL_CAPSULE | Freq: Every day | ORAL | 1 refills | Status: DC

## 2020-08-13 MED ORDER — METOPROLOL SUCCINATE ER 25 MG PO TB24
25.0000 mg | ORAL_TABLET | Freq: Every day | ORAL | 1 refills | Status: DC
Start: 2020-08-13 — End: 2021-02-19

## 2020-08-13 MED ORDER — LISINOPRIL 40 MG PO TABS: 40.0000 mg | ORAL_TABLET | Freq: Every day | ORAL | 1 refills | Status: DC

## 2020-08-13 MED ORDER — SUMATRIPTAN SUCCINATE 100 MG PO TABS: ORAL_TABLET | ORAL | 4 refills | Status: AC

## 2020-08-13 NOTE — Progress Notes (Signed)
10/5/202110:45 AM  Kelli Contreras 10-28-1951, 69 y.o., female [id number]  Chief Complaint  Patient presents with  . Hypertension    pt doing well today needs refills on some meds, pt reports okay BP at home about 140/90 some days higher some are within range. no side effects or physical symptoms     HPI:   Patient is a 69 y.o. female with past medical history significant for HTN, hypokalemia, insomnia, IBS with diarrhea, arthritis, depression, anxiety and migraineswho presents today for followup on BP  Last OV aug 2021 - restarted lisinopril, avoiding HCTZ given recurrent hypotension  She is overall doing well She denies any more hypotension She has been checking BP at home ocassional, similar to today Topomax helps control her migraines, she has had a headache for past 2 weeks but that is more related to recent neck pain She has been using excedrin migraine for abortive therapy She used to be on relpax and imitrex maxalt did not work She needs to schedule appt with GI Dr Marina Goodell She continues to have mild DOE with long distance since covid  BP Readings from Last 3 Encounters:  08/13/20 (!) 142/89  06/21/20 (!) 151/92  06/14/20 119/87   Lab Results  Component Value Date   TSH 1.550 06/12/2020    Lab Results  Component Value Date   CREATININE 0.99 07/01/2020   BUN 14 07/01/2020   NA 143 07/01/2020   K 3.8 07/01/2020   CL 108 (H) 07/01/2020   CO2 21 07/01/2020    Depression screen [facility] 2/9 08/13/2020 06/21/2020 06/14/2020  Decreased Interest 0 0 0  Down, Depressed, Hopeless 0 0 0  PHQ - 2 Score 0 0 0    Fall Risk  08/13/2020 06/21/2020 06/14/2020 06/11/2020 02/27/2020  Falls in the past year? 0 0 1 0 0  Number falls in past yr: 0 0 0 0 0  Injury with Fall? 0 0 0 0 0  Risk for fall due to : No Fall Risks - Impaired mobility No Fall Risks -  Risk for fall due to: Comment - - got light headed. Did not fall on the ground but fell on recliner - -  Follow up Falls  evaluation completed Falls evaluation completed Falls evaluation completed Falls evaluation completed -     Allergies  Allergen Reactions  . Other Other (See Comments)    Onions & Chocolate cause migraine    Prior to Admission medications   Medication Sig Start Date End Date Taking? Authorizing Provider  aspirin 81 MG tablet Take 81 mg by mouth at bedtime.     [provider]  DULoxetine (CYMBALTA) 30 MG capsule Take 1 capsule (30 mg total) by mouth daily. 03/01/20   Myles Lipps, MD  imipramine (TOFRANIL) 50 MG tablet TAKE 2 TABLETS BY MOUTH AT BEDTIME 05/26/20   Myles Lipps, MD  lisinopril (ZESTRIL) 20 MG tablet TAKE 1 TABLET BY MOUTH EVERY DAY 07/14/20   Myles Lipps, MD  metoprolol succinate (TOPROL-XL) 25 MG 24 hr tablet Take 1 tablet (25 mg total) by mouth at bedtime. 06/14/20   Myles Lipps, MD  pantoprazole (PROTONIX) 40 MG tablet Take 1 tablet (40 mg total) by mouth daily. 08/31/19   Hilarie Fredrickson, MD  topiramate (TOPAMAX) 100 MG tablet TAKE 1 TABLET BY MOUTH TWICE A DAY 03/25/20   Myles Lipps, MD  zolpidem (AMBIEN) 5 MG tablet Take 1 tablet (5 mg total) by mouth at bedtime as needed.  Patient taking differently: Take 5 mg by mouth at bedtime as needed for sleep.  12/11/17   Ethelda Chick, MD    Past Medical History:  Diagnosis Date  . Cataract    bil cataracts removed  . Depression   . Diarrhea   . Hypertension   . IBS (irritable bowel syndrome)    diarrhea  . Insomnia   . Migraine   . Osteoporosis    Phreesia 06/11/2020  . Plantar fasciitis     Past Surgical History:  Procedure Laterality Date  . ABDOMINAL HYSTERECTOMY     DUB; ovaries intact.  [facility] BREAST CYST ASPIRATION Bilateral   . BUNIONECTOMY  2007   Dr. Lestine Box  . EYE SURGERY N/A    [employer]reesia 06/11/2020  . VESICOVAGINAL FISTULA CLOSURE W/ TAH  1985   Dr. Nicholas Lose    Social History   Tobacco Use  . Smoking status: Never Smoker  . Smokeless tobacco: Never Used  Substance Use  Topics  . Alcohol use: No    Family History  Problem Relation Age of Onset  . CAD Father 42  . Cancer Father        bladder cancer  . Diabetes Father   . Heart disease Father   . Hyperlipidemia Father   . Hypertension Father   . Valvular heart disease Mother        MVP  . Heart disease Mother   . Hyperlipidemia Mother   . Hypertension Mother   . Hypertension Brother   . Hypertension Other        mother, father, brother  . Breast cancer Maternal Aunt   . Colon cancer Neg Hx   . Esophageal cancer Neg Hx   . Rectal cancer Neg Hx   . Stomach cancer Neg Hx     Review of Systems  Constitutional: Negative for chills and fever.  Respiratory: Positive for shortness of breath. Negative for cough.   Cardiovascular: Negative for chest pain, palpitations and leg swelling.  Gastrointestinal: Negative for abdominal pain, nausea and vomiting.  per hpi   OBJECTIVE:  Today's Vitals   08/13/20 1034  BP: (!) 142/89  Pulse: 80  Resp: 15  Temp: 98.3 F (36.8 C)  TempSrc: Temporal  SpO2: 96%  Weight: 173 lb 3.2 oz (78.6 kg)  Height: 5\' 2"  (1.575 m)   Body mass index is 31.68 kg/m.   Physical Exam Vitals and nursing note reviewed.  Constitutional:      Appearance: She is well-developed.  HENT:     Head: Normocephalic and atraumatic.     Mouth/Throat:     Pharynx: No oropharyngeal exudate.  Eyes:     General: No scleral icterus.    Extraocular Movements: Extraocular movements intact.     Conjunctiva/sclera: Conjunctivae normal.     Pupils: Pupils are equal, round, and reactive to light.  Cardiovascular:     Rate and Rhythm: Normal rate and regular rhythm.     Heart sounds: Normal heart sounds. No murmur heard.  No friction rub. No gallop.   Pulmonary:     Effort: Pulmonary effort is normal.     Breath sounds: Normal breath sounds. No wheezing, rhonchi or rales.  Musculoskeletal:     Cervical back: Neck supple.  Skin:    General: Skin is warm and dry.   Neurological:     Mental Status: She is alert and oriented to person, place, and time.     No results found for this or any previous visit (  from the past 24 hour(s)).  No results found.   ASSESSMENT and PLAN  1. Chronic migraine without aura without status migrainosus, not intractable Overall stable. Adding imitrex for abortive therapy, reviewed r/se/b - topiramate (TOPAMAX) 100 MG tablet; Take 1 tablet (100 mg total) by mouth 2 (two) times daily.  2. Essential hypertension Not at goal. Increasing lisinopril to 40mg  daily - Basic Metabolic Panel  Other orders - DULoxetine (CYMBALTA) 30 MG capsule; Take 1 capsule (30 mg total) by mouth daily. - lisinopril (ZESTRIL) 40 MG tablet; Take 1 tablet (40 mg total) by mouth daily. - metoprolol succinate (TOPROL-XL) 25 MG 24 hr tablet; Take 1 tablet (25 mg total) by mouth at bedtime. - SUMAtriptan (IMITREX) 100 MG tablet; Take 1/2 tab (50mg ) at onset of migraine. May take other half tab in 2 hours if headache persists or recurs.  Return in about 3 months (around 11/13/2020).    , MD Primary Care at [facility] [address] [place], [address] [place] Ph.  [phone] Fax [phone]

## 2020-08-13 NOTE — Patient Instructions (Signed)
° ° ° °  If you have lab work done today you will be contacted with your lab results within the next 2 weeks.  If you have not heard from us then please contact us. The fastest way to get your results is to register for My Chart. ° ° °IF you received an x-ray today, you will receive an invoice from [facility]. Please contact [facility] at [phone] with questions or concerns regarding your invoice.  ° °IF you received labwork today, you will receive an invoice from [facility]. Please contact [facility] at [phone] with questions or concerns regarding your invoice.  ° °Our billing staff will not be able to assist you with questions regarding bills from these companies. ° °You will be contacted with the lab results as soon as they are available. The fastest way to get your results is to activate your My Chart account. Instructions are located on the last page of this paperwork. If you have not heard from us regarding the results in 2 weeks, please contact this office. °  ° ° ° °

## 2020-08-14 LAB — BASIC METABOLIC PANEL
BUN/Creatinine Ratio: 14 (ref 12–28)
BUN: 15 mg/dL (ref 8–27)
CO2: 23 mmol/L (ref 20–29)
Calcium: 9.2 mg/dL (ref 8.7–10.3)
Chloride: 107 mmol/L — ABNORMAL HIGH (ref 96–106)
Creatinine, Ser: 1.04 mg/dL — ABNORMAL HIGH (ref 0.57–1.00)
GFR calc Af Amer: 65 mL/min/{1.73_m2} (ref >59)
GFR calc non Af Amer: 56 mL/min/{1.73_m2} — ABNORMAL LOW (ref >59)
Glucose: 83 mg/dL (ref 65–99)
Potassium: 3.9 mmol/L (ref 3.5–5.2)
Sodium: 139 mmol/L (ref 134–144)

## 2020-08-21 ENCOUNTER — Other Ambulatory Visit: Payer: Self-pay | Admitting: Internal Medicine

## 2020-09-15 IMAGING — MG DIGITAL SCREENING BILAT W/ TOMO W/ CAD
8 series · 8 of 24 positions shown · non-contrast
Comparison: Previous exam(s).

CLINICAL DATA: Screening.

EXAM:
DIGITAL SCREENING BILATERAL MAMMOGRAM WITH TOMO AND CAD

[R MLO synth-2D]
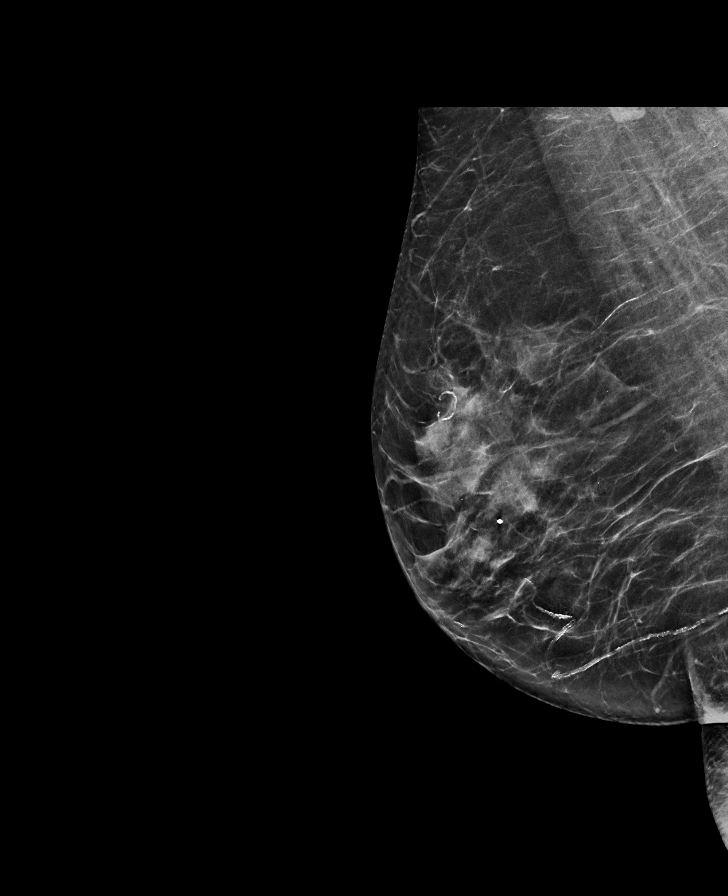

[R CC synth-2D]
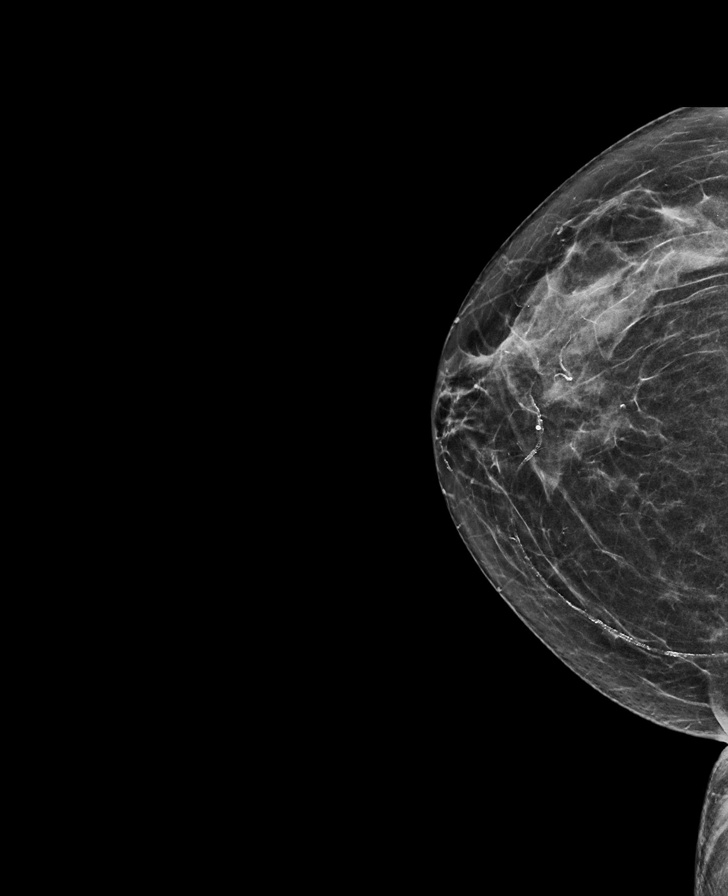

[L MLO synth-2D]
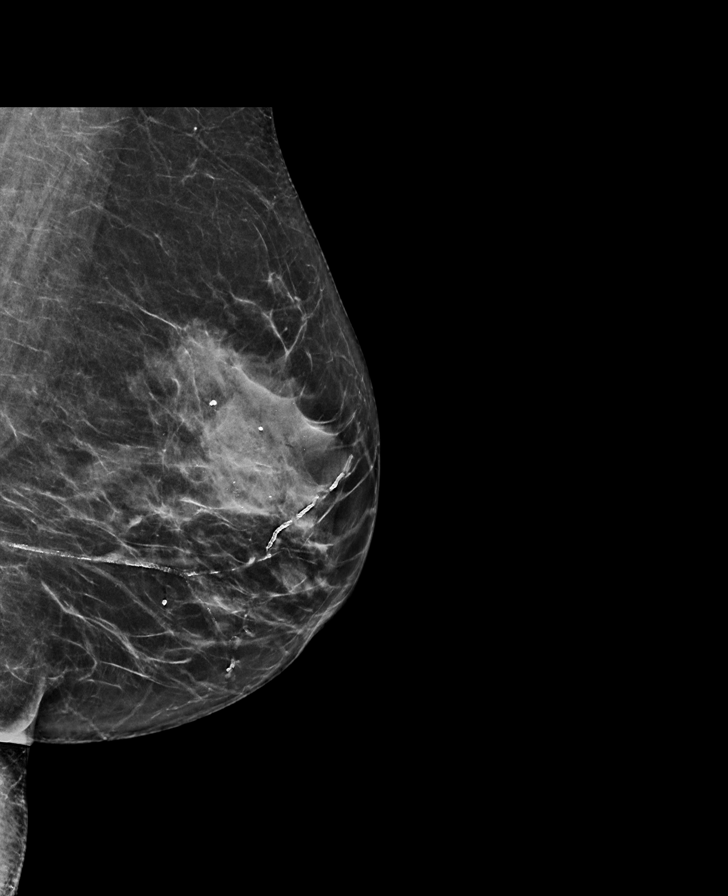

[L CC synth-2D]
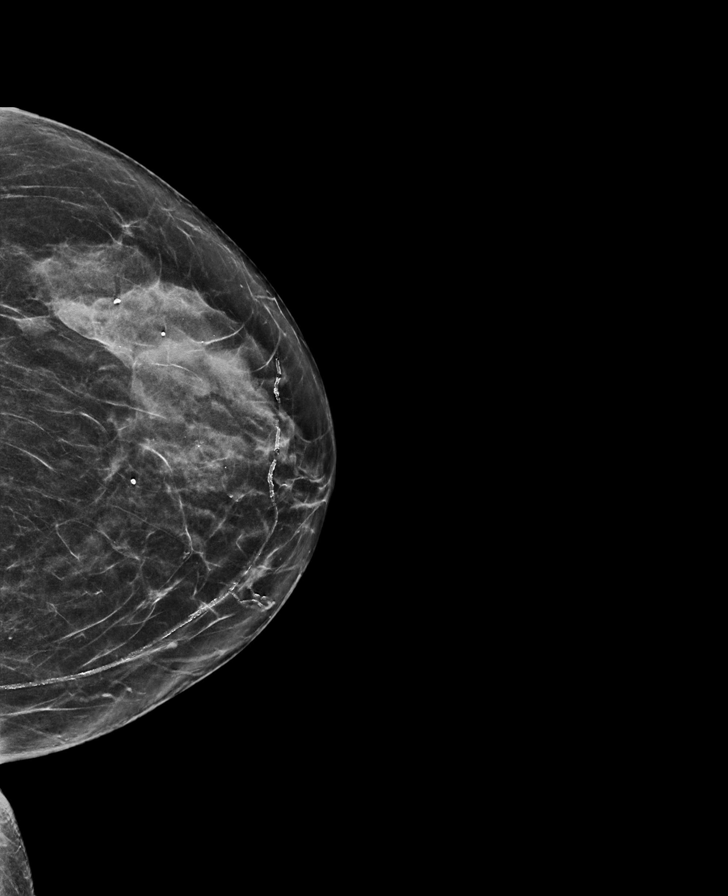

[L CC tomo · tomo slice 33/64.0]
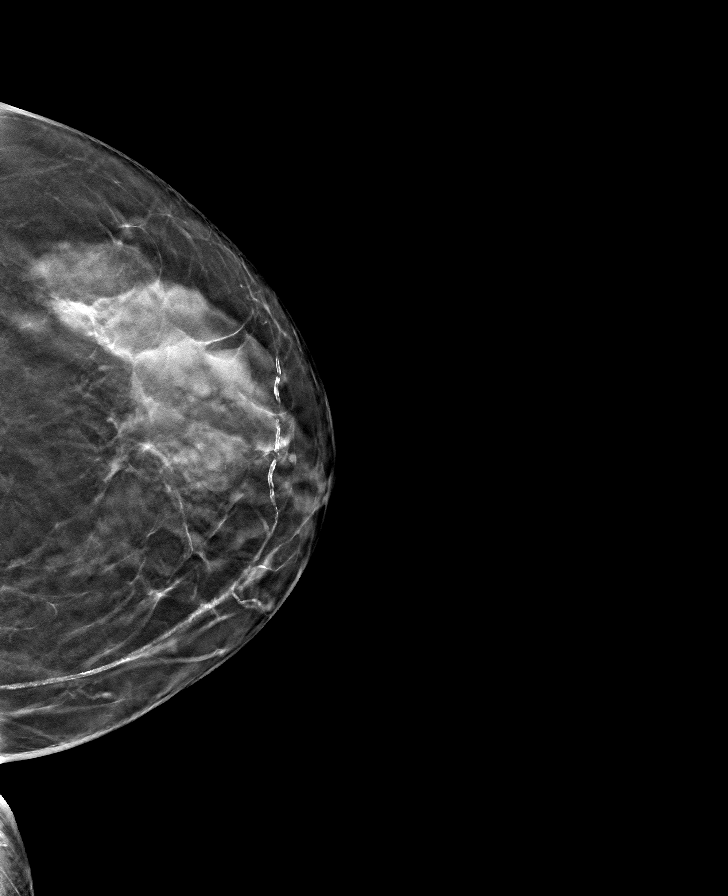

[R MLO tomo · tomo slice 33/65.0]
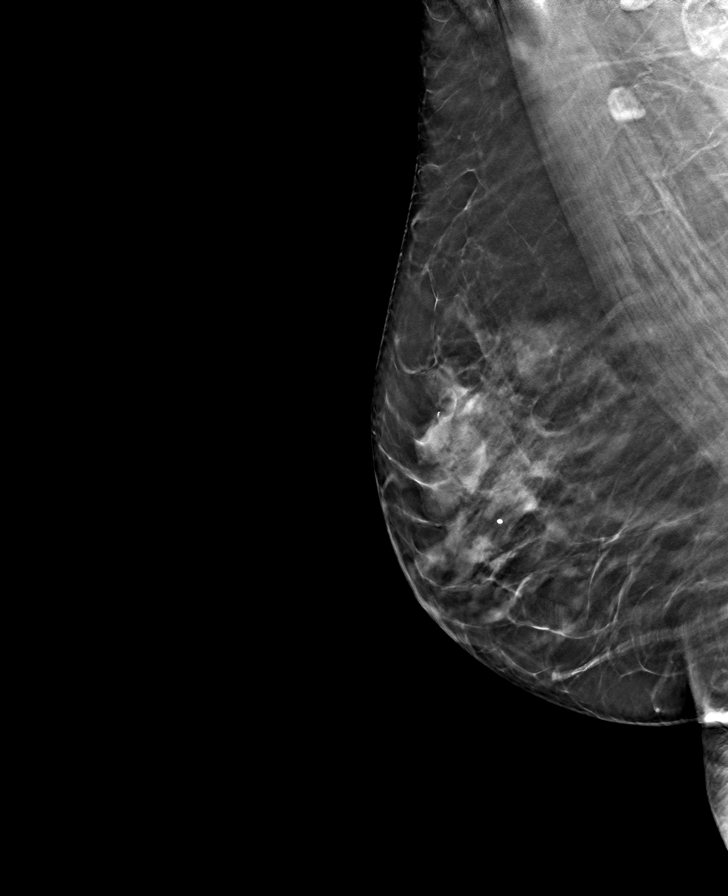

[L MLO tomo · tomo slice 33/64.0]
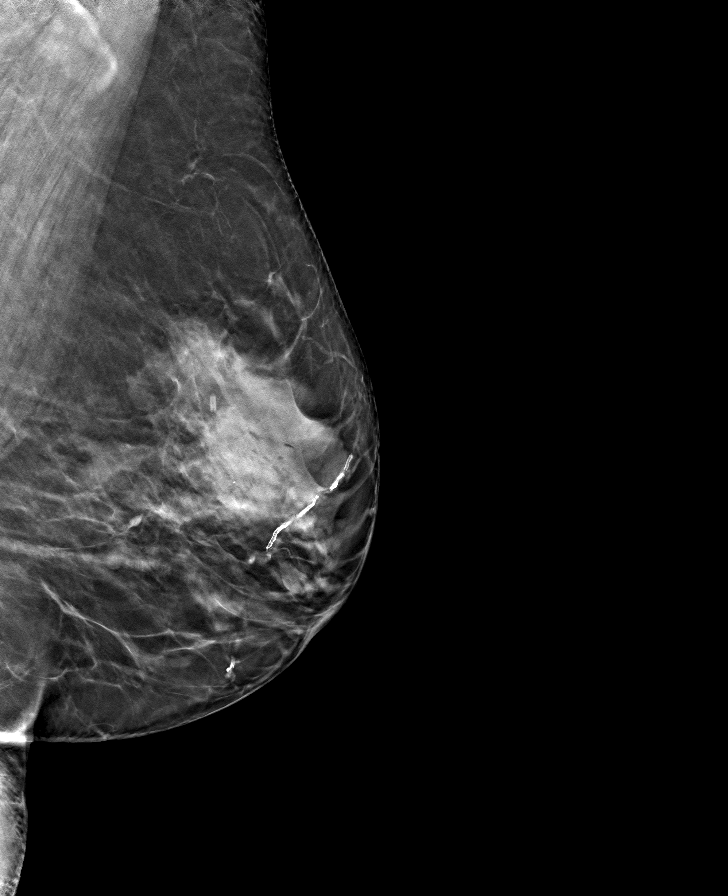

[R CC tomo · tomo slice 30/59.0]
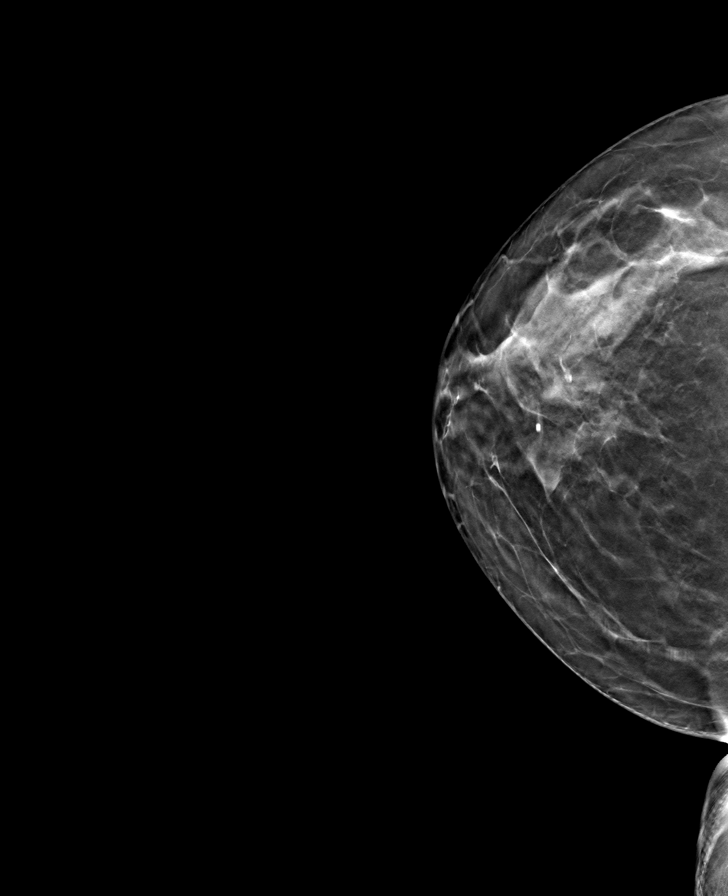

[8 of 24 positions shown; findings below may reference images not displayed]

ACR Breast Density Category d: The breast tissue is extremely dense,
which lowers the sensitivity of mammography
FINDINGS: There are no findings suspicious for malignancy. Images were
processed with CAD.
IMPRESSION: No mammographic evidence of malignancy. A result letter of this
screening mammogram will be mailed directly to the patient.

RECOMMENDATION:
Screening mammogram in one year. (Code:[id number])

BI-RADS CATEGORY  1: Negative.

## 2020-12-16 ENCOUNTER — Other Ambulatory Visit: Payer: Self-pay | Admitting: Family Medicine

## 2020-12-16 MED ORDER — IMIPRAMINE HCL 50 MG PO TABS: 100.0000 mg | ORAL_TABLET | Freq: Every day | ORAL | 0 refills | Status: DC

## 2020-12-16 NOTE — Telephone Encounter (Signed)
Medication Refill - Medication: imipramine  Has the patient contacted their pharmacy? Yes.   Pt states that she reached out to pharmacy, and they have not received a response in a week. Pt states that she is completely out. Please advise.  (Agent: If no, request that the patient contact the pharmacy for the refill.) (Agent: If yes, when and what did the pharmacy advise?)  Preferred Pharmacy (with phone number or street name):  [facility] - [place], [place] - [address]  [address] [place] [address]  Phone: [phone] Fax: [phone]  Hours: Not open 24 hours     Agent: Please be advised that RX refills may take up to 3 business days. We ask that you follow-up with your pharmacy.

## 2020-12-27 ENCOUNTER — Telehealth: Payer: Self-pay | Admitting: Family Medicine

## 2020-12-27 MED ORDER — IMIPRAMINE HCL 50 MG PO TABS
100.0000 mg | ORAL_TABLET | Freq: Every day | ORAL | 0 refills | Status: DC
Start: 2020-12-27 — End: 2021-02-24

## 2020-12-27 NOTE — Telephone Encounter (Signed)
Nadine with [facility] is calling needing an emergency refill short supply for med listed below  because postal service has delayed delivery / they have sent a shipment but its is delayed / patient took her last today . complteley out   imipramine (TOFRANIL) 50 MG tablet [[id number]   Please send to [facility] [address] in [place] [address]   [phone]

## 2020-12-27 NOTE — Telephone Encounter (Signed)
Gave Pt a 14 day supply until her supply gets in.

## 2021-01-02 ENCOUNTER — Other Ambulatory Visit: Payer: Self-pay | Admitting: Emergency Medicine

## 2021-01-02 NOTE — Telephone Encounter (Signed)
   Notes to clinic:  requesting 90 day supply    Requested Prescriptions  Pending Prescriptions Disp Refills   imipramine (TOFRANIL) 50 MG tablet [Pharmacy Med Name: IMIPRAMINE HCL 50 MG TABLET] 180 tablet 1    Sig: TAKE 2 TABLETS BY MOUTH AT BEDTIME.      Psychiatry:  Antidepressants - Heterocyclics (TCAs) Passed - 01/02/2021  8:30 AM      Passed - Valid encounter within last 6 months    Recent Outpatient Visits           4 months ago Essential hypertension   Primary Care at [facility], Meda Coffee, MD   6 months ago H/O hypokalemia   Primary Care at [facility], Meda Coffee, MD   6 months ago Essential hypertension   Primary Care at [facility], Meda Coffee, MD   6 months ago Essential hypertension   Primary Care at [facility], Meda Coffee, MD   6 months ago Hypotension, unspecified hypotension type   Primary Care at [facility], Eilleen Kempf, MD

## 2021-02-19 ENCOUNTER — Ambulatory Visit: Payer: Medicare PPO | Admitting: Internal Medicine

## 2021-02-19 ENCOUNTER — Encounter: Payer: Self-pay | Admitting: Internal Medicine

## 2021-02-19 ENCOUNTER — Other Ambulatory Visit: Payer: Self-pay

## 2021-02-19 VITALS — BP 120/80 | HR 74 | Temp 98.1°F | Resp 16 | Ht 62.0 in | Wt 169.4 lb

## 2021-02-19 DIAGNOSIS — K219 Gastro-esophageal reflux disease without esophagitis: Secondary | ICD-10-CM | POA: Diagnosis not present

## 2021-02-19 DIAGNOSIS — I119 Hypertensive heart disease without heart failure: Secondary | ICD-10-CM | POA: Diagnosis not present

## 2021-02-19 DIAGNOSIS — N1831 Chronic kidney disease, stage 3a: Secondary | ICD-10-CM

## 2021-02-19 DIAGNOSIS — Z0001 Encounter for general adult medical examination with abnormal findings: Secondary | ICD-10-CM

## 2021-02-19 DIAGNOSIS — R0600 Dyspnea, unspecified: Secondary | ICD-10-CM

## 2021-02-19 DIAGNOSIS — R0609 Other forms of dyspnea: Secondary | ICD-10-CM | POA: Insufficient documentation

## 2021-02-19 DIAGNOSIS — E785 Hyperlipidemia, unspecified: Secondary | ICD-10-CM | POA: Diagnosis not present

## 2021-02-19 DIAGNOSIS — I10 Essential (primary) hypertension: Secondary | ICD-10-CM | POA: Diagnosis not present

## 2021-02-19 DIAGNOSIS — G43709 Chronic migraine without aura, not intractable, without status migrainosus: Secondary | ICD-10-CM

## 2021-02-19 DIAGNOSIS — I5189 Other ill-defined heart diseases: Secondary | ICD-10-CM

## 2021-02-19 DIAGNOSIS — Z0000 Encounter for general adult medical examination without abnormal findings: Secondary | ICD-10-CM

## 2021-02-19 HISTORY — DX: Chronic kidney disease, stage 3a: N18.31

## 2021-02-19 HISTORY — DX: Hypertensive heart disease without heart failure: I11.9

## 2021-02-19 HISTORY — DX: Hyperlipidemia, unspecified: E78.5

## 2021-02-19 LAB — TROPONIN I (HIGH SENSITIVITY): High Sens Troponin I: 4 ng/L (ref 2–17)

## 2021-02-19 LAB — BASIC METABOLIC PANEL
BUN: 13 mg/dL (ref 6–23)
CO2: 28 mEq/L (ref 19–32)
Calcium: 8.8 mg/dL (ref 8.4–10.5)
Chloride: 109 mEq/L (ref 96–112)
Creatinine, Ser: 1.05 mg/dL (ref 0.40–1.20)
GFR: 55.05 mL/min — ABNORMAL LOW (ref >60.00)
Glucose, Bld: 81 mg/dL (ref 70–99)
Potassium: 3.7 mEq/L (ref 3.5–5.1)
Sodium: 142 mEq/L (ref 135–145)

## 2021-02-19 LAB — CBC WITH DIFFERENTIAL/PLATELET
Basophils Absolute: 0 10*3/uL (ref 0.0–0.1)
Basophils Relative: 0.3 % (ref 0.0–3.0)
Eosinophils Absolute: 0 10*3/uL (ref 0.0–0.7)
Eosinophils Relative: 0 % (ref 0.0–5.0)
HCT: 43.5 % (ref 36.0–46.0)
Hemoglobin: 14.5 g/dL (ref 12.0–15.0)
Lymphocytes Relative: 35.8 % (ref 12.0–46.0)
Lymphs Abs: 1.9 10*3/uL (ref 0.7–4.0)
MCHC: 33.4 g/dL (ref 30.0–36.0)
MCV: 88 fl (ref 78.0–100.0)
Monocytes Absolute: 0.4 10*3/uL (ref 0.1–1.0)
Monocytes Relative: 8.5 % (ref 3.0–12.0)
Neutro Abs: 2.9 10*3/uL (ref 1.4–7.7)
Neutrophils Relative %: 55.4 % (ref 43.0–77.0)
Platelets: 169 10*3/uL (ref 150.0–400.0)
RBC: 4.95 Mil/uL (ref 3.87–5.11)
RDW: 14.1 % (ref 11.5–15.5)
WBC: 5.3 10*3/uL (ref 4.0–10.5)

## 2021-02-19 LAB — LIPID PANEL
Cholesterol: 162 mg/dL (ref 0–200)
HDL: 48.3 mg/dL (ref >39.00)
LDL Cholesterol: 96 mg/dL (ref 0–99)
NonHDL: 114.15
Total CHOL/HDL Ratio: 3
Triglycerides: 93 mg/dL (ref 0.0–149.0)
VLDL: 18.6 mg/dL (ref 0.0–40.0)

## 2021-02-19 LAB — BRAIN NATRIURETIC PEPTIDE: Pro B Natriuretic peptide (BNP): 44 pg/mL (ref 0.0–100.0)

## 2021-02-19 MED ORDER — PANTOPRAZOLE SODIUM 40 MG PO TBEC: 40.0000 mg | DELAYED_RELEASE_TABLET | Freq: Every day | ORAL | 1 refills | Status: DC

## 2021-02-19 MED ORDER — METOPROLOL SUCCINATE ER 25 MG PO TB24: 25.0000 mg | ORAL_TABLET | Freq: Every day | ORAL | 1 refills | Status: DC

## 2021-02-19 MED ORDER — TOPIRAMATE 50 MG PO TABS: 50.0000 mg | ORAL_TABLET | Freq: Two times a day (BID) | ORAL | 0 refills | Status: DC

## 2021-02-19 MED ORDER — DULOXETINE HCL 30 MG PO CPEP: 30.0000 mg | ORAL_CAPSULE | Freq: Every day | ORAL | 1 refills | Status: DC

## 2021-02-19 NOTE — Patient Instructions (Signed)

## 2021-02-19 NOTE — Progress Notes (Addendum)
Subjective:  Patient ID: Kelli Contreras, female    DOB: 1951/08/26  Age: 70 y.o. MRN: [id number]  CC: New Patient (Initial Visit) (Med refill), Hypertension, Cough, and Annual Exam  This visit occurred during the SARS-CoV-2 public health emergency.  Safety protocols were in place, including screening questions prior to the visit, additional usage of staff PPE, and extensive cleaning of exam room while observing appropriate contact time as indicated for disinfecting solutions.    HPI Kelli Contreras presents for a CPX and to establish.  She had a Covid infection about a year ago.  Since then she has had a chronic, intermittent, nonproductive cough. She has also noticed dyspnea on exertion but she says that has improved recently.  She denies chest pain or diaphoresis with activity.  Her father died from an MI.  She denies fever, chills, hemoptysis, night sweats, edema, or palpitations.  She is taking an ACE inhibitor.  She has a history of migraine headaches and uses sumatriptan and and takes topiramate.  History Kelli Contreras has a past medical history of Cataract, Depression, Diarrhea, Hypertension, IBS (irritable bowel syndrome), Insomnia, Migraine, Osteoporosis, and Plantar fasciitis.   She has a past surgical history that includes Vesicovaginal fistula closure w/ TAH (1985); Bunionectomy (2007); Abdominal hysterectomy; Breast cyst aspiration (Bilateral); and Eye surgery (N/A).   Her family history includes Breast cancer in her maternal aunt; CAD (age of onset: 52) in her father; Cancer in her father; Diabetes in her father; Heart disease in her father and mother; Hyperlipidemia in her father and mother; Hypertension in her brother, father, mother, and another family member; Valvular heart disease in her mother.She reports that she has never smoked. She has never used smokeless tobacco. She reports that she does not drink alcohol and does not use drugs.  Outpatient Medications Prior to Visit   Medication Sig Dispense Refill  . aspirin 81 MG tablet Take 81 mg by mouth at bedtime.     . SUMAtriptan (IMITREX) 100 MG tablet Take 1/2 tab (50mg ) at onset of migraine. May take other half tab in 2 hours if headache persists or recurs. 9 tablet 4  . zolpidem (AMBIEN) 5 MG tablet Take 1 tablet (5 mg total) by mouth at bedtime as needed. (Patient taking differently: Take 5 mg by mouth at bedtime as needed for sleep.) 60 tablet 0  . aspirin-acetaminophen-caffeine (EXCEDRIN MIGRAINE) 250-250-65 MG tablet Take 2 tablets by mouth as needed for headache.    . DULoxetine (CYMBALTA) 30 MG capsule Take 1 capsule (30 mg total) by mouth daily. 90 capsule 1  . imipramine (TOFRANIL) 50 MG tablet Take 2 tablets (100 mg total) by mouth at bedtime. 28 tablet 0  . lisinopril (ZESTRIL) 40 MG tablet Take 1 tablet (40 mg total) by mouth daily. 90 tablet 1  . metoprolol succinate (TOPROL-XL) 25 MG 24 hr tablet Take 1 tablet (25 mg total) by mouth at bedtime. 90 tablet 1  . pantoprazole (PROTONIX) 40 MG tablet Take 1 tablet (40 mg total) by mouth daily. Office visit for further refills 90 tablet 0  . topiramate (TOPAMAX) 100 MG tablet Take 1 tablet (100 mg total) by mouth 2 (two) times daily. 180 tablet 1   No facility-administered medications prior to visit.    ROS Review of Systems  Constitutional: Negative for appetite change, chills, diaphoresis, fatigue and fever.  HENT: Negative.  Negative for trouble swallowing and voice change.   Eyes: Negative.   Respiratory: Positive for cough and shortness of breath.  Negative for choking, chest tightness, wheezing and stridor.   Cardiovascular: Negative for chest pain, palpitations and leg swelling.  Gastrointestinal: Negative for abdominal pain, constipation, diarrhea, nausea and vomiting.  Endocrine: Negative.   Genitourinary: Negative.  Negative for difficulty urinating.  Musculoskeletal: Negative.  Negative for arthralgias and myalgias.  Skin: Negative.   Negative for color change and pallor.  Neurological: Negative.  Negative for dizziness, weakness, light-headedness and numbness.  Hematological: Negative for adenopathy. Does not bruise/bleed easily.  Psychiatric/Behavioral: Negative.     Objective:  BP 120/80 (BP Location: Right Arm, Patient Position: Sitting, Cuff Size: Large)   Pulse 74   Temp 98.1 F (36.7 C) (Oral)   Resp 16   Ht 5\' 2"  (1.575 m)   Wt 169 lb 6.4 oz (76.8 kg)   SpO2 99%   BMI 30.98 kg/m   Physical Exam Vitals reviewed.  Constitutional:      General: She is not in acute distress.    Appearance: Normal appearance. She is not ill-appearing, toxic-appearing or diaphoretic.  HENT:     Nose: Nose normal.     Mouth/Throat:     Mouth: Mucous membranes are moist.  Eyes:     General: No scleral icterus.    Conjunctiva/sclera: Conjunctivae normal.  Cardiovascular:     Rate and Rhythm: Normal rate and regular rhythm.     Heart sounds: Normal heart sounds, S1 normal and S2 normal. No murmur heard. No friction rub. No gallop.      Comments: EKG- NSR, 73 bpm Minimal LVH No Q waves Overall this EKG looks better than her previous EKG Pulmonary:     Effort: Pulmonary effort is normal.     Breath sounds: No stridor. No wheezing, rhonchi or rales.  Abdominal:     General: Abdomen is flat. Bowel sounds are normal. There is no distension.     Palpations: Abdomen is soft. There is no hepatomegaly, splenomegaly or mass.     Tenderness: There is no abdominal tenderness.  Musculoskeletal:        General: Normal range of motion.     Cervical back: Neck supple.     Right lower leg: No edema.     Left lower leg: No edema.  Lymphadenopathy:     Cervical: No cervical adenopathy.  Skin:    General: Skin is warm and dry.     Findings: No rash.  Neurological:     General: No focal deficit present.     Mental Status: She is alert.  Psychiatric:        Mood and Affect: Mood normal.        Behavior: Behavior normal.      Lab Results  Component Value Date   WBC 5.3 02/19/2021   HGB 14.5 02/19/2021   HCT 43.5 02/19/2021   PLT 169.0 02/19/2021   GLUCOSE 81 02/19/2021   CHOL 162 02/19/2021   TRIG 93.0 02/19/2021   HDL 48.30 02/19/2021   LDLCALC 96 02/19/2021   ALT 9 06/12/2020   AST 16 06/12/2020   NA 142 02/19/2021   K 3.7 02/19/2021   CL 109 02/19/2021   CREATININE 1.05 02/19/2021   BUN 13 02/19/2021   CO2 28 02/19/2021   TSH 1.550 06/12/2020   HGBA1C 5.4 02/18/2015   MPI Notes Recorded by Rollene RotundaJames Hochrein, MD on 10/12/2012 at 1:26 PM No evidence of ischemia or infarct and the EF was OK. .Call Ms. Ow with the results and send   Assessment & Plan:  Kelli Contreras was seen today for new patient (initial visit), hypertension, cough and annual exam.  Diagnoses and all orders for this visit:  Gastroesophageal reflux disease without esophagitis- Her symptoms are well controlled with the PPI. -     pantoprazole (PROTONIX) 40 MG tablet; Take 1 tablet (40 mg total) by mouth daily.  Chronic migraine without aura without status migrainosus, not intractable- Her renal function has declined some so I recommended that she decrease the dose of topiramate. -     DULoxetine (CYMBALTA) 30 MG capsule; Take 1 capsule (30 mg total) by mouth daily. -     topiramate (TOPAMAX) 50 MG tablet; Take 1 tablet (50 mg total) by mouth 2 (two) times daily.  LVH (left ventricular hypertrophy) due to hypertensive disease, without heart failure- Her EKG and labs are reassuring.  She has a family history of CAD so I recommended that she undergo a CT cardiac scoring.  I have also recommended an echocardiogram to see if she has LVH or wall motion abnormalities. -     CT CARDIAC SCORING (SELF PAY ONLY); Future -     Brain natriuretic peptide; Future -     Troponin I (High Sensitivity); Future -     Basic metabolic panel; Future -     CBC with Differential/Platelet; Future -     CBC with Differential/Platelet -     Basic  metabolic panel -     Troponin I (High Sensitivity) -     Brain natriuretic peptide -     metoprolol succinate (TOPROL-XL) 25 MG 24 hr tablet; Take 1 tablet (25 mg total) by mouth at bedtime. -     ECHOCARDIOGRAM COMPLETE; Future  Primary hypertension- Her blood pressure is well controlled but I think she has a chronic cough from the ACE inhibitor.  I recommended that she stop taking lisinopril.  Will continue the current dose of metoprolol. -     Basic metabolic panel; Future -     CBC with Differential/Platelet; Future -     CBC with Differential/Platelet -     Basic metabolic panel -     metoprolol succinate (TOPROL-XL) 25 MG 24 hr tablet; Take 1 tablet (25 mg total) by mouth at bedtime. -     EKG 12-Lead  DOE (dyspnea on exertion)- See above. -     CT CARDIAC SCORING (SELF PAY ONLY); Future -     Brain natriuretic peptide; Future -     Troponin I (High Sensitivity); Future -     CBC with Differential/Platelet; Future -     CBC with Differential/Platelet -     Troponin I (High Sensitivity) -     Brain natriuretic peptide -     ECHOCARDIOGRAM COMPLETE; Future  Hyperlipidemia with target LDL less than 130- She has a low ASCVD risk score.  Statin therapy is not indicated. -     Lipid panel; Future -     Lipid panel  Stage 3a chronic kidney disease (HCC)- She has mild renal insufficiency.  I recommended she avoid nephrotoxic agents.  Will decrease the dose of topiramate.  Her blood pressure is adequately well controlled.  Encounter for general adult medical examination with abnormal findings- Exam completed, labs reviewed, she refused a pneumonia vaccine, cancer screenings are up-to-date, patient education was given.   I have discontinued Kelli Contreras's lisinopril, topiramate, and aspirin-acetaminophen-caffeine. I have also changed her pantoprazole. Additionally, I am having her start on topiramate. Lastly, I am having her maintain her  aspirin, zolpidem, SUMAtriptan, DULoxetine,  and metoprolol succinate.  Meds ordered this encounter  Medications  . DULoxetine (CYMBALTA) 30 MG capsule    Sig: Take 1 capsule (30 mg total) by mouth daily.    Dispense:  90 capsule    Refill:  1  . pantoprazole (PROTONIX) 40 MG tablet    Sig: Take 1 tablet (40 mg total) by mouth daily.    Dispense:  90 tablet    Refill:  1    Office visit for further refills  . metoprolol succinate (TOPROL-XL) 25 MG 24 hr tablet    Sig: Take 1 tablet (25 mg total) by mouth at bedtime.    Dispense:  90 tablet    Refill:  1  . topiramate (TOPAMAX) 50 MG tablet    Sig: Take 1 tablet (50 mg total) by mouth 2 (two) times daily.    Dispense:  180 tablet    Refill:  0   In addition to time spent on CPE, I spent 48 minutes in preparing to see the patient by review of recent labs, obtaining and reviewing separately obtained history, communicating with the patient , ordering medications, tests or procedures, and documenting clinical information in the EHR including the differential Dx, treatment, and any further evaluation and other management of 1. Chronic migraine without aura without status migrainosus, not intractable 2. Gastroesophageal reflux disease without esophagitis 3. LVH (left ventricular hypertrophy) due to hypertensive disease, without heart failure 4. Primary hypertension 5. DOE (dyspnea on exertion) 6. Hyperlipidemia with target LDL less than 130 7. Stage 3a chronic kidney disease (HCC)      Follow-up: Return in about 3 months (around 05/21/2021).  Sanda Linger, MD

## 2021-02-21 DIAGNOSIS — Z0001 Encounter for general adult medical examination with abnormal findings: Secondary | ICD-10-CM

## 2021-02-21 HISTORY — DX: Encounter for general adult medical examination with abnormal findings: Z00.01

## 2021-02-24 ENCOUNTER — Other Ambulatory Visit: Payer: Self-pay | Admitting: Internal Medicine

## 2021-02-24 DIAGNOSIS — G43709 Chronic migraine without aura, not intractable, without status migrainosus: Secondary | ICD-10-CM

## 2021-02-24 MED ORDER — IMIPRAMINE HCL 50 MG PO TABS: 100.0000 mg | ORAL_TABLET | Freq: Every day | ORAL | 1 refills | Status: DC

## 2021-03-12 ENCOUNTER — Telehealth: Payer: Self-pay | Admitting: Internal Medicine

## 2021-03-12 NOTE — Telephone Encounter (Signed)
Called pt, LVM.   

## 2021-03-12 NOTE — Telephone Encounter (Signed)
yes

## 2021-03-12 NOTE — Telephone Encounter (Signed)
   Patient calling to report she tested positive for COVID 5/2 She currently feels weak and coughing She would like to report her blood pressure today is 140/104 She wants to know if any of her medications need to be adjusted   Please advise

## 2021-03-19 ENCOUNTER — Inpatient Hospital Stay: Admission: RE | Admit: 2021-03-19 | Payer: Self-pay | Source: Ambulatory Visit

## 2021-03-25 ENCOUNTER — Ambulatory Visit (HOSPITAL_COMMUNITY): Payer: Medicare PPO | Attending: Cardiology

## 2021-03-25 ENCOUNTER — Ambulatory Visit (INDEPENDENT_AMBULATORY_CARE_PROVIDER_SITE_OTHER)
Admission: RE | Admit: 2021-03-25 | Discharge: 2021-03-25 | Disposition: A | Discharge status: Home / Self Care | Payer: Self-pay | Source: Ambulatory Visit | Attending: Internal Medicine | Admitting: Internal Medicine

## 2021-03-25 ENCOUNTER — Other Ambulatory Visit: Payer: Self-pay

## 2021-03-25 DIAGNOSIS — I5189 Other ill-defined heart diseases: Secondary | ICD-10-CM | POA: Insufficient documentation

## 2021-03-25 DIAGNOSIS — R0600 Dyspnea, unspecified: Secondary | ICD-10-CM | POA: Diagnosis present

## 2021-03-25 DIAGNOSIS — I119 Hypertensive heart disease without heart failure: Secondary | ICD-10-CM | POA: Insufficient documentation

## 2021-03-25 DIAGNOSIS — R0609 Other forms of dyspnea: Secondary | ICD-10-CM

## 2021-03-25 HISTORY — DX: Other ill-defined heart diseases: I51.89

## 2021-03-25 LAB — ECHOCARDIOGRAM COMPLETE
Area-P 1/2: 3.6 cm2
S' Lateral: 2.6 cm

## 2021-03-26 MED ORDER — ATORVASTATIN CALCIUM 20 MG PO TABS: 20.0000 mg | ORAL_TABLET | Freq: Every day | ORAL | 1 refills | Status: DC

## 2021-03-26 NOTE — Addendum Note (Signed)
Addended by: Etta Grandchild on: 03/26/2021 11:14 AM   Modules accepted: Orders

## 2021-06-02 ENCOUNTER — Other Ambulatory Visit: Payer: Self-pay | Admitting: Internal Medicine

## 2021-06-02 DIAGNOSIS — G43709 Chronic migraine without aura, not intractable, without status migrainosus: Secondary | ICD-10-CM

## 2021-07-08 ENCOUNTER — Ambulatory Visit: Payer: Medicare PPO | Admitting: Internal Medicine

## 2021-07-08 ENCOUNTER — Other Ambulatory Visit: Payer: Self-pay

## 2021-07-08 ENCOUNTER — Encounter: Payer: Self-pay | Admitting: Internal Medicine

## 2021-07-08 VITALS — BP 146/98 | HR 96 | Temp 98.2°F | Ht 62.0 in | Wt 172.0 lb

## 2021-07-08 DIAGNOSIS — N1831 Chronic kidney disease, stage 3a: Secondary | ICD-10-CM

## 2021-07-08 DIAGNOSIS — I119 Hypertensive heart disease without heart failure: Secondary | ICD-10-CM

## 2021-07-08 DIAGNOSIS — I10 Essential (primary) hypertension: Secondary | ICD-10-CM | POA: Diagnosis not present

## 2021-07-08 LAB — BASIC METABOLIC PANEL
BUN: 14 mg/dL (ref 6–23)
CO2: 28 mEq/L (ref 19–32)
Calcium: 9.9 mg/dL (ref 8.4–10.5)
Chloride: 105 mEq/L (ref 96–112)
Creatinine, Ser: 1.15 mg/dL (ref 0.40–1.20)
GFR: 49.22 mL/min — ABNORMAL LOW (ref >60.00)
Glucose, Bld: 94 mg/dL (ref 70–99)
Potassium: 3.6 mEq/L (ref 3.5–5.1)
Sodium: 141 mEq/L (ref 135–145)

## 2021-07-08 LAB — TSH: TSH: 3.43 u[IU]/mL (ref 0.35–5.50)

## 2021-07-08 MED ORDER — OLMESARTAN MEDOXOMIL 20 MG PO TABS
20.0000 mg | ORAL_TABLET | Freq: Every day | ORAL | 0 refills | Status: DC
Start: 2021-07-08 — End: 2021-08-19

## 2021-07-08 NOTE — Progress Notes (Signed)
Subjective:  Patient ID: Kelli Contreras, female    DOB: [birth year]  Age: 70 y.o. MRN: [id number]  CC: Hypertension  This visit occurred during the SARS-CoV-2 public health emergency.  Safety protocols were in place, including screening questions prior to the visit, additional usage of staff PPE, and extensive cleaning of exam room while observing appropriate contact time as indicated for disinfecting solutions.    HPI Kelli Contreras presents for f/up -  She has recently been monitoring her blood pressure and she has had some numbers that were 158/102, 207/118, 183/94, and 171/104.  She is active and denies CP, DOE, palpitations, edema, fatigue, headache, or edema.  She is taking metoprolol.  She was previously treated with a thiazide diuretic but it caused hypokalemia.  Outpatient Medications Prior to Visit  Medication Sig Dispense Refill   aspirin 81 MG tablet Take 81 mg by mouth at bedtime.      atorvastatin (LIPITOR) 20 MG tablet Take 1 tablet (20 mg total) by mouth daily. 90 tablet 1   DULoxetine (CYMBALTA) 30 MG capsule Take 1 capsule (30 mg total) by mouth daily. 90 capsule 1   imipramine (TOFRANIL) 50 MG tablet Take 2 tablets (100 mg total) by mouth at bedtime. 180 tablet 1   metoprolol succinate (TOPROL-XL) 25 MG 24 hr tablet Take 1 tablet (25 mg total) by mouth at bedtime. 90 tablet 1   pantoprazole (PROTONIX) 40 MG tablet Take 1 tablet (40 mg total) by mouth daily. 90 tablet 1   SUMAtriptan (IMITREX) 100 MG tablet Take 1/2 tab (50mg ) at onset of migraine. May take other half tab in 2 hours if headache persists or recurs. 9 tablet 4   topiramate (TOPAMAX) 50 MG tablet TAKE 1 TABLET (50 MG TOTAL) BY MOUTH 2 (TWO) TIMES DAILY. 180 tablet 0   zolpidem (AMBIEN) 5 MG tablet Take 1 tablet (5 mg total) by mouth at bedtime as needed. (Patient taking differently: Take 5 mg by mouth at bedtime as needed for sleep.) 60 tablet 0   No facility-administered medications prior to visit.     ROS Review of Systems  Constitutional:  Negative for diaphoresis, fatigue and unexpected weight change.  HENT: Negative.    Eyes:  Negative for visual disturbance.  Respiratory:  Negative for cough, chest tightness, shortness of breath and wheezing.   Cardiovascular:  Negative for chest pain, palpitations and leg swelling.  Gastrointestinal:  Negative for abdominal pain, constipation, diarrhea, nausea and vomiting.  Endocrine: Negative.   Genitourinary: Negative.  Negative for difficulty urinating and hematuria.  Musculoskeletal:  Negative for arthralgias and myalgias.  Skin:  Negative for color change and pallor.  Neurological:  Negative for dizziness, weakness, light-headedness and headaches.  Hematological:  Negative for adenopathy. Does not bruise/bleed easily.  Psychiatric/Behavioral: Negative.     Objective:  BP (!) 146/98 (BP Location: Left Arm, Patient Position: Sitting, Cuff Size: Large)   Pulse 96   Temp 98.2 F (36.8 C) (Oral)   Ht 5\' 2"  (1.575 m)   Wt 172 lb (78 kg)   SpO2 99%   BMI 31.46 kg/m   BP Readings from Last 3 Encounters:  07/08/21 (!) 146/98  02/19/21 120/80  08/13/20 (!) 142/89    Wt Readings from Last 3 Encounters:  07/08/21 172 lb (78 kg)  02/19/21 169 lb 6.4 oz (76.8 kg)  08/13/20 173 lb 3.2 oz (78.6 kg)    Physical Exam Vitals reviewed.  HENT:     Nose: Nose normal.  Mouth/Throat:     Mouth: Mucous membranes are moist.  Eyes:     Conjunctiva/sclera: Conjunctivae normal.  Cardiovascular:     Rate and Rhythm: Normal rate and regular rhythm.     Heart sounds: No murmur heard. Pulmonary:     Effort: Pulmonary effort is normal.     Breath sounds: No stridor. No wheezing, rhonchi or rales.  Abdominal:     General: Abdomen is flat.     Palpations: There is no mass.     Tenderness: There is no abdominal tenderness. There is no guarding.     Hernia: No hernia is present.  Musculoskeletal:        General: Normal range of motion.      Cervical back: Neck supple.     Right lower leg: No edema.     Left lower leg: No edema.  Lymphadenopathy:     Cervical: No cervical adenopathy.  Skin:    General: Skin is warm and dry.  Neurological:     General: No focal deficit present.     Mental Status: She is alert.  Psychiatric:        Mood and Affect: Mood normal.        Behavior: Behavior normal.    Lab Results  Component Value Date   WBC 5.3 02/19/2021   HGB 14.5 02/19/2021   HCT 43.5 02/19/2021   PLT 169.0 02/19/2021   GLUCOSE 94 07/08/2021   CHOL 162 02/19/2021   TRIG 93.0 02/19/2021   HDL 48.30 02/19/2021   LDLCALC 96 02/19/2021   ALT 9 06/12/2020   AST 16 06/12/2020   NA 141 07/08/2021   K 3.6 07/08/2021   CL 105 07/08/2021   CREATININE 1.15 07/08/2021   BUN 14 07/08/2021   CO2 28 07/08/2021   TSH 3.43 07/08/2021   HGBA1C 5.4 02/18/2015    CT CARDIAC SCORING (SELF PAY ONLY)  Addendum Date: 04/11/2021   ADDENDUM REPORT: 04/11/2021 14:24 EXAM: OVER-READ INTERPRETATION  CT CHEST The following report is an over-read performed by radiologist Dr. Lesia HausenJason Poffof [facility] on 04/11/2021. This over-read does not include interpretation of cardiac or coronary anatomy or pathology. The coronary CTA interpretation by the cardiologist is attached. COMPARISON:  None. FINDINGS: Cardiovascular: Normal heart size. No significant pericardial effusion/thickening. Atherosclerotic nonaneurysmal thoracic aorta. Normal caliber pulmonary arteries. Mediastinum/Nodes: Unremarkable esophagus. No pathologically enlarged mediastinal or hilar lymph nodes, noting limited sensitivity for the detection of hilar adenopathy on this noncontrast study. Lungs/Pleura: No pneumothorax. No pleural effusion. Perifissural 5 mm medial right middle lobe solid pulmonary nodule (series 3/image 7), considered benign. Solid 7 mm posterior left lower lobe pulmonary nodule (series 3/image 11). No acute consolidative airspace disease. Upper abdomen: No  acute abnormality. Musculoskeletal: No aggressive appearing focal osseous lesions. Moderate thoracic spondylosis. IMPRESSION: 1. Solid 7 mm posterior left lower lobe pulmonary nodule. Non-contrast chest CT at 6-12 months is recommended. If the nodule is stable at time of repeat CT, then future CT at 18-24 months (from today's scan) is considered optional for low-risk patients, but is recommended for high-risk patients. This recommendation follows the consensus statement: Guidelines for Management of Incidental Pulmonary Nodules Detected on CT Images: From the Fleischner Society 2017; Radiology 2017; 284:228-243. 2. Aortic Atherosclerosis (ICD10-I70.0). Electronically Signed   By: Delbert PhenixJason A Poff M.D.   On: 04/11/2021 14:24   Result Date: 04/11/2021 CLINICAL DATA:  Cardiovascular Disease Risk stratification EXAM: Coronary Calcium Score TECHNIQUE: A gated, non-contrast computed tomography scan of the heart was performed  using 3mm slice thickness. Axial images were analyzed on a dedicated workstation. Calcium scoring of the coronary arteries was performed using the Agatston method. FINDINGS: Coronary arteries: Normal origins. Coronary Calcium Score: Left main: 8.34 Left anterior descending artery: 109 Left circumflex artery: 0 Right coronary artery: 0 Total: 117 Percentile: 80th Pericardium: Normal. Ascending Aorta: Normal caliber.  Scattered calcifications. Non-cardiac: See separate report from [facility]. IMPRESSION: Coronary calcium score of 117. This was 80th percentile for age-, race-, and sex-matched controls. RECOMMENDATIONS: Coronary artery calcium (CAC) score is a strong predictor of incident coronary heart disease (CHD) and provides predictive information beyond traditional risk factors. CAC scoring is reasonable to use in the decision to withhold, postpone, or initiate statin therapy in intermediate-risk or selected borderline-risk asymptomatic adults (age 63-75 years and LDL-C >=70 to <190 mg/dL)  who do not have diabetes or established atherosclerotic cardiovascular disease (ASCVD).* In intermediate-risk (10-year ASCVD risk >=7.5% to <20%) adults or selected borderline-risk (10-year ASCVD risk >=5% to <7.5%) adults in whom a CAC score is measured for the purpose of making a treatment decision the following recommendations have been made: If CAC=0, it is reasonable to withhold statin therapy and reassess in 5 to 10 years, as long as higher risk conditions are absent (diabetes mellitus, family history of premature CHD in first degree relatives (males <55 years; females <65 years), cigarette smoking, or LDL >=190 mg/dL). If CAC is 1 to 99, it is reasonable to initiate statin therapy for patients >=38 years of age. If CAC is >=100 or >=75th percentile, it is reasonable to initiate statin therapy at any age. Cardiology referral should be considered for patients with CAC scores >=400 or >=75th percentile. *2018 AHA/ACC/AACVPR/AAPA/ABC/ACPM/ADA/AGS/APhA/ASPC/NLA/PCNA Guideline on the Management of Blood Cholesterol: A Report of the American College of Cardiology/American Heart Association Task Force on Clinical Practice Guidelines. J Am Coll Cardiol. 2019;73(24):3168-3209. Armanda Magic, MD Electronically Signed: By: Armanda Magic On: 03/25/2021 15:14   ECHOCARDIOGRAM COMPLETE  Result Date: 03/25/2021    ECHOCARDIOGRAM REPORT   Patient Name:   NAKESHIA WALDECK Date of Exam: 03/25/2021 Medical Rec #:  [id number]       Height:       62.0 in Accession #:    [id number]      Weight:       169.4 lb Date of Birth:  [birth year]      BSA:          1.781 m Patient Age:    69 years        BP:           120/80 mmHg Patient Gender: F               HR:           82 bpm. Exam Location:  [address] Procedure: 2D Echo, Cardiac Doppler and Color Doppler Indications:    R94.31 Abnormal EKG  History:        Patient has prior history of Echocardiogram examinations, most                 recent 01/20/2018. Risk Factors:Hypertension.   Sonographer:    Sedonia Small Rodgers-Jasher Barkan RDCS Referring Phys: [id number] Rheya Minogue L Yaakov Saindon IMPRESSIONS  1. Left ventricular ejection fraction, by estimation, is 60 to 65%. The left ventricle has normal function. The left ventricle has no regional wall motion abnormalities. There is mild left ventricular hypertrophy. Left ventricular diastolic parameters are consistent with Grade I diastolic dysfunction (impaired relaxation).  2. Right ventricular systolic  function is normal. The right ventricular size is normal.  3. The mitral valve is normal in structure. No evidence of mitral valve regurgitation. No evidence of mitral stenosis.  4. The aortic valve is tricuspid. Aortic valve regurgitation is mild. Mild aortic valve sclerosis is present, with no evidence of aortic valve stenosis.  5. The inferior vena cava is normal in size with greater than 50% respiratory variability, suggesting right atrial pressure of 3 mmHg. FINDINGS  Left Ventricle: Left ventricular ejection fraction, by estimation, is 60 to 65%. The left ventricle has normal function. The left ventricle has no regional wall motion abnormalities. The left ventricular internal cavity size was normal in size. There is  mild left ventricular hypertrophy. Left ventricular diastolic parameters are consistent with Grade I diastolic dysfunction (impaired relaxation). Right Ventricle: The right ventricular size is normal. Right ventricular systolic function is normal. Left Atrium: Left atrial size was normal in size. Right Atrium: Right atrial size was normal in size. Pericardium: There is no evidence of pericardial effusion. Mitral Valve: The mitral valve is normal in structure. No evidence of mitral valve regurgitation. No evidence of mitral valve stenosis. Tricuspid Valve: The tricuspid valve is normal in structure. Tricuspid valve regurgitation is trivial. No evidence of tricuspid stenosis. Aortic Valve: The aortic valve is tricuspid. Aortic valve regurgitation is mild.  Mild aortic valve sclerosis is present, with no evidence of aortic valve stenosis. Pulmonic Valve: The pulmonic valve was not well visualized. Pulmonic valve regurgitation is not visualized. No evidence of pulmonic stenosis. Aorta: The aortic root is normal in size and structure. Venous: The inferior vena cava is normal in size with greater than 50% respiratory variability, suggesting right atrial pressure of 3 mmHg. IAS/Shunts: No atrial level shunt detected by color flow Doppler.  LEFT VENTRICLE PLAX 2D LVIDd:         4.00 cm  Diastology LVIDs:         2.60 cm  LV e' medial:    4.46 cm/s LV PW:         1.20 cm  LV E/e' medial:  13.5 LV IVS:        1.20 cm  LV e' lateral:   7.40 cm/s LVOT diam:     2.30 cm  LV E/e' lateral: 8.1 LV SV:         77 LV SV Index:   43 LVOT Area:     4.15 cm  RIGHT VENTRICLE RV Basal diam:  3.40 cm RV S prime:     9.64 cm/s TAPSE (M-mode): 1.8 cm LEFT ATRIUM             Index       RIGHT ATRIUM          Index LA diam:        4.30 cm 2.41 cm/m  RA Area:     8.76 cm LA Vol (A2C):   30.9 ml 17.35 ml/m RA Volume:   18.40 ml 10.33 ml/m LA Vol (A4C):   29.9 ml 16.78 ml/m LA Biplane Vol: 31.7 ml 17.79 ml/m  AORTIC VALVE LVOT Vmax:   87.60 cm/s LVOT Vmean:  61.950 cm/s LVOT VTI:    0.186 m  AORTA Ao Root diam: 3.50 cm Ao Asc diam:  3.40 cm MITRAL VALVE MV Area (PHT): 3.60 cm    SHUNTS MV Decel Time: 211 msec    Systemic VTI:  0.19 m MV E velocity: 60.00 cm/s  Systemic Diam: 2.30 cm MV A velocity: 83.60 cm/s  MV E/A ratio:  0.72 Olga Millers MD Electronically signed by Olga Millers MD Signature Date/Time: 03/25/2021/3:22:29 PM    Final     Assessment & Plan:   Athalie was seen today for hypertension.  Diagnoses and all orders for this visit:  Primary hypertension- Her blood pressure is not adequately well controlled.  Will check labs to screen for secondary causes and endorgan damage.  I recommended that she add an ARB to the beta-blocker. -     Aldosterone + renin activity w/  ratio; Future -     TSH; Future -     Urinalysis, Routine w reflex microscopic; Future -     Basic metabolic panel; Future -     olmesartan (BENICAR) 20 MG tablet; Take 1 tablet (20 mg total) by mouth daily. -     Basic metabolic panel -     Urinalysis, Routine w reflex microscopic -     TSH -     Aldosterone + renin activity w/ ratio  LVH (left ventricular hypertrophy) due to hypertensive disease, without heart failure- She has a normal volume status. -     olmesartan (BENICAR) 20 MG tablet; Take 1 tablet (20 mg total) by mouth daily.  Stage 3a chronic kidney disease (HCC)- Her renal function is stable.  She will avoid nephrotoxic agents.  Will try to get better control of her blood pressure.  I am having Apurva F. Salce start on olmesartan. I am also having her maintain her aspirin, zolpidem, SUMAtriptan, DULoxetine, pantoprazole, metoprolol succinate, imipramine, atorvastatin, and topiramate.  Meds ordered this encounter  Medications   olmesartan (BENICAR) 20 MG tablet    Sig: Take 1 tablet (20 mg total) by mouth daily.    Dispense:  90 tablet    Refill:  0      Follow-up: Return in about 3 months (around 10/08/2021).  Sanda Linger, MD

## 2021-07-08 NOTE — Patient Instructions (Signed)

## 2021-07-09 LAB — URINALYSIS, ROUTINE W REFLEX MICROSCOPIC
Nitrite: NEGATIVE
Specific Gravity, Urine: 1.025 (ref 1.000–1.030)
Total Protein, Urine: 30 — AB
Urine Glucose: NEGATIVE
Urobilinogen, UA: 1 (ref 0.0–1.0)
pH: 6 (ref 5.0–8.0)

## 2021-07-16 LAB — ALDOSTERONE + RENIN ACTIVITY W/ RATIO
ALDO / PRA Ratio: 53.8 Ratio — ABNORMAL HIGH (ref 0.9–28.9)
Aldosterone: 7 ng/dL
Renin Activity: 0.13 ng/mL/h — ABNORMAL LOW (ref 0.25–5.82)

## 2021-08-19 ENCOUNTER — Telehealth: Payer: Self-pay | Admitting: Internal Medicine

## 2021-08-19 ENCOUNTER — Other Ambulatory Visit: Payer: Self-pay | Admitting: Internal Medicine

## 2021-08-19 DIAGNOSIS — G43709 Chronic migraine without aura, not intractable, without status migrainosus: Secondary | ICD-10-CM

## 2021-08-19 DIAGNOSIS — I10 Essential (primary) hypertension: Secondary | ICD-10-CM

## 2021-08-19 DIAGNOSIS — E785 Hyperlipidemia, unspecified: Secondary | ICD-10-CM

## 2021-08-19 DIAGNOSIS — I119 Hypertensive heart disease without heart failure: Secondary | ICD-10-CM

## 2021-08-19 MED ORDER — METOPROLOL SUCCINATE ER 25 MG PO TB24: 25.0000 mg | ORAL_TABLET | Freq: Every day | ORAL | 0 refills | Status: DC

## 2021-08-19 MED ORDER — OLMESARTAN MEDOXOMIL 20 MG PO TABS
20.0000 mg | ORAL_TABLET | Freq: Every day | ORAL | 0 refills | Status: DC
Start: 2021-08-19 — End: 2021-10-05

## 2021-08-19 MED ORDER — ATORVASTATIN CALCIUM 20 MG PO TABS: 20.0000 mg | ORAL_TABLET | Freq: Every day | ORAL | 0 refills | Status: DC

## 2021-08-19 MED ORDER — IMIPRAMINE HCL 50 MG PO TABS: 100.0000 mg | ORAL_TABLET | Freq: Every day | ORAL | 0 refills | Status: DC

## 2021-08-19 MED ORDER — TOPIRAMATE 50 MG PO TABS: 50.0000 mg | ORAL_TABLET | Freq: Two times a day (BID) | ORAL | 0 refills | Status: DC

## 2021-08-19 MED ORDER — DULOXETINE HCL 30 MG PO CPEP: 30.0000 mg | ORAL_CAPSULE | Freq: Every day | ORAL | 0 refills | Status: DC

## 2021-08-19 NOTE — Telephone Encounter (Signed)
1.Medication Requested: atorvastatin (LIPITOR) 20 MG tablet imipramine (TOFRANIL) 50 MG tablet metoprolol succinate (TOPROL-XL) 25 MG 24 hr tablet DULoxetine (CYMBALTA) 30 MG capsule olmesartan (BENICAR) 20 MG tablet   2. Pharmacy (Name, Street, [facility]): [facility] - [place], [place] - [address]  Phone:  [phone] Fax:  [phone]   3. On Med List: yes  4. Last Visit with PCP: 08.30.22  5. Next visit date with PCP: 11.16.22   Agent: Please be advised that RX refills may take up to 3 business days. We ask that you follow-up with your pharmacy.

## 2021-09-03 DIAGNOSIS — E669 Obesity, unspecified: Secondary | ICD-10-CM | POA: Diagnosis not present

## 2021-09-03 DIAGNOSIS — M810 Age-related osteoporosis without current pathological fracture: Secondary | ICD-10-CM | POA: Diagnosis not present

## 2021-09-03 DIAGNOSIS — I10 Essential (primary) hypertension: Secondary | ICD-10-CM | POA: Diagnosis not present

## 2021-09-03 DIAGNOSIS — G43909 Migraine, unspecified, not intractable, without status migrainosus: Secondary | ICD-10-CM | POA: Diagnosis not present

## 2021-09-03 DIAGNOSIS — M8580 Other specified disorders of bone density and structure, unspecified site: Secondary | ICD-10-CM | POA: Diagnosis not present

## 2021-09-03 DIAGNOSIS — E785 Hyperlipidemia, unspecified: Secondary | ICD-10-CM | POA: Diagnosis not present

## 2021-09-03 DIAGNOSIS — K219 Gastro-esophageal reflux disease without esophagitis: Secondary | ICD-10-CM | POA: Diagnosis not present

## 2021-09-03 DIAGNOSIS — G4700 Insomnia, unspecified: Secondary | ICD-10-CM | POA: Diagnosis not present

## 2021-09-03 DIAGNOSIS — F419 Anxiety disorder, unspecified: Secondary | ICD-10-CM | POA: Diagnosis not present

## 2021-09-12 DIAGNOSIS — J111 Influenza due to unidentified influenza virus with other respiratory manifestations: Secondary | ICD-10-CM | POA: Diagnosis not present

## 2021-09-24 ENCOUNTER — Encounter: Payer: Self-pay | Admitting: Internal Medicine

## 2021-09-24 ENCOUNTER — Other Ambulatory Visit: Payer: Self-pay

## 2021-09-24 ENCOUNTER — Ambulatory Visit: Payer: Medicare PPO | Admitting: Internal Medicine

## 2021-09-24 VITALS — BP 138/84 | HR 78 | Temp 98.5°F | Resp 16 | Ht 62.0 in | Wt 176.0 lb

## 2021-09-24 DIAGNOSIS — R55 Syncope and collapse: Secondary | ICD-10-CM | POA: Diagnosis not present

## 2021-09-24 DIAGNOSIS — I10 Essential (primary) hypertension: Secondary | ICD-10-CM | POA: Diagnosis not present

## 2021-09-24 DIAGNOSIS — H43391 Other vitreous opacities, right eye: Secondary | ICD-10-CM | POA: Diagnosis not present

## 2021-09-24 DIAGNOSIS — G43709 Chronic migraine without aura, not intractable, without status migrainosus: Secondary | ICD-10-CM

## 2021-09-24 DIAGNOSIS — I119 Hypertensive heart disease without heart failure: Secondary | ICD-10-CM | POA: Diagnosis not present

## 2021-09-24 NOTE — Progress Notes (Signed)
Subjective:  Patient ID: Kelli Contreras, female    DOB: [birth year]  Age: 70 y.o. MRN: [id number]  CC: Hypertension  This visit occurred during the SARS-CoV-2 public health emergency.  Safety protocols were in place, including screening questions prior to the visit, additional usage of staff PPE, and extensive cleaning of exam room while observing appropriate contact time as indicated for disinfecting solutions.    HPI Kelli Contreras presents for f/up  She has had 2 syncopal episodes - The last one was about 1 month ago. She fell and injured her left face but did not seek treatment. She just goes down suddenly with no warning and no LOC or postictal state. She had one episode when she did not lift her left leg enough and trip over one step but she denies ataxia, paresthesias, or slurred speech. She has had right eye floaters.  Outpatient Medications Prior to Visit  Medication Sig Dispense Refill   aspirin 81 MG tablet Take 81 mg by mouth at bedtime.      atorvastatin (LIPITOR) 20 MG tablet Take 1 tablet (20 mg total) by mouth daily. 90 tablet 0   DULoxetine (CYMBALTA) 30 MG capsule Take 1 capsule (30 mg total) by mouth daily. 90 capsule 0   imipramine (TOFRANIL) 50 MG tablet Take 2 tablets (100 mg total) by mouth at bedtime. 180 tablet 0   metoprolol succinate (TOPROL-XL) 25 MG 24 hr tablet Take 1 tablet (25 mg total) by mouth at bedtime. 90 tablet 0   olmesartan (BENICAR) 20 MG tablet Take 1 tablet (20 mg total) by mouth daily. 90 tablet 0   pantoprazole (PROTONIX) 40 MG tablet Take 1 tablet (40 mg total) by mouth daily. 90 tablet 1   SUMAtriptan (IMITREX) 100 MG tablet Take 1/2 tab (50mg ) at onset of migraine. May take other half tab in 2 hours if headache persists or recurs. 9 tablet 4   topiramate (TOPAMAX) 50 MG tablet Take 1 tablet (50 mg total) by mouth 2 (two) times daily. 180 tablet 0   zolpidem (AMBIEN) 5 MG tablet Take 1 tablet (5 mg total) by mouth at bedtime as needed.  (Patient taking differently: Take 5 mg by mouth at bedtime as needed for sleep.) 60 tablet 0   No facility-administered medications prior to visit.    ROS Review of Systems  Constitutional:  Negative for diaphoresis and fatigue.  HENT: Negative.    Eyes:  Positive for visual disturbance. Negative for photophobia.  Respiratory:  Negative for cough, chest tightness, shortness of breath and wheezing.   Cardiovascular:  Negative for chest pain, palpitations and leg swelling.  Gastrointestinal:  Negative for abdominal pain and diarrhea.  Genitourinary: Negative.   Musculoskeletal: Negative.  Negative for back pain and neck pain.  Skin: Negative.   Neurological:  Positive for syncope. Negative for dizziness, tremors, seizures, facial asymmetry, speech difficulty, weakness, light-headedness, numbness and headaches.  Hematological:  Negative for adenopathy. Does not bruise/bleed easily.  Psychiatric/Behavioral: Negative.     Objective:  BP 138/84 (BP Location: Left Arm, Patient Position: Sitting, Cuff Size: Large)   Pulse 78   Temp 98.5 F (36.9 C) (Oral)   Resp 16   Ht 5\' 2"  (1.575 m)   Wt 176 lb (79.8 kg)   SpO2 99%   BMI 32.19 kg/m   BP Readings from Last 3 Encounters:  09/24/21 138/84  07/08/21 (!) 146/98  02/19/21 120/80    Wt Readings from Last 3 Encounters:  09/24/21 176 lb (79.8  kg)  07/08/21 172 lb (78 kg)  02/19/21 169 lb 6.4 oz (76.8 kg)    Physical Exam Vitals reviewed.  HENT:     Nose: Nose normal.     Mouth/Throat:     Mouth: Mucous membranes are moist.  Eyes:     General: No scleral icterus.    Conjunctiva/sclera: Conjunctivae normal.  Cardiovascular:     Rate and Rhythm: Normal rate and regular rhythm.     Heart sounds: Normal heart sounds, S1 normal and S2 normal. No murmur heard.   No gallop.     Comments: EKG- NSR, 73 bpm Normal EKG Pulmonary:     Effort: Pulmonary effort is normal.     Breath sounds: No stridor. No wheezing, rhonchi or rales.   Abdominal:     General: Abdomen is flat.     Palpations: There is no mass.     Tenderness: There is no abdominal tenderness. There is no guarding.     Hernia: No hernia is present.  Musculoskeletal:     Right lower leg: No edema.     Left lower leg: No edema.  Skin:    General: Skin is warm and dry.     Coloration: Skin is not pale.  Neurological:     General: No focal deficit present.     Mental Status: She is alert and oriented to person, place, and time. Mental status is at baseline.     Sensory: No sensory deficit.     Motor: No weakness.     Coordination: Coordination normal.     Gait: Gait normal.     Deep Tendon Reflexes: Reflexes normal.  Psychiatric:        Mood and Affect: Mood normal.        Behavior: Behavior normal.    Lab Results  Component Value Date   WBC 5.3 02/19/2021   HGB 14.5 02/19/2021   HCT 43.5 02/19/2021   PLT 169.0 02/19/2021   GLUCOSE 94 07/08/2021   CHOL 162 02/19/2021   TRIG 93.0 02/19/2021   HDL 48.30 02/19/2021   LDLCALC 96 02/19/2021   ALT 9 06/12/2020   AST 16 06/12/2020   NA 141 07/08/2021   K 3.6 07/08/2021   CL 105 07/08/2021   CREATININE 1.15 07/08/2021   BUN 14 07/08/2021   CO2 28 07/08/2021   TSH 3.43 07/08/2021   HGBA1C 5.4 02/18/2015    CT CARDIAC SCORING (SELF PAY ONLY)  Addendum Date: 04/11/2021   ADDENDUM REPORT: 04/11/2021 14:24 EXAM: OVER-READ INTERPRETATION  CT CHEST The following report is an over-read performed by radiologist Dr. Samara Snide [facility], [place] on 04/11/2021. This over-read does not include interpretation of cardiac or coronary anatomy or pathology. The coronary CTA interpretation by the cardiologist is attached. COMPARISON:  None. FINDINGS: Cardiovascular: Normal heart size. No significant pericardial effusion/thickening. Atherosclerotic nonaneurysmal thoracic aorta. Normal caliber pulmonary arteries. Mediastinum/Nodes: Unremarkable esophagus. No pathologically enlarged mediastinal or hilar lymph  nodes, noting limited sensitivity for the detection of hilar adenopathy on this noncontrast study. Lungs/Pleura: No pneumothorax. No pleural effusion. Perifissural 5 mm medial right middle lobe solid pulmonary nodule (series 3/image 7), considered benign. Solid 7 mm posterior left lower lobe pulmonary nodule (series 3/image 11). No acute consolidative airspace disease. Upper abdomen: No acute abnormality. Musculoskeletal: No aggressive appearing focal osseous lesions. Moderate thoracic spondylosis. IMPRESSION: 1. Solid 7 mm posterior left lower lobe pulmonary nodule. Non-contrast chest CT at 6-12 months is recommended. If the nodule is stable at time of  repeat CT, then future CT at 18-24 months (from today's scan) is considered optional for low-risk patients, but is recommended for high-risk patients. This recommendation follows the consensus statement: Guidelines for Management of Incidental Pulmonary Nodules Detected on CT Images: From the Fleischner Society 2017; Radiology 2017; 284:228-243. 2. Aortic Atherosclerosis (ICD10-I70.0). Electronically Signed   By: Ilona Sorrel M.D.   On: 04/11/2021 14:24   Result Date: 04/11/2021 CLINICAL DATA:  Cardiovascular Disease Risk stratification EXAM: Coronary Calcium Score TECHNIQUE: A gated, non-contrast computed tomography scan of the heart was performed using 70mm slice thickness. Axial images were analyzed on a dedicated workstation. Calcium scoring of the coronary arteries was performed using the Agatston method. FINDINGS: Coronary arteries: Normal origins. Coronary Calcium Score: Left main: 8.34 Left anterior descending artery: 109 Left circumflex artery: 0 Right coronary artery: 0 Total: 117 Percentile: 80th Pericardium: Normal. Ascending Aorta: Normal caliber.  Scattered calcifications. Non-cardiac: See separate report from [facility]. IMPRESSION: Coronary calcium score of 117. This was 80th percentile for age-, race-, and sex-matched controls.  RECOMMENDATIONS: Coronary artery calcium (CAC) score is a strong predictor of incident coronary heart disease (CHD) and provides predictive information beyond traditional risk factors. CAC scoring is reasonable to use in the decision to withhold, postpone, or initiate statin therapy in intermediate-risk or selected borderline-risk asymptomatic adults (age 64-75 years and LDL-C >=70 to <190 mg/dL) who do not have diabetes or established atherosclerotic cardiovascular disease (ASCVD).* In intermediate-risk (10-year ASCVD risk >=7.5% to <20%) adults or selected borderline-risk (10-year ASCVD risk >=5% to <7.5%) adults in whom a CAC score is measured for the purpose of making a treatment decision the following recommendations have been made: If CAC=0, it is reasonable to withhold statin therapy and reassess in 5 to 10 years, as long as higher risk conditions are absent (diabetes mellitus, family history of premature CHD in first degree relatives (males <55 years; females <65 years), cigarette smoking, or LDL >=190 mg/dL). If CAC is 1 to 99, it is reasonable to initiate statin therapy for patients >=65 years of age. If CAC is >=100 or >=75th percentile, it is reasonable to initiate statin therapy at any age. Cardiology referral should be considered for patients with CAC scores >=400 or >=75th percentile. *2018 AHA/ACC/AACVPR/AAPA/ABC/ACPM/ADA/AGS/APhA/ASPC/NLA/PCNA Guideline on the Management of Blood Cholesterol: A Report of the American College of Cardiology/American Heart Association Task Force on Clinical Practice Guidelines. J Am Coll Cardiol. 2019;73(24):3168-3209. Fransico Him, MD Electronically Signed: By: Fransico Him On: 03/25/2021 15:14   ECHOCARDIOGRAM COMPLETE  Result Date: 03/25/2021    ECHOCARDIOGRAM REPORT   Patient Name:   MERRILEE HERMS Date of Exam: 03/25/2021 Medical Rec #:  [id number]       Height:       62.0 in Accession #:    [id number]      Weight:       169.4 lb Date of Birth:  06/28/1951       BSA:          1.781 m Patient Age:    89 years        BP:           120/80 mmHg Patient Gender: F               HR:           82 bpm. Exam Location:  [place] Procedure: 2D Echo, Cardiac Doppler and Color Doppler Indications:    R94.31 Abnormal EKG  History:  Patient has prior history of Echocardiogram examinations, most                 recent 01/20/2018. Risk Factors:Hypertension.  Sonographer:    Sedonia Small Rodgers-Ryanne Morand RDCS Referring Phys: [id number] Yesmin Mutch L Maclane Holloran IMPRESSIONS  1. Left ventricular ejection fraction, by estimation, is 60 to 65%. The left ventricle has normal function. The left ventricle has no regional wall motion abnormalities. There is mild left ventricular hypertrophy. Left ventricular diastolic parameters are consistent with Grade I diastolic dysfunction (impaired relaxation).  2. Right ventricular systolic function is normal. The right ventricular size is normal.  3. The mitral valve is normal in structure. No evidence of mitral valve regurgitation. No evidence of mitral stenosis.  4. The aortic valve is tricuspid. Aortic valve regurgitation is mild. Mild aortic valve sclerosis is present, with no evidence of aortic valve stenosis.  5. The inferior vena cava is normal in size with greater than 50% respiratory variability, suggesting right atrial pressure of 3 mmHg. FINDINGS  Left Ventricle: Left ventricular ejection fraction, by estimation, is 60 to 65%. The left ventricle has normal function. The left ventricle has no regional wall motion abnormalities. The left ventricular internal cavity size was normal in size. There is  mild left ventricular hypertrophy. Left ventricular diastolic parameters are consistent with Grade I diastolic dysfunction (impaired relaxation). Right Ventricle: The right ventricular size is normal. Right ventricular systolic function is normal. Left Atrium: Left atrial size was normal in size. Right Atrium: Right atrial size was normal in size. Pericardium: There  is no evidence of pericardial effusion. Mitral Valve: The mitral valve is normal in structure. No evidence of mitral valve regurgitation. No evidence of mitral valve stenosis. Tricuspid Valve: The tricuspid valve is normal in structure. Tricuspid valve regurgitation is trivial. No evidence of tricuspid stenosis. Aortic Valve: The aortic valve is tricuspid. Aortic valve regurgitation is mild. Mild aortic valve sclerosis is present, with no evidence of aortic valve stenosis. Pulmonic Valve: The pulmonic valve was not well visualized. Pulmonic valve regurgitation is not visualized. No evidence of pulmonic stenosis. Aorta: The aortic root is normal in size and structure. Venous: The inferior vena cava is normal in size with greater than 50% respiratory variability, suggesting right atrial pressure of 3 mmHg. IAS/Shunts: No atrial level shunt detected by color flow Doppler.  LEFT VENTRICLE PLAX 2D LVIDd:         4.00 cm  Diastology LVIDs:         2.60 cm  LV e' medial:    4.46 cm/s LV PW:         1.20 cm  LV E/e' medial:  13.5 LV IVS:        1.20 cm  LV e' lateral:   7.40 cm/s LVOT diam:     2.30 cm  LV E/e' lateral: 8.1 LV SV:         77 LV SV Index:   43 LVOT Area:     4.15 cm  RIGHT VENTRICLE RV Basal diam:  3.40 cm RV S prime:     9.64 cm/s TAPSE (M-mode): 1.8 cm LEFT ATRIUM             Index       RIGHT ATRIUM          Index LA diam:        4.30 cm 2.41 cm/m  RA Area:     8.76 cm LA Vol (A2C):   30.9 ml 17.35 ml/m RA Volume:  18.40 ml 10.33 ml/m LA Vol (A4C):   29.9 ml 16.78 ml/m LA Biplane Vol: 31.7 ml 17.79 ml/m  AORTIC VALVE LVOT Vmax:   87.60 cm/s LVOT Vmean:  61.950 cm/s LVOT VTI:    0.186 m  AORTA Ao Root diam: 3.50 cm Ao Asc diam:  3.40 cm MITRAL VALVE MV Area (PHT): 3.60 cm    SHUNTS MV Decel Time: 211 msec    Systemic VTI:  0.19 m MV E velocity: 60.00 cm/s  Systemic Diam: 2.30 cm MV A velocity: 83.60 cm/s MV E/A ratio:  0.72 Kirk Ruths MD Electronically signed by Kirk Ruths MD Signature  Date/Time: 03/25/2021/3:22:29 PM    Final     Assessment & Plan:   Ebelyn was seen today for hypertension.  Diagnoses and all orders for this visit:  Syncope and collapse- I am concerned that there may be an arrhythmia. I have asked her to wear an event monitor. -     EKG 12-Lead -     CARDIAC EVENT MONITOR; Future  LVH (left ventricular hypertrophy) due to hypertensive disease, without heart failure- She has a normal volume status.  Chronic migraine without aura without status migrainosus, not intractable  Primary hypertension- Her BP is well controlled. -     EKG 12-Lead  Floaters, right -     Ambulatory referral to Ophthalmology  I am having Kamiyah F. Samonte maintain her aspirin, zolpidem, SUMAtriptan, pantoprazole, atorvastatin, DULoxetine, imipramine, metoprolol succinate, olmesartan, and topiramate.  No orders of the defined types were placed in this encounter.    Follow-up: Return in about 6 weeks (around 11/05/2021).  Scarlette Calico, MD

## 2021-09-24 NOTE — Patient Instructions (Signed)
Syncope, Adult °Syncope refers to a condition in which a person temporarily loses consciousness. Syncope may also be called fainting or passing out. It is caused by a sudden decrease in blood flow to the brain. This can happen for a variety of reasons. °Most causes of syncope are not dangerous. It can be triggered by things such as needle sticks, seeing blood, pain, or intense emotion. However, syncope can also be a sign of a serious medical problem, such as a heart abnormality. Other causes can include dehydration, migraines, or taking medicines that lower blood pressure. Your health care provider may do tests to find the reason why you are having syncope. °If you faint, get medical help right away. Call your local emergency services (911 in the [place] °Follow these instructions at home: °Pay attention to any changes in your symptoms. Take these actions to stay safe and to help relieve your symptoms: °Knowing when you may be about to faint °Signs that you may be about to faint include: °Feeling dizzy, weak, light-headed, or like the room is spinning. °Feeling nauseous. °Seeing spots or seeing all white or all black in your field of vision. °Having cold, clammy skin or feeling warm and sweaty. °Hearing ringing in the ears (tinnitus). °If you start to feel like you might faint, sit or lie down right away. If sitting, put your head down between your legs. If lying down, raise (elevate) your feet above the level of your heart. °Breathe deeply and steadily. Wait until all the symptoms have passed. °Have someone stay with you until you feel stable. °Medicines °Take over-the-counter and prescription medicines only as told by your health care provider. °If you are taking blood pressure or heart medicine, get up slowly and take several minutes to sit and then stand. This can reduce dizziness and decrease the risk of syncope. °Lifestyle °Do not drive, use machinery, or play sports until your health care provider says it is  okay. °Do not drink alcohol. °Do not use any products that contain nicotine or tobacco. These products include cigarettes, chewing tobacco, and vaping devices, such as e-cigarettes. If you need help quitting, ask your health care provider. °Avoid hot tubs and saunas. °General instructions °Talk with your health care provider about your symptoms. You may need to have testing to understand the cause of your syncope. °Drink enough fluid to keep your urine pale yellow. °Avoid prolonged standing. If you must stand for a long time, do movements such as: °Moving your legs. °Crossing your legs. °Flexing and stretching your leg muscles. °Squatting. °Keep all follow-up visits. This is important. °Contact a health care provider if: °You have episodes of near fainting. °Get help right away if: °You faint. °You hit your head or are injured after fainting. °You have any of these symptoms that may indicate trouble with your heart: °Fast or irregular heartbeats (palpitations). °Unusual pain in your chest, abdomen, or back. °Shortness of breath. °You have a seizure. °You have a severe headache. °You are confused. °You have vision problems. °You have severe weakness or trouble walking. °You are bleeding from your mouth or rectum, or you have black or tarry stool. °These symptoms may represent a serious problem that is an emergency. Do not wait to see if your symptoms will go away. Get medical help right away. Call your local emergency services (911 in the [place] Do not drive yourself to the hospital. °Summary °Syncope refers to a condition in which a person temporarily loses consciousness. Syncope may also be called fainting   or passing out. It is caused by a sudden decrease in blood flow to the brain. °Signs that you may be about to faint include dizziness, feeling light-headed, feeling nauseous, sudden vision changes, or cold, clammy skin. °Even though most causes of syncope are not dangerous, syncope can be a sign of a serious  medical problem. Get help right away if you faint. °If you start to feel like you might faint, sit or lie down right away. If sitting, put your head down between your legs. If lying down, raise (elevate) your feet above the level of your heart. °This information is not intended to replace advice given to you by your health care provider. Make sure you discuss any questions you have with your health care provider. °Document Revised: 03/06/2021 Document Reviewed: 03/06/2021 °Elsevier Patient Education © 2022 [employer] ° °

## 2021-09-25 ENCOUNTER — Other Ambulatory Visit: Payer: Self-pay

## 2021-10-01 DIAGNOSIS — H04123 Dry eye syndrome of bilateral lacrimal glands: Secondary | ICD-10-CM | POA: Diagnosis not present

## 2021-10-01 DIAGNOSIS — H3562 Retinal hemorrhage, left eye: Secondary | ICD-10-CM | POA: Diagnosis not present

## 2021-10-01 DIAGNOSIS — H35032 Hypertensive retinopathy, left eye: Secondary | ICD-10-CM | POA: Diagnosis not present

## 2021-10-01 DIAGNOSIS — H43391 Other vitreous opacities, right eye: Secondary | ICD-10-CM | POA: Diagnosis not present

## 2021-10-05 ENCOUNTER — Other Ambulatory Visit: Payer: Self-pay | Admitting: Internal Medicine

## 2021-10-05 DIAGNOSIS — I119 Hypertensive heart disease without heart failure: Secondary | ICD-10-CM

## 2021-10-05 DIAGNOSIS — I10 Essential (primary) hypertension: Secondary | ICD-10-CM

## 2021-10-13 DIAGNOSIS — H26491 Other secondary cataract, right eye: Secondary | ICD-10-CM | POA: Diagnosis not present

## 2021-10-20 ENCOUNTER — Other Ambulatory Visit: Payer: Self-pay | Admitting: Internal Medicine

## 2021-10-20 DIAGNOSIS — R911 Solitary pulmonary nodule: Secondary | ICD-10-CM

## 2021-10-20 HISTORY — DX: Solitary pulmonary nodule: R91.1

## 2021-10-24 ENCOUNTER — Ambulatory Visit: Payer: Medicare PPO

## 2021-10-24 ENCOUNTER — Other Ambulatory Visit: Payer: Self-pay

## 2021-10-24 ENCOUNTER — Ambulatory Visit (INDEPENDENT_AMBULATORY_CARE_PROVIDER_SITE_OTHER): Payer: Medicare PPO

## 2021-10-24 DIAGNOSIS — R55 Syncope and collapse: Secondary | ICD-10-CM | POA: Diagnosis not present

## 2021-10-27 ENCOUNTER — Inpatient Hospital Stay: Admission: RE | Admit: 2021-10-27 | Payer: Medicare PPO | Source: Ambulatory Visit

## 2021-10-28 ENCOUNTER — Ambulatory Visit (INDEPENDENT_AMBULATORY_CARE_PROVIDER_SITE_OTHER)
Admission: RE | Admit: 2021-10-28 | Discharge: 2021-10-28 | Disposition: A | Discharge status: Home / Self Care | Payer: Medicare PPO | Source: Ambulatory Visit | Attending: Internal Medicine | Admitting: Internal Medicine

## 2021-10-28 ENCOUNTER — Other Ambulatory Visit: Payer: Self-pay

## 2021-10-28 DIAGNOSIS — R911 Solitary pulmonary nodule: Secondary | ICD-10-CM

## 2021-10-28 DIAGNOSIS — I700 Atherosclerosis of aorta: Secondary | ICD-10-CM | POA: Diagnosis not present

## 2021-10-28 DIAGNOSIS — I728 Aneurysm of other specified arteries: Secondary | ICD-10-CM | POA: Diagnosis not present

## 2021-10-30 ENCOUNTER — Other Ambulatory Visit: Payer: Self-pay | Admitting: Internal Medicine

## 2021-10-30 DIAGNOSIS — I728 Aneurysm of other specified arteries: Secondary | ICD-10-CM

## 2021-10-30 HISTORY — DX: Aneurysm of other specified arteries: I72.8

## 2021-10-31 DIAGNOSIS — B309 Viral conjunctivitis, unspecified: Secondary | ICD-10-CM | POA: Diagnosis not present

## 2021-11-14 ENCOUNTER — Other Ambulatory Visit: Payer: Self-pay | Admitting: Internal Medicine

## 2021-11-14 DIAGNOSIS — E785 Hyperlipidemia, unspecified: Secondary | ICD-10-CM

## 2021-11-14 DIAGNOSIS — K219 Gastro-esophageal reflux disease without esophagitis: Secondary | ICD-10-CM

## 2021-11-14 DIAGNOSIS — G43709 Chronic migraine without aura, not intractable, without status migrainosus: Secondary | ICD-10-CM

## 2021-11-16 ENCOUNTER — Other Ambulatory Visit: Payer: Self-pay | Admitting: Internal Medicine

## 2021-11-16 DIAGNOSIS — I10 Essential (primary) hypertension: Secondary | ICD-10-CM

## 2021-11-16 DIAGNOSIS — I119 Hypertensive heart disease without heart failure: Secondary | ICD-10-CM

## 2021-11-16 DIAGNOSIS — G43709 Chronic migraine without aura, not intractable, without status migrainosus: Secondary | ICD-10-CM

## 2021-11-21 ENCOUNTER — Other Ambulatory Visit: Payer: Self-pay | Admitting: Internal Medicine

## 2021-11-21 DIAGNOSIS — I10 Essential (primary) hypertension: Secondary | ICD-10-CM

## 2021-11-21 DIAGNOSIS — I119 Hypertensive heart disease without heart failure: Secondary | ICD-10-CM

## 2021-11-24 DIAGNOSIS — B309 Viral conjunctivitis, unspecified: Secondary | ICD-10-CM | POA: Diagnosis not present

## 2021-12-25 DIAGNOSIS — R509 Fever, unspecified: Secondary | ICD-10-CM | POA: Diagnosis not present

## 2021-12-25 DIAGNOSIS — J0100 Acute maxillary sinusitis, unspecified: Secondary | ICD-10-CM | POA: Diagnosis not present

## 2021-12-25 DIAGNOSIS — R0981 Nasal congestion: Secondary | ICD-10-CM | POA: Diagnosis not present

## 2021-12-25 DIAGNOSIS — Z20828 Contact with and (suspected) exposure to other viral communicable diseases: Secondary | ICD-10-CM | POA: Diagnosis not present

## 2021-12-25 DIAGNOSIS — R051 Acute cough: Secondary | ICD-10-CM | POA: Diagnosis not present

## 2022-02-09 ENCOUNTER — Ambulatory Visit (INDEPENDENT_AMBULATORY_CARE_PROVIDER_SITE_OTHER): Payer: Medicare PPO

## 2022-02-09 DIAGNOSIS — Z0000 Encounter for general adult medical examination without abnormal findings: Secondary | ICD-10-CM

## 2022-02-09 NOTE — Patient Instructions (Signed)
Kelli Contreras , ?Thank you for taking time to come for your Medicare Wellness Visit. I appreciate your ongoing commitment to your health goals. Please review the following plan we discussed and let me know if I can assist you in the future.  ? ?Screening recommendations/referrals: ?Colonoscopy: 07/25/2018 ?Mammogram: patient will schedule  ?Bone Density: 03/29/2020 ?Recommended yearly ophthalmology/optometry visit for glaucoma screening and checkup ?Recommended yearly dental visit for hygiene and checkup ? ?Vaccinations: ?Influenza vaccine: declined  ?Pneumococcal vaccine: completed ?Tdap vaccine: 02/11/2017 ?Shingles vaccine: completed    ? ?Advanced directives: none  ? ?Conditions/risks identified: none  ? ?Next appointment: none  ? ? ?Preventive Care 69 Years and Older, Female ?Preventive care refers to lifestyle choices and visits with your health care provider that can promote health and wellness. ?What does preventive care include? ?A yearly physical exam. This is also called an annual well check. ?Dental exams once or twice a year. ?Routine eye exams. Ask your health care provider how often you should have your eyes checked. ?Personal lifestyle choices, including: ?Daily care of your teeth and gums. ?Regular physical activity. ?Eating a healthy diet. ?Avoiding tobacco and drug use. ?Limiting alcohol use. ?Practicing safe sex. ?Taking low-dose aspirin every day. ?Taking vitamin and mineral supplements as recommended by your health care provider. ?What happens during an annual well check? ?The services and screenings done by your health care provider during your annual well check will depend on your age, overall health, lifestyle risk factors, and family history of disease. ?Counseling  ?Your health care provider may ask you questions about your: ?Alcohol use. ?Tobacco use. ?Drug use. ?Emotional well-being. ?Home and relationship well-being. ?Sexual activity. ?Eating habits. ?History of falls. ?Memory and ability  to understand (cognition). ?Work and work Astronomer. ?Reproductive health. ?Screening  ?You may have the following tests or measurements: ?Height, weight, and BMI. ?Blood pressure. ?Lipid and cholesterol levels. These may be checked every 5 years, or more frequently if you are over 72 years old. ?Skin check. ?Lung cancer screening. You may have this screening every year starting at age 60 if you have a 30-pack-year history of smoking and currently smoke or have quit within the past 15 years. ?Fecal occult blood test (FOBT) of the stool. You may have this test every year starting at age 9. ?Flexible sigmoidoscopy or colonoscopy. You may have a sigmoidoscopy every 5 years or a colonoscopy every 10 years starting at age 23. ?Hepatitis C blood test. ?Hepatitis B blood test. ?Sexually transmitted disease (STD) testing. ?Diabetes screening. This is done by checking your blood sugar (glucose) after you have not eaten for a while (fasting). You may have this done every 1-3 years. ?Bone density scan. This is done to screen for osteoporosis. You may have this done starting at age 33. ?Mammogram. This may be done every 1-2 years. Talk to your health care provider about how often you should have regular mammograms. ?Talk with your health care provider about your test results, treatment options, and if necessary, the need for more tests. ?Vaccines  ?Your health care provider may recommend certain vaccines, such as: ?Influenza vaccine. This is recommended every year. ?Tetanus, diphtheria, and acellular pertussis (Tdap, Td) vaccine. You may need a Td booster every 10 years. ?Zoster vaccine. You may need this after age 77. ?Pneumococcal 13-valent conjugate (PCV13) vaccine. One dose is recommended after age 45. ?Pneumococcal polysaccharide (PPSV23) vaccine. One dose is recommended after age 79. ?Talk to your health care provider about which screenings and vaccines you need and how  often you need them. ?This information is not  intended to replace advice given to you by your health care provider. Make sure you discuss any questions you have with your health care provider. ?Document Released: 11/22/2015 Document Revised: 07/15/2016 Document Reviewed: 08/27/2015 ?Elsevier Interactive Patient Education ? 2017 [place]. ? ?Fall Prevention in the Home ?Falls can cause injuries. They can happen to people of all ages. There are many things you can do to make your home safe and to help prevent falls. ?What can I do on the outside of my home? ?Regularly fix the edges of walkways and driveways and fix any cracks. ?Remove anything that might make you trip as you walk through a door, such as a raised step or threshold. ?Trim any bushes or trees on the path to your home. ?Use bright outdoor lighting. ?Clear any walking paths of anything that might make someone trip, such as rocks or tools. ?Regularly check to see if handrails are loose or broken. Make sure that both sides of any steps have handrails. ?Any raised decks and porches should have guardrails on the edges. ?Have any leaves, snow, or ice cleared regularly. ?Use sand or salt on walking paths during winter. ?Clean up any spills in your garage right away. This includes oil or grease spills. ?What can I do in the bathroom? ?Use night lights. ?Install grab bars by the toilet and in the tub and shower. Do not use towel bars as grab bars. ?Use non-skid mats or decals in the tub or shower. ?If you need to sit down in the shower, use a plastic, non-slip stool. ?Keep the floor dry. Clean up any water that spills on the floor as soon as it happens. ?Remove soap buildup in the tub or shower regularly. ?Attach bath mats securely with double-sided non-slip rug tape. ?Do not have throw rugs and other things on the floor that can make you trip. ?What can I do in the bedroom? ?Use night lights. ?Make sure that you have a light by your bed that is easy to reach. ?Do not use any sheets or blankets that are  too big for your bed. They should not hang down onto the floor. ?Have a firm chair that has side arms. You can use this for support while you get dressed. ?Do not have throw rugs and other things on the floor that can make you trip. ?What can I do in the kitchen? ?Clean up any spills right away. ?Avoid walking on wet floors. ?Keep items that you use a lot in easy-to-reach places. ?If you need to reach something above you, use a strong step stool that has a grab bar. ?Keep electrical cords out of the way. ?Do not use floor polish or wax that makes floors slippery. If you must use wax, use non-skid floor wax. ?Do not have throw rugs and other things on the floor that can make you trip. ?What can I do with my stairs? ?Do not leave any items on the stairs. ?Make sure that there are handrails on both sides of the stairs and use them. Fix handrails that are broken or loose. Make sure that handrails are as long as the stairways. ?Check any carpeting to make sure that it is firmly attached to the stairs. Fix any carpet that is loose or worn. ?Avoid having throw rugs at the top or bottom of the stairs. If you do have throw rugs, attach them to the floor with carpet tape. ?Make sure that you have a  light switch at the top of the stairs and the bottom of the stairs. If you do not have them, ask someone to add them for you. ?What else can I do to help prevent falls? ?Wear shoes that: ?Do not have high heels. ?Have rubber bottoms. ?Are comfortable and fit you well. ?Are closed at the toe. Do not wear sandals. ?If you use a stepladder: ?Make sure that it is fully opened. Do not climb a closed stepladder. ?Make sure that both sides of the stepladder are locked into place. ?Ask someone to hold it for you, if possible. ?Clearly mark and make sure that you can see: ?Any grab bars or handrails. ?First and last steps. ?Where the edge of each step is. ?Use tools that help you move around (mobility aids) if they are needed. These  include: ?Canes. ?Walkers. ?Scooters. ?Crutches. ?Turn on the lights when you go into a dark area. Replace any light bulbs as soon as they burn out. ?Set up your furniture so you have a clear path. Avoid moving y

## 2022-02-09 NOTE — Progress Notes (Signed)
? ?Subjective:  ? Kelli Contreras is a 71 y.o. female who presents for Medicare Annual (Subsequent) preventive examination. ? ? ?I connected with Kelli Contreras today by telephone and verified that I am speaking with the correct person using two identifiers. ?Location patient: home ?Location provider: work ?Persons participating in the virtual visit: patient, provider. ?  ?I discussed the limitations, risks, security and privacy concerns of performing an evaluation and management service by telephone and the availability of in person appointments. I also discussed with the patient that there may be a patient responsible charge related to this service. The patient expressed understanding and verbally consented to this telephonic visit.  ?  ?Interactive audio and video telecommunications were attempted between this provider and patient, however failed, due to patient having technical difficulties OR patient did not have access to video capability.  We continued and completed visit with audio only. ? ?  ?Review of Systems    ? ?Cardiac Risk Factors include: advanced age (>57men, >84 women) ? ?   ?Objective:  ?  ?Today's Vitals  ? ?There is no height or weight on file to calculate BMI. ? ? ?  02/09/2022  ?  2:07 PM 02/27/2020  ?  2:16 PM 01/22/2020  ?  2:35 PM  ?Advanced Directives  ?Does Patient Have a Medical Advance Directive? No No No  ?Would patient like information on creating a medical advance directive? No - Patient declined No - Patient declined Yes (ED - Information included in AVS)  ? ? ?Current Medications (verified) ?Outpatient Encounter Medications as of 02/09/2022  ?Medication Sig  ? aspirin 81 MG tablet Take 81 mg by mouth at bedtime.   ? atorvastatin (LIPITOR) 20 MG tablet TAKE 1 TABLET EVERY DAY  ? DULoxetine (CYMBALTA) 30 MG capsule TAKE 1 CAPSULE EVERY DAY  ? imipramine (TOFRANIL) 50 MG tablet TAKE 2 TABLETS AT BEDTIME  ? metoprolol succinate (TOPROL-XL) 25 MG 24 hr tablet TAKE 1 TABLET AT BEDTIME  ?  olmesartan (BENICAR) 20 MG tablet TAKE 1 TABLET EVERY DAY  ? pantoprazole (PROTONIX) 40 MG tablet TAKE 1 TABLET EVERY DAY (NEED OFFICE VISIT FOR FURTHER REFILLS)  ? SUMAtriptan (IMITREX) 100 MG tablet Take 1/2 tab (50mg ) at onset of migraine. May take other half tab in 2 hours if headache persists or recurs.  ? topiramate (TOPAMAX) 50 MG tablet Take 1 tablet (50 mg total) by mouth 2 (two) times daily.  ? zolpidem (AMBIEN) 5 MG tablet Take 1 tablet (5 mg total) by mouth at bedtime as needed. (Patient taking differently: Take 5 mg by mouth at bedtime as needed for sleep.)  ? ?No facility-administered encounter medications on file as of 02/09/2022.  ? ? ?Allergies (verified) ?Lisinopril and Other  ? ?History: ?Past Medical History:  ?Diagnosis Date  ? Cataract   ? bil cataracts removed  ? Depression   ? Diarrhea   ? Hypertension   ? IBS (irritable bowel syndrome)   ? diarrhea  ? Insomnia   ? Migraine   ? Osteoporosis   ? [employer] 06/11/2020  ? Plantar fasciitis   ? ?Past Surgical History:  ?Procedure Laterality Date  ? ABDOMINAL HYSTERECTOMY    ? DUB; ovaries intact.  ? BREAST CYST ASPIRATION Bilateral   ? BUNIONECTOMY  2007  ? Dr. 2008  ? EYE SURGERY N/A   ? [employer] 06/11/2020  ? VESICOVAGINAL FISTULA CLOSURE W/ TAH  1985  ? Dr. 08/11/2020  ? ?Family History  ?Problem Relation Age of Onset  ?  CAD Father 79  ? Cancer Father   ?     bladder cancer  ? Diabetes Father   ? Heart disease Father   ? Hyperlipidemia Father   ? Hypertension Father   ? Valvular heart disease Mother   ?     MVP  ? Heart disease Mother   ? Hyperlipidemia Mother   ? Hypertension Mother   ? Hypertension Brother   ? Hypertension Other   ?     mother, father, brother  ? Breast cancer Maternal Aunt   ? Colon cancer Neg Hx   ? Esophageal cancer Neg Hx   ? Rectal cancer Neg Hx   ? Stomach cancer Neg Hx   ? ?Social History  ? ?Socioeconomic History  ? Marital status: Widowed  ?  Spouse name: Not on file  ? Number of children: 2  ? Years of education: Not on  file  ? Highest education level: Not on file  ?Occupational History  ? Not on file  ?Tobacco Use  ? Smoking status: Never  ? Smokeless tobacco: Never  ?Vaping Use  ? Vaping Use: Never used  ?Substance and Sexual Activity  ? Alcohol use: No  ? Drug use: Never  ? Sexual activity: Not on file  ?Other Topics Concern  ? Not on file  ?Social History Narrative  ?   ?   ? ?Social Determinants of Health  ? ?Financial Resource Strain: Low Risk   ? Difficulty of Paying Living Expenses: Not hard at all  ?Food Insecurity: No Food Insecurity  ? Worried About Programme researcher, broadcasting/film/video in the Last Year: Never true  ? Ran Out of Food in the Last Year: Never true  ?Transportation Needs: No Transportation Needs  ? Lack of Transportation (Medical): No  ? Lack of Transportation (Non-Medical): No  ?Physical Activity: Insufficiently Active  ? Days of Exercise per Week: 3 days  ? Minutes of Exercise per Session: 30 min  ?Stress: No Stress Concern Present  ? Feeling of Stress : Not at all  ?Social Connections: Moderately Isolated  ? Frequency of Communication with Friends and Family: Twice a week  ? Frequency of Social Gatherings with Friends and Family: Twice a week  ? Attends Religious Services: Never  ? Active Member of Clubs or Organizations: Yes  ? Attends Banker Meetings: 1 to 4 times per year  ? Marital Status: Widowed  ? ? ?Tobacco Counseling ?Counseling given: Not Answered ? ? ?Clinical Intake: ? ?Pre-visit preparation completed: Yes ? ?Pain : No/denies pain ? ?  ? ?Nutritional Risks: None ?Diabetes: No ? ?How often do you need to have someone help you when you read instructions, pamphlets, or other written materials from your doctor or pharmacy?: 1 - Never ?What is the last grade level you completed in school?: High School ? ?Diabetic?no  ? ?Interpreter Needed?: No ? ?Information entered by :: L.Kamal Jurgens,LPN ? ? ?Activities of Daily Living ? ?  02/09/2022  ?  2:08 PM 02/19/2021  ?  2:45 PM  ?In your present state of health, do  you have any difficulty performing the following activities:  ?Hearing? 0 0  ?Vision? 0 1  ?Difficulty concentrating or making decisions? 0 1  ?Walking or climbing stairs? 0 0  ?Dressing or bathing? 0 0  ?Doing errands, shopping? 0 0  ?Preparing Food and eating ? N   ?Using the Toilet? N   ?In the past six months, have you accidently leaked urine? N   ?  Do you have problems with loss of bowel control? N   ?Managing your Medications? N   ?Managing your Finances? N   ?Housekeeping or managing your Housekeeping? N   ? ? ?Patient Care Team: ?Etta GrandchildJones, Thomas L, MD as PCP - General (Internal Medicine) ? ?Indicate any recent Medical Services you may have received from other than [facility] providers in the past year (date may be approximate). ? ?   ?Assessment:  ? This is a routine wellness examination for Kelli Contreras. ? ?Hearing/Vision screen ?Vision Screening - Comments:: Annual eye exams wear glasses  ? ?Dietary issues and exercise activities discussed: ?Current Exercise Habits: Home exercise routine, Type of exercise: walking, Time (Minutes): 30, Frequency (Times/Week): 3, Weekly Exercise (Minutes/Week): 90, Intensity: Mild, Exercise limited by: None identified ? ? Goals Addressed   ?None ?  ? ?Depression Screen ? ?  02/09/2022  ?  2:08 PM 09/24/2021  ? 10:33 AM 08/13/2020  ? 10:36 AM 06/21/2020  ? 11:40 AM 06/14/2020  ? 11:38 AM 06/11/2020  ?  8:14 AM 02/27/2020  ? 12:03 PM  ?PHQ 2/9 Scores  ?PHQ - 2 Score 0 0 0 0 0 0 0  ?  ?Fall Risk ? ?  02/09/2022  ?  2:08 PM 09/24/2021  ? 10:33 AM 08/13/2020  ? 10:35 AM 06/21/2020  ? 11:40 AM 06/14/2020  ? 11:38 AM  ?Fall Risk   ?Falls in the past year? 0 1 0 0 1  ?Number falls in past yr: 0 1 0 0 0  ?Injury with Fall? 0 0 0 0 0  ?Risk for fall due to :  History of fall(s) No Fall Risks  Impaired mobility  ?Risk for fall due to: Comment     got light headed. Did not fall on the ground but fell on recliner  ?Follow up Falls evaluation completed Falls evaluation completed Falls evaluation completed Falls  evaluation completed Falls evaluation completed  ? ? ?FALL RISK PREVENTION PERTAINING TO THE HOME: ? ?Any stairs in or around the home? No  ?If so, are there any without handrails? Yes  ?Home free of loose throw

## 2022-02-11 DIAGNOSIS — Z20828 Contact with and (suspected) exposure to other viral communicable diseases: Secondary | ICD-10-CM | POA: Diagnosis not present

## 2022-02-11 DIAGNOSIS — R0981 Nasal congestion: Secondary | ICD-10-CM | POA: Diagnosis not present

## 2022-02-11 DIAGNOSIS — M791 Myalgia, unspecified site: Secondary | ICD-10-CM | POA: Diagnosis not present

## 2022-02-11 DIAGNOSIS — J019 Acute sinusitis, unspecified: Secondary | ICD-10-CM | POA: Diagnosis not present

## 2022-04-08 DIAGNOSIS — R519 Headache, unspecified: Secondary | ICD-10-CM | POA: Diagnosis not present

## 2022-04-08 DIAGNOSIS — J209 Acute bronchitis, unspecified: Secondary | ICD-10-CM | POA: Diagnosis not present

## 2022-04-08 DIAGNOSIS — J0100 Acute maxillary sinusitis, unspecified: Secondary | ICD-10-CM | POA: Diagnosis not present

## 2022-05-08 ENCOUNTER — Telehealth: Payer: Self-pay | Admitting: Internal Medicine

## 2022-05-08 ENCOUNTER — Other Ambulatory Visit: Payer: Self-pay | Admitting: Internal Medicine

## 2022-05-08 DIAGNOSIS — I10 Essential (primary) hypertension: Secondary | ICD-10-CM

## 2022-05-08 DIAGNOSIS — I119 Hypertensive heart disease without heart failure: Secondary | ICD-10-CM

## 2022-05-08 DIAGNOSIS — G43709 Chronic migraine without aura, not intractable, without status migrainosus: Secondary | ICD-10-CM

## 2022-05-08 MED ORDER — DULOXETINE HCL 30 MG PO CPEP: 30.0000 mg | ORAL_CAPSULE | Freq: Every day | ORAL | 0 refills | Status: DC

## 2022-05-08 MED ORDER — OLMESARTAN MEDOXOMIL 20 MG PO TABS: 20.0000 mg | ORAL_TABLET | Freq: Every day | ORAL | 0 refills | Status: DC

## 2022-05-08 MED ORDER — IMIPRAMINE HCL 50 MG PO TABS: 100.0000 mg | ORAL_TABLET | Freq: Every day | ORAL | 0 refills | Status: DC

## 2022-05-08 MED ORDER — METOPROLOL SUCCINATE ER 25 MG PO TB24: 25.0000 mg | ORAL_TABLET | Freq: Every day | ORAL | 0 refills | Status: DC

## 2022-05-08 NOTE — Telephone Encounter (Signed)
Caller & Relationship to patient: Kelli Contreras  Call back number: [phone]  Date of last office visit: 09/24/21  Date of next office visit:   Medication(s) to be refilled: DULoxetine (CYMBALTA) 30 MG capsule  imipramine (TOFRANIL) 50 MG tablet  metoprolol succinate (TOPROL-XL) 25 MG 24 hr tablet  olmesartan (BENICAR) 20 MG tablet   Preferred Pharmacy:  [facility] - [place], [place] - [address] Phone:  [phone]  Fax:  [phone]

## 2022-05-08 NOTE — Telephone Encounter (Signed)
Called pt back and scheduled appointment for 06/03/22. Advised rx refills are at the discretion of provider.    Fyi

## 2022-05-31 ENCOUNTER — Other Ambulatory Visit: Payer: Self-pay | Admitting: Internal Medicine

## 2022-05-31 DIAGNOSIS — G43709 Chronic migraine without aura, not intractable, without status migrainosus: Secondary | ICD-10-CM

## 2022-05-31 DIAGNOSIS — I10 Essential (primary) hypertension: Secondary | ICD-10-CM

## 2022-05-31 DIAGNOSIS — I119 Hypertensive heart disease without heart failure: Secondary | ICD-10-CM

## 2022-06-03 ENCOUNTER — Ambulatory Visit (INDEPENDENT_AMBULATORY_CARE_PROVIDER_SITE_OTHER): Payer: Medicare PPO

## 2022-06-03 ENCOUNTER — Ambulatory Visit: Payer: Medicare PPO | Admitting: Internal Medicine

## 2022-06-03 ENCOUNTER — Encounter: Payer: Self-pay | Admitting: Internal Medicine

## 2022-06-03 VITALS — BP 156/104 | HR 75 | Temp 98.1°F | Resp 16 | Ht 62.0 in | Wt 176.0 lb

## 2022-06-03 DIAGNOSIS — E785 Hyperlipidemia, unspecified: Secondary | ICD-10-CM

## 2022-06-03 DIAGNOSIS — M79662 Pain in left lower leg: Secondary | ICD-10-CM | POA: Diagnosis not present

## 2022-06-03 DIAGNOSIS — Z0001 Encounter for general adult medical examination with abnormal findings: Secondary | ICD-10-CM

## 2022-06-03 DIAGNOSIS — N1831 Chronic kidney disease, stage 3a: Secondary | ICD-10-CM

## 2022-06-03 DIAGNOSIS — S8782XA Crushing injury of left lower leg, initial encounter: Secondary | ICD-10-CM | POA: Diagnosis not present

## 2022-06-03 DIAGNOSIS — D692 Other nonthrombocytopenic purpura: Secondary | ICD-10-CM | POA: Diagnosis not present

## 2022-06-03 DIAGNOSIS — G43709 Chronic migraine without aura, not intractable, without status migrainosus: Secondary | ICD-10-CM | POA: Diagnosis not present

## 2022-06-03 DIAGNOSIS — I5189 Other ill-defined heart diseases: Secondary | ICD-10-CM

## 2022-06-03 DIAGNOSIS — I119 Hypertensive heart disease without heart failure: Secondary | ICD-10-CM | POA: Diagnosis not present

## 2022-06-03 DIAGNOSIS — E269 Hyperaldosteronism, unspecified: Secondary | ICD-10-CM

## 2022-06-03 DIAGNOSIS — I10 Essential (primary) hypertension: Secondary | ICD-10-CM

## 2022-06-03 DIAGNOSIS — K219 Gastro-esophageal reflux disease without esophagitis: Secondary | ICD-10-CM

## 2022-06-03 DIAGNOSIS — M7989 Other specified soft tissue disorders: Secondary | ICD-10-CM | POA: Diagnosis not present

## 2022-06-03 DIAGNOSIS — Z1231 Encounter for screening mammogram for malignant neoplasm of breast: Secondary | ICD-10-CM

## 2022-06-03 HISTORY — DX: Other nonthrombocytopenic purpura: D69.2

## 2022-06-03 LAB — HEPATIC FUNCTION PANEL
ALT: 14 U/L (ref 0–35)
AST: 20 U/L (ref 0–37)
Albumin: 3.8 g/dL (ref 3.5–5.2)
Alkaline Phosphatase: 97 U/L (ref 39–117)
Bilirubin, Direct: 0.1 mg/dL (ref 0.0–0.3)
Total Bilirubin: 0.5 mg/dL (ref 0.2–1.2)
Total Protein: 6.6 g/dL (ref 6.0–8.3)

## 2022-06-03 LAB — LIPID PANEL
Cholesterol: 122 mg/dL (ref 0–200)
HDL: 48.7 mg/dL (ref >39.00)
LDL Cholesterol: 63 mg/dL (ref 0–99)
NonHDL: 73.02
Total CHOL/HDL Ratio: 2
Triglycerides: 52 mg/dL (ref 0.0–149.0)
VLDL: 10.4 mg/dL (ref 0.0–40.0)

## 2022-06-03 LAB — BASIC METABOLIC PANEL
BUN: 16 mg/dL (ref 6–23)
CO2: 30 mEq/L (ref 19–32)
Calcium: 9.1 mg/dL (ref 8.4–10.5)
Chloride: 108 mEq/L (ref 96–112)
Creatinine, Ser: 0.9 mg/dL (ref 0.40–1.20)
GFR: 65.64 mL/min (ref >60.00)
Glucose, Bld: 89 mg/dL (ref 70–99)
Potassium: 4.3 mEq/L (ref 3.5–5.1)
Sodium: 139 mEq/L (ref 135–145)

## 2022-06-03 LAB — PROTIME-INR
INR: 1 ratio (ref 0.8–1.0)
Prothrombin Time: 11 s (ref 9.6–13.1)

## 2022-06-03 LAB — TSH: TSH: 1.23 u[IU]/mL (ref 0.35–5.50)

## 2022-06-03 LAB — BRAIN NATRIURETIC PEPTIDE: Pro B Natriuretic peptide (BNP): 51 pg/mL (ref 0.0–100.0)

## 2022-06-03 LAB — TROPONIN I (HIGH SENSITIVITY): High Sens Troponin I: 3 ng/L (ref 2–17)

## 2022-06-03 LAB — APTT: aPTT: 38.6 s — ABNORMAL HIGH (ref 25.4–36.8)

## 2022-06-03 MED ORDER — IMIPRAMINE HCL 50 MG PO TABS: 100.0000 mg | ORAL_TABLET | Freq: Every day | ORAL | 1 refills | Status: DC

## 2022-06-03 MED ORDER — DULOXETINE HCL 30 MG PO CPEP: 30.0000 mg | ORAL_CAPSULE | Freq: Every day | ORAL | 1 refills | Status: DC

## 2022-06-03 MED ORDER — METOPROLOL SUCCINATE ER 25 MG PO TB24: ORAL_TABLET | ORAL | 1 refills | Status: DC

## 2022-06-03 MED ORDER — PANTOPRAZOLE SODIUM 40 MG PO TBEC: 40.0000 mg | DELAYED_RELEASE_TABLET | Freq: Every day | ORAL | 1 refills | Status: DC

## 2022-06-03 NOTE — Progress Notes (Signed)
Subjective:  Patient ID: Kelli Contreras, female    DOB: 09/03/1951  Age: 71 y.o. MRN: [id number]  CC: Annual Exam, Hypertension, and Hyperlipidemia   HPI Kelli Contreras presents for a CPX and f/up-  She reports that her LLE was hit by a car door 5 days ago - she has pain, bruising, swelling in the area but can bear weight on the left leg.  No chest pain, shortness of breath, diaphoresis, palpitations, headache, dizziness, or lightheadedness.  Outpatient Medications Prior to Visit  Medication Sig Dispense Refill   aspirin 81 MG tablet Take 81 mg by mouth at bedtime.      atorvastatin (LIPITOR) 20 MG tablet TAKE 1 TABLET EVERY DAY 90 tablet 1   SUMAtriptan (IMITREX) 100 MG tablet Take 1/2 tab (50mg ) at onset of migraine. May take other half tab in 2 hours if headache persists or recurs. 9 tablet 4   topiramate (TOPAMAX) 50 MG tablet Take 1 tablet (50 mg total) by mouth 2 (two) times daily. 180 tablet 0   zolpidem (AMBIEN) 5 MG tablet Take 1 tablet (5 mg total) by mouth at bedtime as needed. (Patient taking differently: Take 5 mg by mouth at bedtime as needed for sleep.) 60 tablet 0   DULoxetine (CYMBALTA) 30 MG capsule TAKE 1 CAPSULE BY MOUTH EVERY DAY 90 capsule 1   imipramine (TOFRANIL) 50 MG tablet TAKE 2 TABLETS BY MOUTH AT BEDTIME. 180 tablet 1   metoprolol succinate (TOPROL-XL) 25 MG 24 hr tablet TAKE 1 TABLET BY MOUTH EVERYDAY AT BEDTIME 30 tablet 0   olmesartan (BENICAR) 20 MG tablet Take 1 tablet (20 mg total) by mouth daily. 30 tablet 0   pantoprazole (PROTONIX) 40 MG tablet TAKE 1 TABLET EVERY DAY (NEED OFFICE VISIT FOR FURTHER REFILLS) 90 tablet 1   No facility-administered medications prior to visit.    ROS Review of Systems  Constitutional:  Negative for chills, diaphoresis, fatigue and fever.  HENT: Negative.  Negative for nosebleeds.   Eyes: Negative.   Respiratory:  Negative for cough, chest tightness, shortness of breath and wheezing.   Cardiovascular:   Positive for leg swelling. Negative for chest pain and palpitations.  Gastrointestinal: Negative.  Negative for abdominal pain, constipation, diarrhea, nausea and vomiting.  Endocrine: Negative.   Genitourinary: Negative.  Negative for difficulty urinating, dysuria and hematuria.  Musculoskeletal:  Positive for arthralgias. Negative for myalgias.  Skin: Negative.   Neurological: Negative.  Negative for dizziness, weakness, light-headedness and headaches.  Hematological:  Negative for adenopathy. Bruises/bleeds easily.  Psychiatric/Behavioral: Negative.      Objective:  BP (!) 156/104 (BP Location: Left Arm, Patient Position: Sitting, Cuff Size: Large)   Pulse 75   Temp 98.1 F (36.7 C) (Oral)   Resp 16   Ht 5\' 2"  (1.575 m)   Wt 176 lb (79.8 kg)   SpO2 95%   BMI 32.19 kg/m   BP Readings from Last 3 Encounters:  06/03/22 (!) 156/104  09/24/21 138/84  07/08/21 (!) 146/98    Wt Readings from Last 3 Encounters:  06/03/22 176 lb (79.8 kg)  09/24/21 176 lb (79.8 kg)  07/08/21 172 lb (78 kg)    Physical Exam HENT:     Nose: Nose normal.     Mouth/Throat:     Mouth: Mucous membranes are moist.  Eyes:     General: No scleral icterus.    Conjunctiva/sclera: Conjunctivae normal.  Cardiovascular:     Rate and Rhythm: Normal rate and regular  rhythm.     Heart sounds: Normal heart sounds, S1 normal and S2 normal. No murmur heard.    No friction rub. No gallop.     Comments: EKG- NSR, 68 bpm No LVH, Q waves, or ST/T wave changes Normal EKG Pulmonary:     Effort: Pulmonary effort is normal.     Breath sounds: No stridor. No wheezing, rhonchi or rales.  Abdominal:     General: Abdomen is flat.     Palpations: There is no mass.     Tenderness: There is no abdominal tenderness. There is no guarding.     Hernia: No hernia is present.  Musculoskeletal:        General: Swelling and tenderness present. No deformity.     Cervical back: Neck supple.     Right lower leg: No edema.      Left lower leg: No edema.  Lymphadenopathy:     Cervical: No cervical adenopathy.  Skin:    General: Skin is warm and dry.     Findings: Bruising present. No rash.  Neurological:     General: No focal deficit present.     Mental Status: She is alert. Mental status is at baseline.  Psychiatric:        Mood and Affect: Mood normal.     Lab Results  Component Value Date   WBC 6.6 06/03/2022   HGB 13.5 06/03/2022   HCT 40.7 06/03/2022   PLT 152.0 06/03/2022   GLUCOSE 89 06/03/2022   CHOL 122 06/03/2022   TRIG 52.0 06/03/2022   HDL 48.70 06/03/2022   LDLCALC 63 06/03/2022   ALT 14 06/03/2022   AST 20 06/03/2022   NA 139 06/03/2022   K 4.3 06/03/2022   CL 108 06/03/2022   CREATININE 0.90 06/03/2022   BUN 16 06/03/2022   CO2 30 06/03/2022   TSH 1.23 06/03/2022   INR 1.0 06/03/2022   HGBA1C 5.4 02/18/2015    CT Chest Wo Contrast  Result Date: 10/28/2021 CLINICAL DATA:  Follow-up of lung nodule in a 71 year old female. EXAM: CT CHEST WITHOUT CONTRAST TECHNIQUE: Multidetector CT imaging of the chest was performed following the standard protocol without IV contrast. COMPARISON:  Mar 25, 2021. FINDINGS: Cardiovascular: Aortic atherosclerosis. No aneurysmal dilation. No pericardial effusion. Normal heart size. Normal caliber of the central pulmonary vessels. Scattered coronary artery calcifications. Limited assessment of cardiovascular structures given lack of intravenous contrast. Mediastinum/Nodes: No thoracic inlet, axillary, mediastinal or hilar adenopathy. Esophagus grossly normal. Lungs/Pleura: LEFT lower lobe pulmonary nodule (image 73/3) 8 x 7 mm, stable accounting for respiratory motion seen on the prior study in this location. Upper Abdomen: Incidental imaging of upper abdominal contents without acute process. Splenic artery aneurysm measuring 2 x 1.7 cm. No comparison imaging is available. Area not well assessed without contrast. Musculoskeletal: No acute musculoskeletal  process. Osteopenia. Spinal degenerative changes. IMPRESSION: LEFT lower lobe pulmonary nodule measuring 8 x 7 mm, stable accounting for respiratory motion seen on the prior study in this location. Suggest future CT at 18-24 months from the previous scan of Mar 25, 2021 to document continued stability. Splenic artery aneurysm measuring 2 x 1.7 cm. No comparison imaging is available. Referral to Interventional Radiology is suggested based on current guidelines for splenic artery aneurysms equal to or greater than 2 cm. Aortic atherosclerosis. Electronically Signed   By: Donzetta Kohut M.D.   On: 10/28/2021 17:56    Assessment & Plan:   Angelika was seen today for annual exam, hypertension and  hyperlipidemia.  Diagnoses and all orders for this visit:  Senile purpura (HCC)- Labs do not reveal any significant coagulopathy. -     Basic metabolic panel; Future -     Protime-INR; Future -     APTT; Future -     APTT -     Protime-INR -     Basic metabolic panel  LVH (left ventricular hypertrophy) due to hypertensive disease, without heart failure- There is no longer LVH on her EKG. -     metoprolol succinate (TOPROL-XL) 25 MG 24 hr tablet; TAKE 1 TABLET BY MOUTH EVERYDAY AT BEDTIME  Primary hypertension- Her blood pressure is not adequately well controlled.  Will increase the dose of olmesartan.  Will treat for hyperaldosteronism.  Will check labs to screen for secondary causes or endorgan damage. -     metoprolol succinate (TOPROL-XL) 25 MG 24 hr tablet; TAKE 1 TABLET BY MOUTH EVERYDAY AT BEDTIME -     Aldosterone + renin activity w/ ratio; Future -     TSH; Future -     Urinalysis, Routine w reflex microscopic; Future -     Hepatic function panel; Future -     EKG 12-Lead -     Hepatic function panel -     Urinalysis, Routine w reflex microscopic -     TSH -     Aldosterone + renin activity w/ ratio -     olmesartan (BENICAR) 40 MG tablet; Take 1 tablet (40 mg total) by mouth daily. -      spironolactone (ALDACTONE) 25 MG tablet; Take 1 tablet (25 mg total) by mouth daily.  Gastroesophageal reflux disease without esophagitis -     pantoprazole (PROTONIX) 40 MG tablet; Take 1 tablet (40 mg total) by mouth daily. -     CBC with Differential/Platelet; Future -     CBC with Differential/Platelet  Chronic migraine without aura without status migrainosus, not intractable -     DULoxetine (CYMBALTA) 30 MG capsule; Take 1 capsule (30 mg total) by mouth daily. -     imipramine (TOFRANIL) 50 MG tablet; Take 2 tablets (100 mg total) by mouth at bedtime.  Stage 3a chronic kidney disease (HCC)- Her renal function has improved. -     Urinalysis, Routine w reflex microscopic; Future -     Basic metabolic panel; Future -     Basic metabolic panel -     Urinalysis, Routine w reflex microscopic  Grade I diastolic dysfunction- She has a normal volume status.  Will try to get better control of her blood pressure. -     Brain natriuretic peptide; Future -     Troponin I (High Sensitivity); Future -     Troponin I (High Sensitivity) -     Brain natriuretic peptide  Hyperlipidemia with target LDL less than 130- LDL goal achieved. Doing well on the statin  -     Lipid panel; Future -     TSH; Future -     Hepatic function panel; Future -     Hepatic function panel -     TSH -     Lipid panel  Crushing injury of left lower leg, initial encounter- Plain films are negative for fracture. -     DG Tibia/Fibula Left; Future  Encounter for general adult medical examination with abnormal findings- Exam completed, labs reviewed, she refused a pneumonia vaccine and shingles vaccine, cancer screenings addressed, patient education was given.  Hyperaldosteronism (HCC) -  spironolactone (ALDACTONE) 25 MG tablet; Take 1 tablet (25 mg total) by mouth daily.   I have discontinued Amandalynn F. Delatte's olmesartan. I have also changed her pantoprazole, DULoxetine, and imipramine. Additionally, I am  having her start on olmesartan and spironolactone. Lastly, I am having her maintain her aspirin, zolpidem, SUMAtriptan, topiramate, atorvastatin, and metoprolol succinate.  Meds ordered this encounter  Medications   pantoprazole (PROTONIX) 40 MG tablet    Sig: Take 1 tablet (40 mg total) by mouth daily.    Dispense:  100 tablet    Refill:  1   DULoxetine (CYMBALTA) 30 MG capsule    Sig: Take 1 capsule (30 mg total) by mouth daily.    Dispense:  100 capsule    Refill:  1   imipramine (TOFRANIL) 50 MG tablet    Sig: Take 2 tablets (100 mg total) by mouth at bedtime.    Dispense:  200 tablet    Refill:  1   metoprolol succinate (TOPROL-XL) 25 MG 24 hr tablet    Sig: TAKE 1 TABLET BY MOUTH EVERYDAY AT BEDTIME    Dispense:  100 tablet    Refill:  1   olmesartan (BENICAR) 40 MG tablet    Sig: Take 1 tablet (40 mg total) by mouth daily.    Dispense:  100 tablet    Refill:  0   spironolactone (ALDACTONE) 25 MG tablet    Sig: Take 1 tablet (25 mg total) by mouth daily.    Dispense:  90 tablet    Refill:  0   In addition to time spent on CPE, I spent 45 minutes in preparing to see the patient by review of recent labs, and imaging, obtaining and reviewing separately obtained history, communicating with the patient, ordering medications, an EKG, and an Xray,  and documenting clinical information in the EHR including the differential Dx, treatment, and any further evaluation and management of multiple complex medical issues.     Follow-up: Return in about 6 weeks (around 07/15/2022).  Scarlette Calico, MD

## 2022-06-03 NOTE — Patient Instructions (Signed)

## 2022-06-04 LAB — CBC WITH DIFFERENTIAL/PLATELET
Basophils Absolute: 0 10*3/uL (ref 0.0–0.1)
Basophils Relative: 0.7 % (ref 0.0–3.0)
Eosinophils Absolute: 0 10*3/uL (ref 0.0–0.7)
Eosinophils Relative: 0 % (ref 0.0–5.0)
HCT: 40.7 % (ref 36.0–46.0)
Hemoglobin: 13.5 g/dL (ref 12.0–15.0)
Lymphocytes Relative: 30.7 % (ref 12.0–46.0)
Lymphs Abs: 2 10*3/uL (ref 0.7–4.0)
MCHC: 33.3 g/dL (ref 30.0–36.0)
MCV: 90.4 fl (ref 78.0–100.0)
Monocytes Absolute: 0.6 10*3/uL (ref 0.1–1.0)
Monocytes Relative: 8.9 % (ref 3.0–12.0)
Neutro Abs: 4 10*3/uL (ref 1.4–7.7)
Neutrophils Relative %: 59.7 % (ref 43.0–77.0)
Platelets: 152 10*3/uL (ref 150.0–400.0)
RBC: 4.5 Mil/uL (ref 3.87–5.11)
RDW: 14 % (ref 11.5–15.5)
WBC: 6.6 10*3/uL (ref 4.0–10.5)

## 2022-06-04 LAB — URINALYSIS, ROUTINE W REFLEX MICROSCOPIC
Bilirubin Urine: NEGATIVE
Ketones, ur: NEGATIVE
Nitrite: NEGATIVE
RBC / HPF: NONE SEEN (ref 0–?)
Specific Gravity, Urine: 1.02 (ref 1.000–1.030)
Total Protein, Urine: NEGATIVE
Urine Glucose: NEGATIVE
Urobilinogen, UA: 0.2 (ref 0.0–1.0)
pH: 6 (ref 5.0–8.0)

## 2022-06-04 MED ORDER — OLMESARTAN MEDOXOMIL 40 MG PO TABS: 40.0000 mg | ORAL_TABLET | Freq: Every day | ORAL | 0 refills | Status: DC

## 2022-06-10 DIAGNOSIS — E269 Hyperaldosteronism, unspecified: Secondary | ICD-10-CM

## 2022-06-10 HISTORY — DX: Hyperaldosteronism, unspecified: E26.9

## 2022-06-10 MED ORDER — SPIRONOLACTONE 25 MG PO TABS: 25.0000 mg | ORAL_TABLET | Freq: Every day | ORAL | 0 refills | Status: DC

## 2022-06-11 LAB — ALDOSTERONE + RENIN ACTIVITY W/ RATIO
ALDO / PRA Ratio: 60 Ratio — ABNORMAL HIGH (ref 0.9–28.9)
Aldosterone: 9 ng/dL
Renin Activity: 0.15 ng/mL/h — ABNORMAL LOW (ref 0.25–5.82)

## 2022-06-26 ENCOUNTER — Ambulatory Visit
Admission: RE | Admit: 2022-06-26 | Discharge: 2022-06-26 | Disposition: A | Discharge status: Home / Self Care | Payer: Medicare PPO | Source: Ambulatory Visit | Attending: Internal Medicine | Admitting: Internal Medicine

## 2022-06-26 DIAGNOSIS — Z1231 Encounter for screening mammogram for malignant neoplasm of breast: Secondary | ICD-10-CM | POA: Diagnosis not present

## 2022-06-27 ENCOUNTER — Other Ambulatory Visit: Payer: Self-pay | Admitting: Internal Medicine

## 2022-06-27 DIAGNOSIS — I10 Essential (primary) hypertension: Secondary | ICD-10-CM

## 2022-06-27 DIAGNOSIS — I119 Hypertensive heart disease without heart failure: Secondary | ICD-10-CM

## 2022-07-09 DIAGNOSIS — J0100 Acute maxillary sinusitis, unspecified: Secondary | ICD-10-CM | POA: Diagnosis not present

## 2022-07-09 DIAGNOSIS — J209 Acute bronchitis, unspecified: Secondary | ICD-10-CM | POA: Diagnosis not present

## 2022-08-13 ENCOUNTER — Other Ambulatory Visit: Payer: Self-pay | Admitting: Internal Medicine

## 2022-08-13 DIAGNOSIS — E785 Hyperlipidemia, unspecified: Secondary | ICD-10-CM

## 2022-08-13 DIAGNOSIS — I10 Essential (primary) hypertension: Secondary | ICD-10-CM

## 2022-08-13 DIAGNOSIS — E269 Hyperaldosteronism, unspecified: Secondary | ICD-10-CM

## 2022-08-13 DIAGNOSIS — G43709 Chronic migraine without aura, not intractable, without status migrainosus: Secondary | ICD-10-CM

## 2022-08-17 ENCOUNTER — Encounter: Payer: Self-pay | Admitting: Internal Medicine

## 2022-08-17 ENCOUNTER — Ambulatory Visit: Payer: Medicare PPO | Admitting: Internal Medicine

## 2022-08-17 ENCOUNTER — Other Ambulatory Visit (INDEPENDENT_AMBULATORY_CARE_PROVIDER_SITE_OTHER): Payer: Medicare PPO

## 2022-08-17 VITALS — BP 144/104 | HR 94 | Temp 97.8°F | Resp 16 | Ht 62.0 in | Wt 175.0 lb

## 2022-08-17 DIAGNOSIS — I728 Aneurysm of other specified arteries: Secondary | ICD-10-CM

## 2022-08-17 DIAGNOSIS — R918 Other nonspecific abnormal finding of lung field: Secondary | ICD-10-CM

## 2022-08-17 DIAGNOSIS — R0789 Other chest pain: Secondary | ICD-10-CM

## 2022-08-17 DIAGNOSIS — I119 Hypertensive heart disease without heart failure: Secondary | ICD-10-CM | POA: Diagnosis not present

## 2022-08-17 DIAGNOSIS — R911 Solitary pulmonary nodule: Secondary | ICD-10-CM

## 2022-08-17 DIAGNOSIS — I10 Essential (primary) hypertension: Secondary | ICD-10-CM

## 2022-08-17 LAB — TROPONIN I (HIGH SENSITIVITY): High Sens Troponin I: 3 ng/L (ref 2–17)

## 2022-08-17 LAB — D-DIMER, QUANTITATIVE: D-Dimer, Quant: 0.33 mcg/mL FEU (ref <0.50)

## 2022-08-17 LAB — BRAIN NATRIURETIC PEPTIDE: Pro B Natriuretic peptide (BNP): 24 pg/mL (ref 0.0–100.0)

## 2022-08-17 NOTE — Progress Notes (Signed)
Subjective:  Patient ID: Kelli Contreras, female    DOB: 06-27-1951  Age: 71 y.o. MRN: [id number]  CC: Hypertension   HPI Kelli Contreras presents for f/up -  She admits that she has not been exercising and complains that her blood pressure is not well controlled and she has had a few headaches recently.  She complains of weight gain and for 2 days she has had chest pressure and dyspnea on exertion.  She denies diaphoresis, edema, or palpitations.  She tells me she is compliant with her current antihypertensives.  Outpatient Medications Prior to Visit  Medication Sig Dispense Refill   aspirin 81 MG tablet Take 81 mg by mouth at bedtime.      atorvastatin (LIPITOR) 20 MG tablet TAKE 1 TABLET EVERY DAY 90 tablet 1   DULoxetine (CYMBALTA) 30 MG capsule Take 1 capsule (30 mg total) by mouth daily. 100 capsule 1   imipramine (TOFRANIL) 50 MG tablet Take 2 tablets (100 mg total) by mouth at bedtime. 200 tablet 1   metoprolol succinate (TOPROL-XL) 25 MG 24 hr tablet TAKE 1 TABLET BY MOUTH EVERYDAY AT BEDTIME 90 tablet 0   olmesartan (BENICAR) 40 MG tablet TAKE 1 TABLET EVERY DAY 90 tablet 0   pantoprazole (PROTONIX) 40 MG tablet Take 1 tablet (40 mg total) by mouth daily. 100 tablet 1   spironolactone (ALDACTONE) 25 MG tablet TAKE 1 TABLET EVERY DAY 90 tablet 0   SUMAtriptan (IMITREX) 100 MG tablet Take 1/2 tab (50mg ) at onset of migraine. May take other half tab in 2 hours if headache persists or recurs. 9 tablet 4   topiramate (TOPAMAX) 50 MG tablet TAKE 1 TABLET TWICE DAILY 180 tablet 0   zolpidem (AMBIEN) 5 MG tablet Take 1 tablet (5 mg total) by mouth at bedtime as needed. (Patient taking differently: Take 5 mg by mouth at bedtime as needed for sleep.) 60 tablet 0   No facility-administered medications prior to visit.    ROS Review of Systems  Constitutional: Negative.  Negative for diaphoresis and fatigue.  HENT: Negative.    Eyes: Negative.   Respiratory:  Positive for shortness  of breath. Negative for chest tightness and wheezing.   Cardiovascular:  Positive for chest pain. Negative for palpitations and leg swelling.  Gastrointestinal:  Negative for abdominal pain, constipation, diarrhea and nausea.  Endocrine: Negative.   Genitourinary: Negative.  Negative for difficulty urinating and dysuria.  Musculoskeletal: Negative.   Skin: Negative.   Neurological:  Positive for headaches. Negative for dizziness, weakness and light-headedness.  Hematological:  Negative for adenopathy. Does not bruise/bleed easily.  Psychiatric/Behavioral: Negative.      Objective:  BP (!) 144/104 (BP Location: Left Arm, Patient Position: Sitting, Cuff Size: Large) Comment: BP (R) 144/104 (L) 142/102  Pulse 94   Temp 97.8 F (36.6 C) (Oral)   Resp 16   Ht 5\' 2"  (1.575 m)   Wt 175 lb (79.4 kg)   SpO2 98%   BMI 32.01 kg/m   BP Readings from Last 3 Encounters:  08/17/22 (!) 144/104  06/03/22 (!) 156/104  09/24/21 138/84    Wt Readings from Last 3 Encounters:  08/17/22 175 lb (79.4 kg)  06/03/22 176 lb (79.8 kg)  09/24/21 176 lb (79.8 kg)    Physical Exam Vitals reviewed.  HENT:     Mouth/Throat:     Mouth: Mucous membranes are moist.  Eyes:     General: No scleral icterus.    Conjunctiva/sclera: Conjunctivae normal.  Cardiovascular:     Rate and Rhythm: Normal rate and regular rhythm.     Pulses: Normal pulses.     Heart sounds: No murmur heard.    No gallop.     Comments: EKG- NSR, 81 bpm LAD  Anterior infarct pattern is not new TWI in III is old Pulmonary:     Effort: Pulmonary effort is normal.     Breath sounds: No stridor. No wheezing, rhonchi or rales.  Abdominal:     General: Abdomen is flat.     Palpations: There is no mass.     Tenderness: There is no abdominal tenderness. There is no guarding.     Hernia: No hernia is present.  Musculoskeletal:        General: Normal range of motion.     Cervical back: Neck supple.     Right lower leg: No edema.      Left lower leg: No edema.  Skin:    General: Skin is warm and dry.  Neurological:     General: No focal deficit present.     Mental Status: She is alert.  Psychiatric:        Mood and Affect: Mood normal.        Behavior: Behavior normal.     Lab Results  Component Value Date   WBC 6.6 06/03/2022   HGB 13.5 06/03/2022   HCT 40.7 06/03/2022   PLT 152.0 06/03/2022   GLUCOSE 89 06/03/2022   CHOL 122 06/03/2022   TRIG 52.0 06/03/2022   HDL 48.70 06/03/2022   LDLCALC 63 06/03/2022   ALT 14 06/03/2022   AST 20 06/03/2022   NA 139 06/03/2022   K 4.3 06/03/2022   CL 108 06/03/2022   CREATININE 0.90 06/03/2022   BUN 16 06/03/2022   CO2 30 06/03/2022   TSH 1.23 06/03/2022   INR 1.0 06/03/2022   HGBA1C 5.4 02/18/2015    MM 3D SCREEN BREAST BILATERAL  Result Date: 06/29/2022 CLINICAL DATA:  Screening. EXAM: DIGITAL SCREENING BILATERAL MAMMOGRAM WITH TOMOSYNTHESIS AND CAD TECHNIQUE: Bilateral screening digital craniocaudal and mediolateral oblique mammograms were obtained. Bilateral screening digital breast tomosynthesis was performed. The images were evaluated with computer-aided detection. COMPARISON:  Previous exam(s). ACR Breast Density Category c: The breast tissue is heterogeneously dense, which may obscure small masses. FINDINGS: There are no findings suspicious for malignancy. IMPRESSION: No mammographic evidence of malignancy. A result letter of this screening mammogram will be mailed directly to the patient. RECOMMENDATION: Screening mammogram in one year. (Code:SM-B-01Y) BI-RADS CATEGORY  1: Negative. Electronically Signed   By: Emmaline Kluver M.D.   On: 06/29/2022 13:42   DG Chest 2 View  Result Date: 08/19/2022 CLINICAL DATA:  Chest pressure. EXAM: CHEST - 2 VIEW COMPARISON:  Chest two views 03/06/2020 FINDINGS: Cardiac silhouette is at the upper limits of normal size. Moderate calcifications are again seen within the aortic arch. Lungs are clear. No pleural  effusion or pneumothorax. Moderate anterior height loss of three adjacent midthoracic vertebral bodies, T6 through T8 and prior CT, unchanged from multiple comparison studies. Moderate midthoracic spine multilevel anterior disc space narrowing. IMPRESSION: 1. No active cardiopulmonary disease. 2. Chronic moderate anterior height loss of three adjacent midthoracic vertebral bodies. Electronically Signed   By: Neita Garnet M.D.   On: 08/19/2022 12:19      DG Chest 2 View  Result Date: 08/19/2022 CLINICAL DATA:  Chest pressure. EXAM: CHEST - 2 VIEW COMPARISON:  Chest two views 03/06/2020 FINDINGS: Cardiac silhouette is  at the upper limits of normal size. Moderate calcifications are again seen within the aortic arch. Lungs are clear. No pleural effusion or pneumothorax. Moderate anterior height loss of three adjacent midthoracic vertebral bodies, T6 through T8 and prior CT, unchanged from multiple comparison studies. Moderate midthoracic spine multilevel anterior disc space narrowing. IMPRESSION: 1. No active cardiopulmonary disease. 2. Chronic moderate anterior height loss of three adjacent midthoracic vertebral bodies. Electronically Signed   By: Yvonne Kendall M.D.   On: 08/19/2022 12:19      Assessment & Plan:   Leveda was seen today for hypertension.  Diagnoses and all orders for this visit:  Primary hypertension -     hydrALAZINE (APRESOLINE) 25 MG tablet; Take 1 tablet (25 mg total) by mouth 3 (three) times daily.  LVH (left ventricular hypertrophy) due to hypertensive disease, without heart failure -     hydrALAZINE (APRESOLINE) 25 MG tablet; Take 1 tablet (25 mg total) by mouth 3 (three) times daily.  Chest pressure- Her exam, chest x-ray, EKG, and labs are reassuring.  I am concerned that she is having symptoms related to poorly controlled high blood pressure.  Will add hydralazine to her current regimen. -     DG Chest 2 View; Future -     Cancel: Troponin I (High Sensitivity);  Future -     Cancel: Brain natriuretic peptide; Future -     Cancel: D-dimer, quantitative; Future -     EKG 12-Lead -     Troponin I (High Sensitivity); Future -     Brain natriuretic peptide; Future -     D-dimer, quantitative; Future   I am having Veronique F. Ola start on hydrALAZINE. I am also having her maintain her aspirin, zolpidem, SUMAtriptan, pantoprazole, DULoxetine, imipramine, metoprolol succinate, topiramate, olmesartan, atorvastatin, and spironolactone.  Meds ordered this encounter  Medications   hydrALAZINE (APRESOLINE) 25 MG tablet    Sig: Take 1 tablet (25 mg total) by mouth 3 (three) times daily.    Dispense:  270 tablet    Refill:  0     Follow-up: Return in about 3 weeks (around 09/07/2022).  Scarlette Calico, MD

## 2022-08-17 NOTE — Patient Instructions (Addendum)
[address]   Nonspecific Chest Pain, Adult Chest pain is an uncomfortable, tight, or painful feeling in the chest. The pain can feel like a crushing, aching, or squeezing pressure. A person can feel a burning or tingling sensation. Chest pain can also be felt in your back, neck, jaw, shoulder, or arm. This pain can be worse when you move, sneeze, or take a deep breath. Chest pain can be caused by a condition that is life-threatening. This must be treated right away. It can also be caused by something that is not life-threatening. If you have chest pain, it can be hard to know the difference, so it is important to get help right away to make sure that you do not have a serious condition. Some life-threatening causes of chest pain include: Heart attack. A tear in the body's main blood vessel (aortic dissection). Inflammation around your heart (pericarditis). A problem in the lungs, such as a blood clot (pulmonary embolism) or a collapsed lung (pneumothorax). Some non life-threatening causes of chest pain include: Heartburn. Anxiety or stress. Damage to the bones, muscles, and cartilage that make up your chest wall. Pneumonia or bronchitis. Shingles infection (varicella-zoster virus). Your chest pain may come and go. It may also be constant. Your health care provider will do tests and other studies to find the cause of your pain. Treatment will depend on the cause of your chest pain. Follow these instructions at home: Medicines Take over-the-counter and prescription medicines only as told by your health care provider. If you were prescribed an antibiotic medicine, take it as told by your health care provider. Do not stop taking the antibiotic even if you start to feel better. Activity Avoid any activities that cause chest pain. Do not lift anything that is heavier than 10 lb (4.5 kg), or the limit that you are told, until your health care provider says that it is safe. Rest  as directed by your health care provider. Return to your normal activities only as told by your health care provider. Ask your health care provider what activities are safe for you. Lifestyle     Do not use any products that contain nicotine or tobacco, such as cigarettes, e-cigarettes, and chewing tobacco. If you need help quitting, ask your health care provider. Do not drink alcohol. Make healthy lifestyle changes as recommended. These may include: Getting regular exercise. Ask your health care provider to suggest some exercises that are safe for you. Eating a heart-healthy diet. This includes plenty of fresh fruits and vegetables, whole grains, low-fat (lean) protein, and low-fat dairy products. A dietitian can help you find healthy eating options. Maintaining a healthy weight. Managing any other health conditions you may have, such as high blood pressure (hypertension) or diabetes. Reducing stress, such as with yoga or relaxation techniques. General instructions Pay attention to any changes in your symptoms. It is up to you to get the results of any tests that were done. Ask your health care provider, or the department that is doing the tests, when your results will be ready. Keep all follow-up visits as told by your health care provider. This is important. You may be asked to go for further testing if your chest pain does not go away. Contact a health care provider if: Your chest pain does not go away. You feel depressed. You have a fever. You notice changes in your symptoms or develop new symptoms. Get help right away if: Your chest pain gets worse. You have  a cough that gets worse, or you cough up blood. You have severe pain in your abdomen. You faint. You have sudden, unexplained chest discomfort. You have sudden, unexplained discomfort in your arms, back, neck, or jaw. You have shortness of breath at any time. You suddenly start to sweat, or your skin gets clammy. You feel  nausea or you vomit. You suddenly feel lightheaded or dizzy. You have severe weakness, or unexplained weakness or fatigue. Your heart begins to beat quickly, or it feels like it is skipping beats. These symptoms may represent a serious problem that is an emergency. Do not wait to see if the symptoms will go away. Get medical help right away. Call your local emergency services (911 in the [place] Do not drive yourself to the hospital. Summary Chest pain can be caused by a condition that is serious and requires urgent treatment. It may also be caused by something that is not life-threatening. Your health care provider may do lab tests and other studies to find the cause of your pain. Follow your health care provider's instructions on taking medicines, making lifestyle changes, and getting emergency treatment if symptoms become worse. Keep all follow-up visits as told by your health care provider. This includes visits for any further testing if your chest pain does not go away. This information is not intended to replace advice given to you by your health care provider. Make sure you discuss any questions you have with your health care provider. Document Revised: 01/09/2021 Document Reviewed: 01/09/2021 Elsevier Patient Education  [employer].

## 2022-08-19 ENCOUNTER — Other Ambulatory Visit: Payer: Medicare PPO

## 2022-08-19 ENCOUNTER — Ambulatory Visit (INDEPENDENT_AMBULATORY_CARE_PROVIDER_SITE_OTHER): Payer: Medicare PPO

## 2022-08-19 DIAGNOSIS — R0789 Other chest pain: Secondary | ICD-10-CM

## 2022-08-20 MED ORDER — HYDRALAZINE HCL 25 MG PO TABS: 25.0000 mg | ORAL_TABLET | Freq: Three times a day (TID) | ORAL | 0 refills | Status: DC

## 2022-11-30 ENCOUNTER — Other Ambulatory Visit: Payer: Self-pay | Admitting: Internal Medicine

## 2022-11-30 DIAGNOSIS — I119 Hypertensive heart disease without heart failure: Secondary | ICD-10-CM

## 2022-11-30 DIAGNOSIS — I10 Essential (primary) hypertension: Secondary | ICD-10-CM

## 2022-12-04 ENCOUNTER — Telehealth: Payer: Self-pay | Admitting: Internal Medicine

## 2022-12-04 DIAGNOSIS — I119 Hypertensive heart disease without heart failure: Secondary | ICD-10-CM

## 2022-12-04 DIAGNOSIS — I10 Essential (primary) hypertension: Secondary | ICD-10-CM

## 2022-12-04 MED ORDER — HYDRALAZINE HCL 25 MG PO TABS: 25.0000 mg | ORAL_TABLET | Freq: Three times a day (TID) | ORAL | 2 refills | Status: DC

## 2022-12-04 NOTE — Telephone Encounter (Signed)
Refill sent to [facility].Marland KitchenJohny Chess

## 2022-12-04 NOTE — Telephone Encounter (Signed)
Caller & Relationship to patient: Pharmacy  Call back number: [phone]   Date of last office visit: 10.9.23  Date of next office visit: N/A  Medication(s) to be refilled:  hydrALAZINE (APRESOLINE) 25 MG tablet   Preferred Pharmacy:   [facility]   Phone: [phone]  Fax: [phone]

## 2022-12-17 DIAGNOSIS — M791 Myalgia, unspecified site: Secondary | ICD-10-CM | POA: Diagnosis not present

## 2022-12-17 DIAGNOSIS — R070 Pain in throat: Secondary | ICD-10-CM | POA: Diagnosis not present

## 2022-12-17 DIAGNOSIS — J0100 Acute maxillary sinusitis, unspecified: Secondary | ICD-10-CM | POA: Diagnosis not present

## 2022-12-24 NOTE — Addendum Note (Signed)
Addended by: Janith Lima on: 12/24/2022 01:59 PM   Modules accepted: Orders

## 2023-02-01 ENCOUNTER — Telehealth: Payer: Self-pay | Admitting: Internal Medicine

## 2023-02-01 DIAGNOSIS — I10 Essential (primary) hypertension: Secondary | ICD-10-CM

## 2023-02-01 DIAGNOSIS — I119 Hypertensive heart disease without heart failure: Secondary | ICD-10-CM

## 2023-02-01 DIAGNOSIS — E269 Hyperaldosteronism, unspecified: Secondary | ICD-10-CM

## 2023-02-01 MED ORDER — SPIRONOLACTONE 25 MG PO TABS: 25.0000 mg | ORAL_TABLET | Freq: Every day | ORAL | 1 refills | Status: DC

## 2023-02-01 MED ORDER — METOPROLOL SUCCINATE ER 25 MG PO TB24: 25.0000 mg | ORAL_TABLET | Freq: Every evening | ORAL | 1 refills | Status: DC

## 2023-02-01 NOTE — Telephone Encounter (Signed)
Sent rx to [facility]Marland KitchenAndee Poles

## 2023-02-01 NOTE — Telephone Encounter (Signed)
Prescription Request  02/01/2023  LOV: 08/17/2022  What is the name of the medication or equipment? Spironalactone, metoprolol  Have you contacted your pharmacy to request a refill? No   Which pharmacy would you like this sent to?   [facility] [address] [place] [address] Phone: [phone] Fax: [phone]    Patient notified that their request is being sent to the clinical staff for review and that they should receive a response within 2 business days.   Please advise at Mobile [phone] (mobile)

## 2023-02-03 ENCOUNTER — Telehealth: Payer: Self-pay

## 2023-02-03 NOTE — Telephone Encounter (Signed)
Contacted Kelli Contreras to schedule their annual wellness visit. Appointment made for 02/24/23.  Norton Blizzard, [place] ([facility])  [facility] [phone]

## 2023-02-08 ENCOUNTER — Other Ambulatory Visit: Payer: Self-pay | Admitting: Internal Medicine

## 2023-02-08 DIAGNOSIS — E269 Hyperaldosteronism, unspecified: Secondary | ICD-10-CM

## 2023-02-08 DIAGNOSIS — I10 Essential (primary) hypertension: Secondary | ICD-10-CM

## 2023-02-08 DIAGNOSIS — G43709 Chronic migraine without aura, not intractable, without status migrainosus: Secondary | ICD-10-CM

## 2023-02-24 ENCOUNTER — Ambulatory Visit (INDEPENDENT_AMBULATORY_CARE_PROVIDER_SITE_OTHER): Payer: Medicare PPO

## 2023-02-24 ENCOUNTER — Encounter: Payer: Self-pay | Admitting: Internal Medicine

## 2023-02-24 ENCOUNTER — Ambulatory Visit: Payer: Medicare PPO | Admitting: Internal Medicine

## 2023-02-24 VITALS — BP 136/86 | HR 80 | Temp 98.1°F | Ht 62.0 in | Wt 174.0 lb

## 2023-02-24 VITALS — BP 118/80 | HR 80 | Temp 98.1°F | Ht 62.0 in | Wt 174.0 lb

## 2023-02-24 DIAGNOSIS — I10 Essential (primary) hypertension: Secondary | ICD-10-CM | POA: Diagnosis not present

## 2023-02-24 DIAGNOSIS — N1831 Chronic kidney disease, stage 3a: Secondary | ICD-10-CM | POA: Diagnosis not present

## 2023-02-24 DIAGNOSIS — I728 Aneurysm of other specified arteries: Secondary | ICD-10-CM

## 2023-02-24 DIAGNOSIS — Z0000 Encounter for general adult medical examination without abnormal findings: Secondary | ICD-10-CM

## 2023-02-24 DIAGNOSIS — E785 Hyperlipidemia, unspecified: Secondary | ICD-10-CM

## 2023-02-24 DIAGNOSIS — R911 Solitary pulmonary nodule: Secondary | ICD-10-CM

## 2023-02-24 DIAGNOSIS — E2839 Other primary ovarian failure: Secondary | ICD-10-CM | POA: Insufficient documentation

## 2023-02-24 HISTORY — DX: Other primary ovarian failure: E28.39

## 2023-02-24 LAB — BASIC METABOLIC PANEL
BUN: 14 mg/dL (ref 6–23)
CO2: 28 mEq/L (ref 19–32)
Calcium: 9 mg/dL (ref 8.4–10.5)
Chloride: 107 mEq/L (ref 96–112)
Creatinine, Ser: 0.93 mg/dL (ref 0.40–1.20)
GFR: 62.78 mL/min (ref >60.00)
Glucose, Bld: 79 mg/dL (ref 70–99)
Potassium: 4.2 mEq/L (ref 3.5–5.1)
Sodium: 141 mEq/L (ref 135–145)

## 2023-02-24 LAB — CBC WITH DIFFERENTIAL/PLATELET
Basophils Absolute: 0 10*3/uL (ref 0.0–0.1)
Basophils Relative: 0.2 % (ref 0.0–3.0)
Eosinophils Absolute: 0 10*3/uL (ref 0.0–0.7)
Eosinophils Relative: 0 % (ref 0.0–5.0)
HCT: 41.5 % (ref 36.0–46.0)
Hemoglobin: 13.9 g/dL (ref 12.0–15.0)
Lymphocytes Relative: 20.1 % (ref 12.0–46.0)
Lymphs Abs: 1.2 10*3/uL (ref 0.7–4.0)
MCHC: 33.5 g/dL (ref 30.0–36.0)
MCV: 89.4 fl (ref 78.0–100.0)
Monocytes Absolute: 0.5 10*3/uL (ref 0.1–1.0)
Monocytes Relative: 8.3 % (ref 3.0–12.0)
Neutro Abs: 4.4 10*3/uL (ref 1.4–7.7)
Neutrophils Relative %: 71.4 % (ref 43.0–77.0)
Platelets: 170 10*3/uL (ref 150.0–400.0)
RBC: 4.64 Mil/uL (ref 3.87–5.11)
RDW: 14.1 % (ref 11.5–15.5)
WBC: 6.2 10*3/uL (ref 4.0–10.5)

## 2023-02-24 LAB — HEPATIC FUNCTION PANEL
ALT: 13 U/L (ref 0–35)
AST: 18 U/L (ref 0–37)
Albumin: 3.9 g/dL (ref 3.5–5.2)
Alkaline Phosphatase: 88 U/L (ref 39–117)
Bilirubin, Direct: 0.2 mg/dL (ref 0.0–0.3)
Total Bilirubin: 0.6 mg/dL (ref 0.2–1.2)
Total Protein: 6.5 g/dL (ref 6.0–8.3)

## 2023-02-24 LAB — TSH: TSH: 1.42 u[IU]/mL (ref 0.35–5.50)

## 2023-02-24 NOTE — Progress Notes (Signed)
Subjective:   Kelli Contreras is a 72 y.o. female who presents for Medicare Annual (Subsequent) preventive examination.  Review of Systems     Cardiac Risk Factors include: advanced age (>85men, >78 women);hypertension;dyslipidemia;obesity (BMI >30kg/m2);sedentary lifestyle;family history of premature cardiovascular disease     Objective:    Today's Vitals   02/24/23 1057  BP: 118/80  Pulse: 80  Temp: 98.1 F (36.7 C)  TempSrc: Oral  SpO2: 96%  Weight: 174 lb (78.9 kg)  Height:  (1.575 m)  PainSc: 0-No pain   Body mass index is 31.83 kg/m.     02/24/2023   11:21 AM 02/09/2022    2:07 PM 02/27/2020    2:16 PM 01/22/2020    2:35 PM  Advanced Directives  Does Patient Have a Medical Advance Directive? No No No No  Would patient like information on creating a medical advance directive? No - Patient declined No - Patient declined No - Patient declined Yes (ED - Information included in AVS)    Current Medications (verified) Outpatient Encounter Medications as of 02/24/2023  Medication Sig   aspirin 81 MG tablet Take 81 mg by mouth at bedtime.    atorvastatin (LIPITOR) 20 MG tablet TAKE 1 TABLET EVERY DAY   DULoxetine (CYMBALTA) 30 MG capsule Take 1 capsule (30 mg total) by mouth daily.   hydrALAZINE (APRESOLINE) 25 MG tablet Take 1 tablet (25 mg total) by mouth 3 (three) times daily.   imipramine (TOFRANIL) 50 MG tablet Take 2 tablets (100 mg total) by mouth at bedtime.   metoprolol succinate (TOPROL-XL) 25 MG 24 hr tablet Take 1 tablet (25 mg total) by mouth at bedtime. Annual appt due in July must see provider for future refills   olmesartan (BENICAR) 40 MG tablet TAKE 1 TABLET EVERY DAY   pantoprazole (PROTONIX) 40 MG tablet Take 1 tablet (40 mg total) by mouth daily.   spironolactone (ALDACTONE) 25 MG tablet Take 1 tablet (25 mg total) by mouth daily. Annual appt due in July must see provider for future refills   SUMAtriptan (IMITREX) 100 MG tablet Take 1/2 tab ( )  at onset of migraine. May take other half tab in 2 hours if headache persists or recurs.   topiramate (TOPAMAX) 50 MG tablet TAKE 1 TABLET TWICE DAILY   zolpidem (AMBIEN) 5 MG tablet Take 1 tablet (5 mg total) by mouth at bedtime as needed. (Patient taking differently: Take 5 mg by mouth at bedtime as needed for sleep.)   No facility-administered encounter medications on file as of 02/24/2023.    Allergies (verified) Lisinopril and Other   History: Past Medical History:  Diagnosis Date   Cataract    bil cataracts removed   Depression    Diarrhea    Hypertension    IBS (irritable bowel syndrome)    diarrhea   Insomnia    Migraine    Osteoporosis    [employer] 06/11/2020   Plantar fasciitis    Past Surgical History:  Procedure Laterality Date   ABDOMINAL HYSTERECTOMY     DUB; ovaries intact.   BREAST CYST ASPIRATION Bilateral    BUNIONECTOMY  2007   Dr. Lestine Box   EYE SURGERY N/A    [employer] 06/11/2020   VESICOVAGINAL FISTULA CLOSURE W/ TAH  1985   Dr. Nicholas Lose   Family History  Problem Relation Age of Onset   CAD Father 26   Cancer Father        bladder cancer   Diabetes Father  Heart disease Father    Hyperlipidemia Father    Hypertension Father    Valvular heart disease Mother        MVP   Heart disease Mother    Hyperlipidemia Mother    Hypertension Mother    Hypertension Brother    Hypertension Other        mother, father, brother   Breast cancer Maternal Aunt    Colon cancer Neg Hx    Esophageal cancer Neg Hx    Rectal cancer Neg Hx    Stomach cancer Neg Hx    Social History   Socioeconomic History   Marital status: Widowed    Spouse name: Not on file   Number of children: 2   Years of education: Not on file   Highest education level: Not on file  Occupational History   Not on file  Tobacco Use   Smoking status: Never    Passive exposure: Never   Smokeless tobacco: Never  Vaping Use   Vaping Use: Never used  Substance and Sexual Activity    Alcohol use: No   Drug use: Never   Sexual activity: Not Currently    Partners: Male  Other Topics Concern   Not on file  Social History Narrative         Social Determinants of Health   Financial Resource Strain: Low Risk  (02/24/2023)   Overall Financial Resource Strain (CARDIA)    Difficulty of Paying Living Expenses: Not hard at all  Food Insecurity: No Food Insecurity (02/24/2023)   Hunger Vital Sign    Worried About Running Out of Food in the Last Year: Never true    Ran Out of Food in the Last Year: Never true  Transportation Needs: No Transportation Needs (02/24/2023)   PRAPARE - Administrator, Civil Service (Medical): No    Lack of Transportation (Non-Medical): No  Physical Activity: Inactive (02/24/2023)   Exercise Vital Sign    Days of Exercise per Week: 0 days    Minutes of Exercise per Session: 0 min  Stress: No Stress Concern Present (02/24/2023)   [employer] of Occupational Health - Occupational Stress Questionnaire    Feeling of Stress : Not at all  Social Connections: Moderately Integrated (02/24/2023)   Social Connection and Isolation Panel [NHANES]    Frequency of Communication with Friends and Family: More than three times a week    Frequency of Social Gatherings with Friends and Family: More than three times a week    Attends Religious Services: More than 4 times per year    Active Member of [employer] or Organizations: Yes    Attends Banker Meetings: More than 4 times per year    Marital Status: Widowed  Recent Concern: Social Connections - Moderately Isolated (02/24/2023)   Social Connection and Isolation Panel [NHANES]    Frequency of Communication with Friends and Family: Twice a week    Frequency of Social Gatherings with Friends and Family: Twice a week    Attends Religious Services: Never    Database administrator or Organizations: Yes    Attends Banker Meetings: 1 to 4 times per year    Marital Status:  Widowed    Tobacco Counseling Counseling given: Not Answered   Clinical Intake:  Pre-visit preparation completed: Yes  Pain : No/denies pain Pain Score: 0-No pain     BMI - recorded: 31.83 Nutritional Status: BMI > 30  Obese Nutritional Risks: None Diabetes:  No  How often do you need to have someone help you when you read instructions, pamphlets, or other written materials from your doctor or pharmacy?: 1 - Never What is the last grade level you completed in school?: HSG  Diabetic? No  Interpreter Needed?: No  Information entered by :: Susie Cassette, LPN.   Activities of Daily Living    02/24/2023   10:59 AM  In your present state of health, do you have any difficulty performing the following activities:  Hearing? 0  Vision? 0  Difficulty concentrating or making decisions? 0  Walking or climbing stairs? 0  Dressing or bathing? 0  Doing errands, shopping? 0  Preparing Food and eating ? N  Using the Toilet? N  In the past six months, have you accidently leaked urine? N  Do you have problems with loss of bowel control? N  Managing your Medications? N  Managing your Finances? N  Housekeeping or managing your Housekeeping? N    Patient Care Team: Etta Grandchild, MD as PCP - General (Internal Medicine)  Indicate any recent Medical Services you may have received from other than [facility] providers in the past year (date may be approximate).     Assessment:   This is a routine wellness examination for Sarahbeth.  Hearing/Vision screen Hearing Screening - Comments:: Denies hearing difficulties   Vision Screening - Comments:: Wears rx glasses - up to date with routine eye exams with Maris Berger, MD.   Dietary issues and exercise activities discussed: Current Exercise Habits: The patient does not participate in regular exercise at present, Exercise limited by: cardiac condition(s)   Goals Addressed             This Visit's Progress    My goal for 2024  is to maintain my health.        Depression Screen    02/24/2023   10:45 AM 02/09/2022    2:08 PM 09/24/2021   10:33 AM 08/13/2020   10:36 AM 06/21/2020   11:40 AM 06/14/2020   11:38 AM 06/11/2020    8:14 AM  PHQ 2/9 Scores  PHQ - 2 Score 0 0 0 0 0 0 0  PHQ- 9 Score 0          Fall Risk    02/24/2023   10:45 AM 02/09/2022    2:08 PM 09/24/2021   10:33 AM 08/13/2020   10:35 AM 06/21/2020   11:40 AM  Fall Risk   Falls in the past year? 0 0 1 0 0  Number falls in past yr: 0 0 1 0 0  Injury with Fall? 0 0 0 0 0  Risk for fall due to : No Fall Risks  History of fall(s) No Fall Risks   Follow up Falls evaluation completed Falls evaluation completed Falls evaluation completed Falls evaluation completed Falls evaluation completed    FALL RISK PREVENTION PERTAINING TO THE HOME:  Any stairs in or around the home? No  If so, are there any without handrails? No  Home free of loose throw rugs in walkways, pet beds, electrical cords, etc? Yes  Adequate lighting in your home to reduce risk of falls? Yes   ASSISTIVE DEVICES UTILIZED TO PREVENT FALLS:  Life alert? No  Use of a cane, walker or w/c? No  Grab bars in the bathroom? Yes Shower chair or bench in shower? Yes Elevated toilet seat or a handicapped toilet? No   TIMED UP AND GO:  Was the test performed? Yes .  Length of time to ambulate 10 feet: 8 sec.   Gait steady and fast without use of assistive device  Cognitive Function:        02/24/2023   10:59 AM 01/22/2020    2:35 PM  6CIT Screen  What Year? 0 points 0 points  What month? 0 points 0 points  What time? 0 points 0 points  Count back from 20 0 points 0 points  Months in reverse 0 points 0 points  Repeat phrase 0 points 4 points  Total Score 0 points 4 points    Immunizations Immunization History  Administered Date(s) Administered   Tdap 02/11/2017    TDAP status: Up to date  Flu Vaccine status: Declined, Education has been provided regarding the importance  of this vaccine but patient still declined. Advised may receive this vaccine at local pharmacy or Health Dept. Aware to provide a copy of the vaccination record if obtained from local pharmacy or Health Dept. Verbalized acceptance and understanding.  Pneumococcal vaccine status: Declined,  Education has been provided regarding the importance of this vaccine but patient still declined. Advised may receive this vaccine at local pharmacy or Health Dept. Aware to provide a copy of the vaccination record if obtained from local pharmacy or Health Dept. Verbalized acceptance and understanding.   Covid-19 vaccine status: Declined, Education has been provided regarding the importance of this vaccine but patient still declined. Advised may receive this vaccine at local pharmacy or Health Dept.or vaccine clinic. Aware to provide a copy of the vaccination record if obtained from local pharmacy or Health Dept. Verbalized acceptance and understanding.  Qualifies for Shingles Vaccine? Yes   Zostavax completed No   Shingrix Completed?: No.    Education has been provided regarding the importance of this vaccine. Patient has been advised to call insurance company to determine out of pocket expense if they have not yet received this vaccine. Advised may also receive vaccine at local pharmacy or Health Dept. Verbalized acceptance and understanding.  Screening Tests Health Maintenance  Topic Date Due   COVID-19 Vaccine (1) 03/12/2023 (Originally 04/20/1952)   Zoster Vaccines- Shingrix (1 of 2) 05/26/2023 (Originally 10/21/2003)   Pneumonia Vaccine 17+ Years old (1 of 1 - PCV) 02/24/2024 (Originally 10/20/2018)   INFLUENZA VACCINE  06/10/2023   MAMMOGRAM  06/27/2023   Medicare Annual Wellness (AWV)  02/24/2024   DTaP/Tdap/Td (2 - Td or Tdap) 02/12/2027   COLONOSCOPY (Pts 45-36yrs Insurance coverage will need to be confirmed)  07/25/2028   DEXA SCAN  Completed   Hepatitis C Screening  Completed   HPV VACCINES  Aged  Out    Health Maintenance  There are no preventive care reminders to display for this patient.   Colorectal cancer screening: Type of screening: Colonoscopy. Completed 07/25/2018. Repeat every 10 years  Mammogram status: Completed 06/26/2022. Repeat every year  Bone Density status: Ordered 02/24/2023 by PCP. Pt provided with contact info and advised to call to schedule appt.  Lung Cancer Screening: (Low Dose CT Chest recommended if Age 58-80 years, 30 pack-year currently smoking OR have quit w/in 15years.) does not qualify.   Lung Cancer Screening Referral: No  Additional Screening:  Hepatitis C Screening: does qualify; Completed: 05/16/2016  Vision Screening: Recommended annual ophthalmology exams for early detection of glaucoma and other disorders of the eye. Is the patient up to date with their annual eye exam?  Yes  Who is the provider or what is the name of the office in which the patient attends  annual eye exams? Maris Berger, MD. If pt is not established with a provider, would they like to be referred to a provider to establish care? No .   Dental Screening: Recommended annual dental exams for proper oral hygiene  Community Resource Referral / Chronic Care Management: CRR required this visit?  No   CCM required this visit?  No      Plan:     I have personally reviewed and noted the following in the patient's chart:   Medical and social history Use of alcohol, tobacco or illicit drugs  Current medications and supplements including opioid prescriptions. Patient is not currently taking opioid prescriptions. Functional ability and status Nutritional status Physical activity Advanced directives List of other physicians Hospitalizations, surgeries, and ER visits in previous 12 months Vitals Screenings to include cognitive, depression, and falls Referrals and appointments  In addition, I have reviewed and discussed with patient certain preventive protocols,  quality metrics, and best practice recommendations. A written personalized care plan for preventive services as well as general preventive health recommendations were provided to patient.     Mickeal Needy, LPN   1/61/0960   Nurse Notes:  Normal cognitive status assessed by direct observation by this Nurse Health Advisor. No abnormalities found.

## 2023-02-24 NOTE — Patient Instructions (Signed)
Hypertension, Adult High blood pressure (hypertension) is when the force of blood pumping through the arteries is too strong. The arteries are the blood vessels that carry blood from the heart throughout the body. Hypertension forces the heart to work harder to pump blood and may cause arteries to become narrow or stiff. Untreated or uncontrolled hypertension can lead to a heart attack, heart failure, a stroke, kidney disease, and other problems. A blood pressure reading consists of a higher number over a lower number. Ideally, your blood pressure should be below 120/80. The first ("top") number is called the systolic pressure. It is a measure of the pressure in your arteries as your heart beats. The second ("bottom") number is called the diastolic pressure. It is a measure of the pressure in your arteries as the heart relaxes. What are the causes? The exact cause of this condition is not known. There are some conditions that result in high blood pressure. What increases the risk? Certain factors may make you more likely to develop high blood pressure. Some of these risk factors are under your control, including: Smoking. Not getting enough exercise or physical activity. Being overweight. Having too much fat, sugar, calories, or salt (sodium) in your diet. Drinking too much alcohol. Other risk factors include: Having a personal history of heart disease, diabetes, high cholesterol, or kidney disease. Stress. Having a family history of high blood pressure and high cholesterol. Having obstructive sleep apnea. Age. The risk increases with age. What are the signs or symptoms? High blood pressure may not cause symptoms. Very high blood pressure (hypertensive crisis) may cause: Headache. Fast or irregular heartbeats (palpitations). Shortness of breath. Nosebleed. Nausea and vomiting. Vision changes. Severe chest pain, dizziness, and seizures. How is this diagnosed? This condition is diagnosed by  measuring your blood pressure while you are seated, with your arm resting on a flat surface, your legs uncrossed, and your feet flat on the floor. The cuff of the blood pressure monitor will be placed directly against the skin of your upper arm at the level of your heart. Blood pressure should be measured at least twice using the same arm. Certain conditions can cause a difference in blood pressure between your right and left arms. If you have a high blood pressure reading during one visit or you have normal blood pressure with other risk factors, you may be asked to: Return on a different day to have your blood pressure checked again. Monitor your blood pressure at home for 1 week or longer. If you are diagnosed with hypertension, you may have other blood or imaging tests to help your health care provider understand your overall risk for other conditions. How is this treated? This condition is treated by making healthy lifestyle changes, such as eating healthy foods, exercising more, and reducing your alcohol intake. You may be referred for counseling on a healthy diet and physical activity. Your health care provider may prescribe medicine if lifestyle changes are not enough to get your blood pressure under control and if: Your systolic blood pressure is above 130. Your diastolic blood pressure is above 80. Your personal target blood pressure may vary depending on your medical conditions, your age, and other factors. Follow these instructions at home: Eating and drinking  Eat a diet that is high in fiber and potassium, and low in sodium, added sugar, and fat. An example of this eating plan is called the DASH diet. DASH stands for Dietary Approaches to Stop Hypertension. To eat this way: Eat   plenty of fresh fruits and vegetables. Try to fill one half of your plate at each meal with fruits and vegetables. Eat whole grains, such as whole-wheat pasta, brown rice, or whole-grain bread. Fill about one  fourth of your plate with whole grains. Eat or drink low-fat dairy products, such as skim milk or low-fat yogurt. Avoid fatty cuts of meat, processed or cured meats, and poultry with skin. Fill about one fourth of your plate with lean proteins, such as fish, chicken without skin, beans, eggs, or tofu. Avoid pre-made and processed foods. These tend to be higher in sodium, added sugar, and fat. Reduce your daily sodium intake. Many people with hypertension should eat less than 1,500 mg of sodium a day. Do not drink alcohol if: Your health care provider tells you not to drink. You are pregnant, may be pregnant, or are planning to become pregnant. If you drink alcohol: Limit how much you have to: 0-1 drink a day for women. 0-2 drinks a day for men. Know how much alcohol is in your drink. In the [place] one drink equals one 12 oz bottle of beer (355 mL), one 5 oz glass of wine (148 mL), or one 1 oz glass of hard liquor (44 mL). Lifestyle  Work with your health care provider to maintain a healthy body weight or to lose weight. Ask what an ideal weight is for you. Get at least 30 minutes of exercise that causes your heart to beat faster (aerobic exercise) most days of the week. Activities may include walking, swimming, or biking. Include exercise to strengthen your muscles (resistance exercise), such as Pilates or lifting weights, as part of your weekly exercise routine. Try to do these types of exercises for 30 minutes at least 3 days a week. Do not use any products that contain nicotine or tobacco. These products include cigarettes, chewing tobacco, and vaping devices, such as e-cigarettes. If you need help quitting, ask your health care provider. Monitor your blood pressure at home as told by your health care provider. Keep all follow-up visits. This is important. Medicines Take over-the-counter and prescription medicines only as told by your health care provider. Follow directions carefully. Blood  pressure medicines must be taken as prescribed. Do not skip doses of blood pressure medicine. Doing this puts you at risk for problems and can make the medicine less effective. Ask your health care provider about side effects or reactions to medicines that you should watch for. Contact a health care provider if you: Think you are having a reaction to a medicine you are taking. Have headaches that keep coming back (recurring). Feel dizzy. Have swelling in your ankles. Have trouble with your vision. Get help right away if you: Develop a severe headache or confusion. Have unusual weakness or numbness. Feel faint. Have severe pain in your chest or abdomen. Vomit repeatedly. Have trouble breathing. These symptoms may be an emergency. Get help right away. Call 911. Do not wait to see if the symptoms will go away. Do not drive yourself to the hospital. Summary Hypertension is when the force of blood pumping through your arteries is too strong. If this condition is not controlled, it may put you at risk for serious complications. Your personal target blood pressure may vary depending on your medical conditions, your age, and other factors. For most people, a normal blood pressure is less than 120/80. Hypertension is treated with lifestyle changes, medicines, or a combination of both. Lifestyle changes include losing weight, eating a healthy,   low-sodium diet, exercising more, and limiting alcohol. This information is not intended to replace advice given to you by your health care provider. Make sure you discuss any questions you have with your health care provider. Document Revised: 09/02/2021 Document Reviewed: 09/02/2021 Elsevier Patient Education  2023 Elsevier Inc.  

## 2023-02-24 NOTE — Patient Instructions (Addendum)
Kelli Contreras , Thank you for taking time to come for your Medicare Wellness Visit. I appreciate your ongoing commitment to your health goals. Please review the following plan we discussed and let me know if I can assist you in the future.   These are the goals we discussed:  Goals      My goal for 2024 is to maintain my health.        This is a list of the screening recommended for you and due dates:  Health Maintenance  Topic Date Due   COVID-19 Vaccine (1) 03/12/2023*   Zoster (Shingles) Vaccine (1 of 2) 05/26/2023*   Pneumonia Vaccine (1 of 1 - PCV) 02/24/2024*   Flu Shot  06/10/2023   Mammogram  06/27/2023   Medicare Annual Wellness Visit  02/24/2024   DTaP/Tdap/Td vaccine (2 - Td or Tdap) 02/12/2027   Colon Cancer Screening  07/25/2028   DEXA scan (bone density measurement)  Completed   Hepatitis C Screening: USPSTF Recommendation to screen - Ages 36-79 yo.  Completed   HPV Vaccine  Aged Out  *Topic was postponed. The date shown is not the original due date.    Advanced directives: No  Conditions/risks identified: Yes  Next appointment: Follow up in one year for your annual wellness visit.   Preventive Care 93 Years and Older, Female Preventive care refers to lifestyle choices and visits with your health care provider that can promote health and wellness. What does preventive care include? A yearly physical exam. This is also called an annual well check. Dental exams once or twice a year. Routine eye exams. Ask your health care provider how often you should have your eyes checked. Personal lifestyle choices, including: Daily care of your teeth and gums. Regular physical activity. Eating a healthy diet. Avoiding tobacco and drug use. Limiting alcohol use. Practicing safe sex. Taking low-dose aspirin every day. Taking vitamin and mineral supplements as recommended by your health care provider. What happens during an annual well check? The services and screenings done  by your health care provider during your annual well check will depend on your age, overall health, lifestyle risk factors, and family history of disease. Counseling  Your health care provider may ask you questions about your: Alcohol use. Tobacco use. Drug use. Emotional well-being. Home and relationship well-being. Sexual activity. Eating habits. History of falls. Memory and ability to understand (cognition). Work and work Astronomer. Reproductive health. Screening  You may have the following tests or measurements: Height, weight, and BMI. Blood pressure. Lipid and cholesterol levels. These may be checked every 5 years, or more frequently if you are over 17 years old. Skin check. Lung cancer screening. You may have this screening every year starting at age 31 if you have a 30-pack-year history of smoking and currently smoke or have quit within the past 15 years. Fecal occult blood test (FOBT) of the stool. You may have this test every year starting at age 54. Flexible sigmoidoscopy or colonoscopy. You may have a sigmoidoscopy every 5 years or a colonoscopy every 10 years starting at age 38. Hepatitis C blood test. Hepatitis B blood test. Sexually transmitted disease (STD) testing. Diabetes screening. This is done by checking your blood sugar (glucose) after you have not eaten for a while (fasting). You may have this done every 1-3 years. Bone density scan. This is done to screen for osteoporosis. You may have this done starting at age 55. Mammogram. This may be done every 1-2 years. Talk to  your health care provider about how often you should have regular mammograms. Talk with your health care provider about your test results, treatment options, and if necessary, the need for more tests. Vaccines  Your health care provider may recommend certain vaccines, such as: Influenza vaccine. This is recommended every year. Tetanus, diphtheria, and acellular pertussis (Tdap, Td) vaccine. You  may need a Td booster every 10 years. Zoster vaccine. You may need this after age 9. Pneumococcal 13-valent conjugate (PCV13) vaccine. One dose is recommended after age 50. Pneumococcal polysaccharide (PPSV23) vaccine. One dose is recommended after age 42. Talk to your health care provider about which screenings and vaccines you need and how often you need them. This information is not intended to replace advice given to you by your health care provider. Make sure you discuss any questions you have with your health care provider. Document Released: 11/22/2015 Document Revised: 07/15/2016 Document Reviewed: 08/27/2015 Elsevier Interactive Patient Education  2017 [employer] Prevention in the Home Falls can cause injuries. They can happen to people of all ages. There are many things you can do to make your home safe and to help prevent falls. What can I do on the outside of my home? Regularly fix the edges of walkways and driveways and fix any cracks. Remove anything that might make you trip as you walk through a door, such as a raised step or threshold. Trim any bushes or trees on the path to your home. Use bright outdoor lighting. Clear any walking paths of anything that might make someone trip, such as rocks or tools. Regularly check to see if handrails are loose or broken. Make sure that both sides of any steps have handrails. Any raised decks and porches should have guardrails on the edges. Have any leaves, snow, or ice cleared regularly. Use sand or salt on walking paths during winter. Clean up any spills in your garage right away. This includes oil or grease spills. What can I do in the bathroom? Use night lights. Install grab bars by the toilet and in the tub and shower. Do not use towel bars as grab bars. Use non-skid mats or decals in the tub or shower. If you need to sit down in the shower, use a plastic, non-slip stool. Keep the floor dry. Clean up any water that spills  on the floor as soon as it happens. Remove soap buildup in the tub or shower regularly. Attach bath mats securely with double-sided non-slip rug tape. Do not have throw rugs and other things on the floor that can make you trip. What can I do in the bedroom? Use night lights. Make sure that you have a light by your bed that is easy to reach. Do not use any sheets or blankets that are too big for your bed. They should not hang down onto the floor. Have a firm chair that has side arms. You can use this for support while you get dressed. Do not have throw rugs and other things on the floor that can make you trip. What can I do in the kitchen? Clean up any spills right away. Avoid walking on wet floors. Keep items that you use a lot in easy-to-reach places. If you need to reach something above you, use a strong step stool that has a grab bar. Keep electrical cords out of the way. Do not use floor polish or wax that makes floors slippery. If you must use wax, use non-skid floor wax. Do not  have throw rugs and other things on the floor that can make you trip. What can I do with my stairs? Do not leave any items on the stairs. Make sure that there are handrails on both sides of the stairs and use them. Fix handrails that are broken or loose. Make sure that handrails are as long as the stairways. Check any carpeting to make sure that it is firmly attached to the stairs. Fix any carpet that is loose or worn. Avoid having throw rugs at the top or bottom of the stairs. If you do have throw rugs, attach them to the floor with carpet tape. Make sure that you have a light switch at the top of the stairs and the bottom of the stairs. If you do not have them, ask someone to add them for you. What else can I do to help prevent falls? Wear shoes that: Do not have high heels. Have rubber bottoms. Are comfortable and fit you well. Are closed at the toe. Do not wear sandals. If you use a stepladder: Make  sure that it is fully opened. Do not climb a closed stepladder. Make sure that both sides of the stepladder are locked into place. Ask someone to hold it for you, if possible. Clearly mark and make sure that you can see: Any grab bars or handrails. First and last steps. Where the edge of each step is. Use tools that help you move around (mobility aids) if they are needed. These include: Canes. Walkers. Scooters. Crutches. Turn on the lights when you go into a dark area. Replace any light bulbs as soon as they burn out. Set up your furniture so you have a clear path. Avoid moving your furniture around. If any of your floors are uneven, fix them. If there are any pets around you, be aware of where they are. Review your medicines with your doctor. Some medicines can make you feel dizzy. This can increase your chance of falling. Ask your doctor what other things that you can do to help prevent falls. This information is not intended to replace advice given to you by your health care provider. Make sure you discuss any questions you have with your health care provider. Document Released: 08/22/2009 Document Revised: 04/02/2016 Document Reviewed: 11/30/2014 Elsevier Interactive Patient Education  2017 [employer].

## 2023-02-24 NOTE — Progress Notes (Signed)
Subjective:  Patient ID: Kelli Contreras, female    DOB: June 17, 1951  Age: 72 y.o. MRN: [id number]  CC: Hypertension   HPI Kelli Contreras presents for f/up ---  She has rare episodes of fatigue and dizziness.  She is active and denies chest pain, shortness of breath, diaphoresis, or edema.  Outpatient Medications Prior to Visit  Medication Sig Dispense Refill   aspirin 81 MG tablet Take 81 mg by mouth at bedtime.      atorvastatin (LIPITOR) 20 MG tablet TAKE 1 TABLET EVERY DAY 90 tablet 1   DULoxetine (CYMBALTA) 30 MG capsule Take 1 capsule (30 mg total) by mouth daily. 100 capsule 1   hydrALAZINE (APRESOLINE) 25 MG tablet Take 1 tablet (25 mg total) by mouth 3 (three) times daily. 270 tablet 2   imipramine (TOFRANIL) 50 MG tablet Take 2 tablets (100 mg total) by mouth at bedtime. 200 tablet 1   metoprolol succinate (TOPROL-XL) 25 MG 24 hr tablet Take 1 tablet (25 mg total) by mouth at bedtime. Annual appt due in July must see provider for future refills 90 tablet 1   olmesartan (BENICAR) 40 MG tablet TAKE 1 TABLET EVERY DAY 90 tablet 0   pantoprazole (PROTONIX) 40 MG tablet Take 1 tablet (40 mg total) by mouth daily. 100 tablet 1   spironolactone (ALDACTONE) 25 MG tablet Take 1 tablet (25 mg total) by mouth daily. Annual appt due in July must see provider for future refills 90 tablet 1   SUMAtriptan (IMITREX) 100 MG tablet Take 1/2 tab ( ) at onset of migraine. May take other half tab in 2 hours if headache persists or recurs. 9 tablet 4   topiramate (TOPAMAX) 50 MG tablet TAKE 1 TABLET TWICE DAILY 180 tablet 0   zolpidem (AMBIEN) 5 MG tablet Take 1 tablet (5 mg total) by mouth at bedtime as needed. (Patient taking differently: Take 5 mg by mouth at bedtime as needed for sleep.) 60 tablet 0   No facility-administered medications prior to visit.    ROS Review of Systems  Constitutional:  Positive for fatigue. Negative for appetite change, chills, diaphoresis and unexpected weight  change.  HENT: Negative.    Eyes: Negative.   Respiratory:  Negative for cough, chest tightness, shortness of breath and wheezing.   Cardiovascular:  Negative for chest pain, palpitations and leg swelling.  Gastrointestinal:  Negative for abdominal pain, diarrhea, nausea and vomiting.  Endocrine: Negative.   Genitourinary: Negative.  Negative for difficulty urinating.  Musculoskeletal: Negative.   Skin: Negative.  Negative for color change.  Neurological:  Positive for dizziness. Negative for tremors, syncope, weakness and light-headedness.  Hematological:  Negative for adenopathy. Does not bruise/bleed easily.  Psychiatric/Behavioral: Negative.      Objective:  BP 136/86 (BP Location: Left Arm, Patient Position: Sitting, Cuff Size: Large)   Pulse 80   Temp 98.1 F (36.7 C) (Oral)   Ht  (1.575 m)   Wt 174 lb (78.9 kg)   SpO2 96%   BMI 31.83 kg/m   BP Readings from Last 3 Encounters:  02/24/23 118/80  02/24/23 136/86  08/17/22 (!) 144/104    Wt Readings from Last 3 Encounters:  02/24/23 174 lb (78.9 kg)  02/24/23 174 lb (78.9 kg)  08/17/22 175 lb (79.4 kg)    Physical Exam Vitals reviewed.  Constitutional:      Appearance: She is not ill-appearing.  HENT:     Nose: Nose normal.     Mouth/Throat:  Mouth: Mucous membranes are moist.  Eyes:     General: No scleral icterus.    Conjunctiva/sclera: Conjunctivae normal.  Cardiovascular:     Rate and Rhythm: Normal rate and regular rhythm.     Heart sounds: No murmur heard. Pulmonary:     Effort: Pulmonary effort is normal.     Breath sounds: No stridor. No wheezing, rhonchi or rales.  Abdominal:     General: Abdomen is flat.     Palpations: There is no mass.     Tenderness: There is no abdominal tenderness. There is no guarding.     Hernia: No hernia is present.  Musculoskeletal:     Cervical back: Neck supple.  Lymphadenopathy:     Cervical: No cervical adenopathy.  Skin:    General: Skin is warm.      Findings: No lesion or rash.  Neurological:     General: No focal deficit present.     Mental Status: She is alert. Mental status is at baseline.  Psychiatric:        Mood and Affect: Mood normal.        Behavior: Behavior normal.     Lab Results  Component Value Date   WBC 6.2 02/24/2023   HGB 13.9 02/24/2023   HCT 41.5 02/24/2023   PLT 170.0 02/24/2023   GLUCOSE 79 02/24/2023   CHOL 122 06/03/2022   TRIG 52.0 06/03/2022   HDL 48.70 06/03/2022   LDLCALC 63 06/03/2022   ALT 13 02/24/2023   AST 18 02/24/2023   NA 141 02/24/2023   K 4.2 02/24/2023   CL 107 02/24/2023   CREATININE 0.93 02/24/2023   BUN 14 02/24/2023   CO2 28 02/24/2023   TSH 1.42 02/24/2023   INR 1.0 06/03/2022   HGBA1C 5.4 02/18/2015    MM 3D SCREEN BREAST BILATERAL  Result Date: 06/29/2022 CLINICAL DATA:  Screening. EXAM: DIGITAL SCREENING BILATERAL MAMMOGRAM WITH TOMOSYNTHESIS AND CAD TECHNIQUE: Bilateral screening digital craniocaudal and mediolateral oblique mammograms were obtained. Bilateral screening digital breast tomosynthesis was performed. The images were evaluated with computer-aided detection. COMPARISON:  Previous exam(s). ACR Breast Density Category c: The breast tissue is heterogeneously dense, which may obscure small masses. FINDINGS: There are no findings suspicious for malignancy. IMPRESSION: No mammographic evidence of malignancy. A result letter of this screening mammogram will be mailed directly to the patient. RECOMMENDATION: Screening mammogram in one year. (Code:SM-B-01Y) BI-RADS CATEGORY  1: Negative. Electronically Signed   By: Emmaline Kluver M.D.   On: 06/29/2022 13:42    Assessment & Plan:   Primary hypertension- Her blood pressure is adequately well-controlled. -     Basic metabolic panel; Future -     TSH; Future -     Hepatic function panel; Future -     CBC with Differential/Platelet; Future -     Urinalysis, Routine w reflex microscopic; Future  Stage 3a chronic  kidney disease- Her renal function is stable. -     Urinalysis, Routine w reflex microscopic; Future  Lung nodule seen on imaging study -     CT CHEST WO CONTRAST; Future  Splenic artery aneurysm  Estrogen deficiency -     DG Bone Density; Future  Hyperlipidemia with target LDL less than 130- LDL goal achieved. Doing well on the statin      Follow-up: Return in about 4 months (around 06/26/2023).  Sanda Linger, MD

## 2023-02-25 LAB — URINALYSIS, ROUTINE W REFLEX MICROSCOPIC
Bilirubin Urine: NEGATIVE
Ketones, ur: NEGATIVE
Nitrite: NEGATIVE
Specific Gravity, Urine: 1.015 (ref 1.000–1.030)
Total Protein, Urine: NEGATIVE
Urine Glucose: NEGATIVE
Urobilinogen, UA: 1 (ref 0.0–1.0)
pH: 6.5 (ref 5.0–8.0)

## 2023-03-02 ENCOUNTER — Other Ambulatory Visit: Payer: Self-pay | Admitting: Internal Medicine

## 2023-03-02 DIAGNOSIS — E2839 Other primary ovarian failure: Secondary | ICD-10-CM

## 2023-03-02 DIAGNOSIS — Z1231 Encounter for screening mammogram for malignant neoplasm of breast: Secondary | ICD-10-CM

## 2023-03-15 ENCOUNTER — Other Ambulatory Visit: Payer: Self-pay | Admitting: Internal Medicine

## 2023-03-15 DIAGNOSIS — G43709 Chronic migraine without aura, not intractable, without status migrainosus: Secondary | ICD-10-CM

## 2023-03-25 ENCOUNTER — Telehealth: Payer: Self-pay | Admitting: Internal Medicine

## 2023-03-25 NOTE — Telephone Encounter (Signed)
Prescription Request  03/25/2023  LOV: 02/24/2023  What is the name of the medication or equipment? Impiramine and olmesartan  Have you contacted your pharmacy to request a refill? Yes   Which pharmacy would you like this sent to?  [facility] - [place], [place] - [address] [place] [address] Phone: [phone] Fax: [phone]    Patient notified that their request is being sent to the clinical staff for review and that they should receive a response within 2 business days.   Please advise at Mobile [phone] (mobile)

## 2023-03-26 ENCOUNTER — Other Ambulatory Visit: Payer: Self-pay | Admitting: Internal Medicine

## 2023-03-26 DIAGNOSIS — I10 Essential (primary) hypertension: Secondary | ICD-10-CM

## 2023-03-26 DIAGNOSIS — G43709 Chronic migraine without aura, not intractable, without status migrainosus: Secondary | ICD-10-CM

## 2023-03-26 MED ORDER — OLMESARTAN MEDOXOMIL 40 MG PO TABS
40.0000 mg | ORAL_TABLET | Freq: Every day | ORAL | 0 refills | Status: DC
Start: 2023-03-26 — End: 2023-06-05

## 2023-03-26 MED ORDER — IMIPRAMINE HCL 50 MG PO TABS: 100.0000 mg | ORAL_TABLET | Freq: Every day | ORAL | 1 refills | Status: DC

## 2023-03-31 ENCOUNTER — Ambulatory Visit
Admission: RE | Admit: 2023-03-31 | Discharge: 2023-03-31 | Disposition: A | Discharge status: Home / Self Care | Payer: Medicare PPO | Source: Ambulatory Visit | Attending: Internal Medicine | Admitting: Internal Medicine

## 2023-03-31 DIAGNOSIS — I2510 Atherosclerotic heart disease of native coronary artery without angina pectoris: Secondary | ICD-10-CM | POA: Diagnosis not present

## 2023-03-31 DIAGNOSIS — R911 Solitary pulmonary nodule: Secondary | ICD-10-CM | POA: Diagnosis not present

## 2023-03-31 DIAGNOSIS — I517 Cardiomegaly: Secondary | ICD-10-CM | POA: Diagnosis not present

## 2023-03-31 DIAGNOSIS — I700 Atherosclerosis of aorta: Secondary | ICD-10-CM | POA: Diagnosis not present

## 2023-05-01 ENCOUNTER — Other Ambulatory Visit: Payer: Self-pay | Admitting: Internal Medicine

## 2023-05-01 DIAGNOSIS — E785 Hyperlipidemia, unspecified: Secondary | ICD-10-CM

## 2023-06-05 ENCOUNTER — Other Ambulatory Visit: Payer: Self-pay | Admitting: Internal Medicine

## 2023-06-05 DIAGNOSIS — I10 Essential (primary) hypertension: Secondary | ICD-10-CM

## 2023-06-05 DIAGNOSIS — K219 Gastro-esophageal reflux disease without esophagitis: Secondary | ICD-10-CM

## 2023-06-05 DIAGNOSIS — G43709 Chronic migraine without aura, not intractable, without status migrainosus: Secondary | ICD-10-CM

## 2023-06-28 ENCOUNTER — Ambulatory Visit
Admission: RE | Admit: 2023-06-28 | Discharge: 2023-06-28 | Disposition: A | Discharge status: Home / Self Care | Payer: Medicare PPO | Source: Ambulatory Visit | Attending: Internal Medicine | Admitting: Internal Medicine

## 2023-06-28 DIAGNOSIS — Z1231 Encounter for screening mammogram for malignant neoplasm of breast: Secondary | ICD-10-CM | POA: Diagnosis not present

## 2023-07-25 DIAGNOSIS — J069 Acute upper respiratory infection, unspecified: Secondary | ICD-10-CM | POA: Diagnosis not present

## 2023-07-25 DIAGNOSIS — R509 Fever, unspecified: Secondary | ICD-10-CM | POA: Diagnosis not present

## 2023-07-25 DIAGNOSIS — R051 Acute cough: Secondary | ICD-10-CM | POA: Diagnosis not present

## 2023-07-25 DIAGNOSIS — R070 Pain in throat: Secondary | ICD-10-CM | POA: Diagnosis not present

## 2023-07-25 DIAGNOSIS — R0981 Nasal congestion: Secondary | ICD-10-CM | POA: Diagnosis not present

## 2023-07-26 ENCOUNTER — Other Ambulatory Visit: Payer: Self-pay | Admitting: Internal Medicine

## 2023-07-26 DIAGNOSIS — I119 Hypertensive heart disease without heart failure: Secondary | ICD-10-CM

## 2023-07-26 DIAGNOSIS — I10 Essential (primary) hypertension: Secondary | ICD-10-CM

## 2023-08-02 ENCOUNTER — Other Ambulatory Visit: Payer: Self-pay | Admitting: Internal Medicine

## 2023-08-02 DIAGNOSIS — I10 Essential (primary) hypertension: Secondary | ICD-10-CM

## 2023-08-02 DIAGNOSIS — E269 Hyperaldosteronism, unspecified: Secondary | ICD-10-CM

## 2023-08-02 DIAGNOSIS — E785 Hyperlipidemia, unspecified: Secondary | ICD-10-CM

## 2023-08-03 ENCOUNTER — Telehealth: Payer: Self-pay | Admitting: Internal Medicine

## 2023-08-30 ENCOUNTER — Ambulatory Visit: Payer: Medicare PPO | Admitting: Internal Medicine

## 2023-08-30 ENCOUNTER — Encounter: Payer: Self-pay | Admitting: Internal Medicine

## 2023-08-30 VITALS — BP 160/96 | HR 91 | Temp 97.7°F | Resp 16 | Ht 62.0 in | Wt 175.1 lb

## 2023-08-30 DIAGNOSIS — R0789 Other chest pain: Secondary | ICD-10-CM | POA: Insufficient documentation

## 2023-08-30 DIAGNOSIS — E785 Hyperlipidemia, unspecified: Secondary | ICD-10-CM

## 2023-08-30 DIAGNOSIS — I5189 Other ill-defined heart diseases: Secondary | ICD-10-CM

## 2023-08-30 DIAGNOSIS — R0602 Shortness of breath: Secondary | ICD-10-CM

## 2023-08-30 DIAGNOSIS — I10 Essential (primary) hypertension: Secondary | ICD-10-CM

## 2023-08-30 DIAGNOSIS — N1831 Chronic kidney disease, stage 3a: Secondary | ICD-10-CM

## 2023-08-30 DIAGNOSIS — G4452 New daily persistent headache (NDPH): Secondary | ICD-10-CM | POA: Diagnosis not present

## 2023-08-30 DIAGNOSIS — E269 Hyperaldosteronism, unspecified: Secondary | ICD-10-CM

## 2023-08-30 DIAGNOSIS — R0609 Other forms of dyspnea: Secondary | ICD-10-CM | POA: Diagnosis not present

## 2023-08-30 DIAGNOSIS — I119 Hypertensive heart disease without heart failure: Secondary | ICD-10-CM

## 2023-08-30 HISTORY — DX: Other chest pain: R07.89

## 2023-08-30 HISTORY — DX: Shortness of breath: R06.02

## 2023-08-30 LAB — CBC WITH DIFFERENTIAL/PLATELET
Basophils Absolute: 0 10*3/uL (ref 0.0–0.1)
Basophils Relative: 0.1 % (ref 0.0–3.0)
Eosinophils Absolute: 0 10*3/uL (ref 0.0–0.7)
Eosinophils Relative: 0 % (ref 0.0–5.0)
HCT: 43.3 % (ref 36.0–46.0)
Hemoglobin: 14 g/dL (ref 12.0–15.0)
Lymphocytes Relative: 29.1 % (ref 12.0–46.0)
Lymphs Abs: 1.4 10*3/uL (ref 0.7–4.0)
MCHC: 32.3 g/dL (ref 30.0–36.0)
MCV: 91.1 fL (ref 78.0–100.0)
Monocytes Absolute: 0.4 10*3/uL (ref 0.1–1.0)
Monocytes Relative: 9.1 % (ref 3.0–12.0)
Neutro Abs: 2.9 10*3/uL (ref 1.4–7.7)
Neutrophils Relative %: 61.7 % (ref 43.0–77.0)
Platelets: 160 10*3/uL (ref 150.0–400.0)
RBC: 4.75 Mil/uL (ref 3.87–5.11)
RDW: 14.1 % (ref 11.5–15.5)
WBC: 4.8 10*3/uL (ref 4.0–10.5)

## 2023-08-30 LAB — HEPATIC FUNCTION PANEL
ALT: 17 U/L (ref 0–35)
AST: 24 U/L (ref 0–37)
Albumin: 4 g/dL (ref 3.5–5.2)
Alkaline Phosphatase: 78 U/L (ref 39–117)
Bilirubin, Direct: 0.1 mg/dL (ref 0.0–0.3)
Total Bilirubin: 0.5 mg/dL (ref 0.2–1.2)
Total Protein: 6.8 g/dL (ref 6.0–8.3)

## 2023-08-30 LAB — LIPID PANEL
Cholesterol: 130 mg/dL (ref 0–200)
HDL: 52.1 mg/dL (ref >39.00)
LDL Cholesterol: 66 mg/dL (ref 0–99)
NonHDL: 78.02
Total CHOL/HDL Ratio: 2
Triglycerides: 62 mg/dL (ref 0.0–149.0)
VLDL: 12.4 mg/dL (ref 0.0–40.0)

## 2023-08-30 LAB — BASIC METABOLIC PANEL
BUN: 19 mg/dL (ref 6–23)
CO2: 26 meq/L (ref 19–32)
Calcium: 9.3 mg/dL (ref 8.4–10.5)
Chloride: 107 meq/L (ref 96–112)
Creatinine, Ser: 1.08 mg/dL (ref 0.40–1.20)
GFR: 52.28 mL/min — ABNORMAL LOW (ref >60.00)
Glucose, Bld: 88 mg/dL (ref 70–99)
Potassium: 4.5 meq/L (ref 3.5–5.1)
Sodium: 140 meq/L (ref 135–145)

## 2023-08-30 LAB — URINALYSIS, ROUTINE W REFLEX MICROSCOPIC
Hgb urine dipstick: NEGATIVE
Ketones, ur: NEGATIVE
Nitrite: NEGATIVE
Specific Gravity, Urine: 1.025 (ref 1.000–1.030)
Total Protein, Urine: NEGATIVE
Urine Glucose: NEGATIVE
Urobilinogen, UA: 1 (ref 0.0–1.0)
pH: 6 (ref 5.0–8.0)

## 2023-08-30 LAB — TROPONIN I (HIGH SENSITIVITY): High Sens Troponin I: 3 ng/L (ref 2–17)

## 2023-08-30 LAB — BRAIN NATRIURETIC PEPTIDE: Pro B Natriuretic peptide (BNP): 17 pg/mL (ref 0.0–100.0)

## 2023-08-30 MED ORDER — METOPROLOL SUCCINATE ER 25 MG PO TB24
25.0000 mg | ORAL_TABLET | Freq: Every evening | ORAL | 0 refills | Status: DC
Start: 2023-08-30 — End: 2023-11-15

## 2023-08-30 MED ORDER — OLMESARTAN MEDOXOMIL 40 MG PO TABS: 40.0000 mg | ORAL_TABLET | Freq: Every day | ORAL | 0 refills | Status: DC

## 2023-08-30 MED ORDER — SPIRONOLACTONE 50 MG PO TABS: 50.0000 mg | ORAL_TABLET | Freq: Every day | ORAL | 0 refills | Status: DC

## 2023-08-30 NOTE — Patient Instructions (Signed)
Hypertension, Adult High blood pressure (hypertension) is when the force of blood pumping through the arteries is too strong. The arteries are the blood vessels that carry blood from the heart throughout the body. Hypertension forces the heart to work harder to pump blood and may cause arteries to become narrow or stiff. Untreated or uncontrolled hypertension can lead to a heart attack, heart failure, a stroke, kidney disease, and other problems. A blood pressure reading consists of a higher number over a lower number. Ideally, your blood pressure should be below 120/80. The first ("top") number is called the systolic pressure. It is a measure of the pressure in your arteries as your heart beats. The second ("bottom") number is called the diastolic pressure. It is a measure of the pressure in your arteries as the heart relaxes. What are the causes? The exact cause of this condition is not known. There are some conditions that result in high blood pressure. What increases the risk? Certain factors may make you more likely to develop high blood pressure. Some of these risk factors are under your control, including: Smoking. Not getting enough exercise or physical activity. Being overweight. Having too much fat, sugar, calories, or salt (sodium) in your diet. Drinking too much alcohol. Other risk factors include: Having a personal history of heart disease, diabetes, high cholesterol, or kidney disease. Stress. Having a family history of high blood pressure and high cholesterol. Having obstructive sleep apnea. Age. The risk increases with age. What are the signs or symptoms? High blood pressure may not cause symptoms. Very high blood pressure (hypertensive crisis) may cause: Headache. Fast or irregular heartbeats (palpitations). Shortness of breath. Nosebleed. Nausea and vomiting. Vision changes. Severe chest pain, dizziness, and seizures. How is this diagnosed? This condition is diagnosed by  measuring your blood pressure while you are seated, with your arm resting on a flat surface, your legs uncrossed, and your feet flat on the floor. The cuff of the blood pressure monitor will be placed directly against the skin of your upper arm at the level of your heart. Blood pressure should be measured at least twice using the same arm. Certain conditions can cause a difference in blood pressure between your right and left arms. If you have a high blood pressure reading during one visit or you have normal blood pressure with other risk factors, you may be asked to: Return on a different day to have your blood pressure checked again. Monitor your blood pressure at home for 1 week or longer. If you are diagnosed with hypertension, you may have other blood or imaging tests to help your health care provider understand your overall risk for other conditions. How is this treated? This condition is treated by making healthy lifestyle changes, such as eating healthy foods, exercising more, and reducing your alcohol intake. You may be referred for counseling on a healthy diet and physical activity. Your health care provider may prescribe medicine if lifestyle changes are not enough to get your blood pressure under control and if: Your systolic blood pressure is above 130. Your diastolic blood pressure is above 80. Your personal target blood pressure may vary depending on your medical conditions, your age, and other factors. Follow these instructions at home: Eating and drinking  Eat a diet that is high in fiber and potassium, and low in sodium, added sugar, and fat. An example of this eating plan is called the DASH diet. DASH stands for Dietary Approaches to Stop Hypertension. To eat this way: Eat   plenty of fresh fruits and vegetables. Try to fill one half of your plate at each meal with fruits and vegetables. Eat whole grains, such as whole-wheat pasta, brown rice, or whole-grain bread. Fill about one  fourth of your plate with whole grains. Eat or drink low-fat dairy products, such as skim milk or low-fat yogurt. Avoid fatty cuts of meat, processed or cured meats, and poultry with skin. Fill about one fourth of your plate with lean proteins, such as fish, chicken without skin, beans, eggs, or tofu. Avoid pre-made and processed foods. These tend to be higher in sodium, added sugar, and fat. Reduce your daily sodium intake. Many people with hypertension should eat less than 1,500 mg of sodium a day. Do not drink alcohol if: Your health care provider tells you not to drink. You are pregnant, may be pregnant, or are planning to become pregnant. If you drink alcohol: Limit how much you have to: 0-1 drink a day for women. 0-2 drinks a day for men. Know how much alcohol is in your drink. In the [place] one drink equals one 12 oz bottle of beer (355 mL), one 5 oz glass of wine (148 mL), or one 1 oz glass of hard liquor (44 mL). Lifestyle  Work with your health care provider to maintain a healthy body weight or to lose weight. Ask what an ideal weight is for you. Get at least 30 minutes of exercise that causes your heart to beat faster (aerobic exercise) most days of the week. Activities may include walking, swimming, or biking. Include exercise to strengthen your muscles (resistance exercise), such as Pilates or lifting weights, as part of your weekly exercise routine. Try to do these types of exercises for 30 minutes at least 3 days a week. Do not use any products that contain nicotine or tobacco. These products include cigarettes, chewing tobacco, and vaping devices, such as e-cigarettes. If you need help quitting, ask your health care provider. Monitor your blood pressure at home as told by your health care provider. Keep all follow-up visits. This is important. Medicines Take over-the-counter and prescription medicines only as told by your health care provider. Follow directions carefully. Blood  pressure medicines must be taken as prescribed. Do not skip doses of blood pressure medicine. Doing this puts you at risk for problems and can make the medicine less effective. Ask your health care provider about side effects or reactions to medicines that you should watch for. Contact a health care provider if you: Think you are having a reaction to a medicine you are taking. Have headaches that keep coming back (recurring). Feel dizzy. Have swelling in your ankles. Have trouble with your vision. Get help right away if you: Develop a severe headache or confusion. Have unusual weakness or numbness. Feel faint. Have severe pain in your chest or abdomen. Vomit repeatedly. Have trouble breathing. These symptoms may be an emergency. Get help right away. Call 911. Do not wait to see if the symptoms will go away. Do not drive yourself to the hospital. Summary Hypertension is when the force of blood pumping through your arteries is too strong. If this condition is not controlled, it may put you at risk for serious complications. Your personal target blood pressure may vary depending on your medical conditions, your age, and other factors. For most people, a normal blood pressure is less than 120/80. Hypertension is treated with lifestyle changes, medicines, or a combination of both. Lifestyle changes include losing weight, eating a healthy,   low-sodium diet, exercising more, and limiting alcohol. This information is not intended to replace advice given to you by your health care provider. Make sure you discuss any questions you have with your health care provider. Document Revised: 09/02/2021 Document Reviewed: 09/02/2021 Elsevier Patient Education  2024 Elsevier Inc.  

## 2023-08-30 NOTE — Progress Notes (Signed)
Subjective:  Patient ID: Kelli Contreras, female    DOB: [birth year]  Age: 72 y.o. MRN: [id number]  CC: Hypertension and Headache   HPI Kelli Contreras presents for f/up -----  Discussed the use of AI scribe software for clinical note transcription with the patient, who gave verbal consent to proceed.  History of Present Illness   The patient has been experiencing a persistent headache for the past two weeks, described as an aching pain at the back of the head and neck, and a throbbing pain on the left side of the face. The headache is reported to be different from their usual migraines. Over-the-counter Excedrin Migraine has been used for symptomatic relief, but the headache returns once the medication wears off. Accompanying the headache, the patient has experienced nausea and vomiting twice in the past week.  In addition to the headache, the patient reports chest pain and shortness of breath. The chest pain is described as hurting, but the patient is unsure if it is related to indigestion. The shortness of breath was particularly noticeable when the patient had to stand in line to vote and had to walk a little distance. There is no reported swelling in the legs or feet, and no slurred speech.  The patient has been taking their prescribed medication, including a blood pressure medication. However, they have also been taking Excedrin Migraine, which contains caffeine. The patient's blood pressure readings during the consultation were high. The patient was previously prescribed metoprolol in March but ran out a week ago.       Outpatient Medications Prior to Visit  Medication Sig Dispense Refill   aspirin 81 MG tablet Take 81 mg by mouth at bedtime.      atorvastatin (LIPITOR) 20 MG tablet TAKE 1 TABLET EVERY DAY 90 tablet 0   DULoxetine (CYMBALTA) 30 MG capsule TAKE 1 CAPSULE EVERY DAY 90 capsule 0   hydrALAZINE (APRESOLINE) 25 MG tablet Take 1 tablet (25 mg total) by mouth 3 (three) times  daily. 270 tablet 2   imipramine (TOFRANIL) 50 MG tablet Take 2 tablets (100 mg total) by mouth at bedtime. 200 tablet 1   pantoprazole (PROTONIX) 40 MG tablet TAKE 1 TABLET EVERY DAY 90 tablet 0   SUMAtriptan (IMITREX) 100 MG tablet Take 1/2 tab (50mg ) at onset of migraine. May take other half tab in 2 hours if headache persists or recurs. 9 tablet 4   topiramate (TOPAMAX) 50 MG tablet TAKE 1 TABLET TWICE DAILY 180 tablet 0   zolpidem (AMBIEN) 5 MG tablet Take 1 tablet (5 mg total) by mouth at bedtime as needed. (Patient taking differently: Take 5 mg by mouth at bedtime as needed for sleep.) 60 tablet 0   metoprolol succinate (TOPROL-XL) 25 MG 24 hr tablet Take 1 tablet (25 mg total) by mouth at bedtime. Annual appt due in July must see provider for future refills 90 tablet 1   olmesartan (BENICAR) 40 MG tablet TAKE 1 TABLET EVERY DAY 90 tablet 0   spironolactone (ALDACTONE) 25 MG tablet Take 1 tablet (25 mg total) by mouth daily. 90 tablet 0   No facility-administered medications prior to visit.    ROS Review of Systems  Constitutional:  Negative for appetite change, chills, diaphoresis, fatigue and fever.  HENT: Negative.  Negative for trouble swallowing.   Eyes:  Negative for visual disturbance.  Respiratory:  Positive for shortness of breath. Negative for cough, choking, chest tightness and wheezing.   Cardiovascular:  Positive for chest  pain. Negative for palpitations and leg swelling.  Gastrointestinal:  Positive for nausea and vomiting. Negative for abdominal pain and diarrhea.  Genitourinary: Negative.  Negative for difficulty urinating.  Musculoskeletal:  Positive for gait problem. Negative for arthralgias, back pain and neck stiffness.  Skin: Negative.   Neurological:  Positive for headaches. Negative for dizziness, weakness and light-headedness.  Hematological:  Negative for adenopathy. Does not bruise/bleed easily.  Psychiatric/Behavioral:  Positive for confusion and decreased  concentration. Negative for behavioral problems. The patient is not nervous/anxious.     Objective:  BP (!) 160/96   Pulse 91   Temp 97.7 F (36.5 C) (Temporal)   Resp 16   Ht 5\' 2"  (1.575 m)   Wt 175 lb 2 oz (79.4 kg)   SpO2 96%   BMI 32.03 kg/m   BP Readings from Last 3 Encounters:  08/30/23 (!) 160/96  02/24/23 118/80  02/24/23 136/86    Wt Readings from Last 3 Encounters:  08/30/23 175 lb 2 oz (79.4 kg)  02/24/23 174 lb (78.9 kg)  02/24/23 174 lb (78.9 kg)    Physical Exam Vitals reviewed.  Constitutional:      Appearance: Normal appearance.  HENT:     Mouth/Throat:     Mouth: Mucous membranes are moist.  Eyes:     General: No scleral icterus.    Extraocular Movements: Extraocular movements intact.     Pupils: Pupils are equal, round, and reactive to light.  Cardiovascular:     Rate and Rhythm: Normal rate and regular rhythm.     Pulses: Normal pulses.     Heart sounds: No murmur heard.    No friction rub. No gallop.     Comments: EKG- NSR, 84 bpm No LVH, Q waves, or ST/T waves  Pulmonary:     Effort: Pulmonary effort is normal.     Breath sounds: No stridor. No wheezing, rhonchi or rales.  Abdominal:     General: Abdomen is flat.     Palpations: There is no mass.     Tenderness: There is no abdominal tenderness. There is no guarding.     Hernia: No hernia is present.  Musculoskeletal:        General: Normal range of motion.     Cervical back: Neck supple.     Right lower leg: No edema.     Left lower leg: No edema.  Lymphadenopathy:     Cervical: No cervical adenopathy.  Skin:    General: Skin is warm and dry.     Coloration: Skin is not pale.  Neurological:     General: No focal deficit present.     Mental Status: She is alert. Mental status is at baseline.  Psychiatric:        Mood and Affect: Mood normal.        Behavior: Behavior normal.     Lab Results  Component Value Date   WBC 4.8 08/30/2023   HGB 14.0 08/30/2023   HCT 43.3  08/30/2023   PLT 160.0 08/30/2023   GLUCOSE 88 08/30/2023   CHOL 130 08/30/2023   TRIG 62.0 08/30/2023   HDL 52.10 08/30/2023   LDLCALC 66 08/30/2023   ALT 17 08/30/2023   AST 24 08/30/2023   NA 140 08/30/2023   K 4.5 08/30/2023   CL 107 08/30/2023   CREATININE 1.08 08/30/2023   BUN 19 08/30/2023   CO2 26 08/30/2023   TSH 1.42 02/24/2023   INR 1.0 06/03/2022   HGBA1C 5.4 02/18/2015  MM 3D SCREENING MAMMOGRAM BILATERAL BREAST  Result Date: 06/29/2023 CLINICAL DATA:  Screening. EXAM: DIGITAL SCREENING BILATERAL MAMMOGRAM WITH TOMOSYNTHESIS AND CAD TECHNIQUE: Bilateral screening digital craniocaudal and mediolateral oblique mammograms were obtained. Bilateral screening digital breast tomosynthesis was performed. The images were evaluated with computer-aided detection. COMPARISON:  Previous exam(s). ACR Breast Density Category c: The breasts are heterogeneously dense, which may obscure small masses. FINDINGS: There are no findings suspicious for malignancy. IMPRESSION: No mammographic evidence of malignancy. A result letter of this screening mammogram will be mailed directly to the patient. RECOMMENDATION: Screening mammogram in one year. (Code:SM-B-01Y) BI-RADS CATEGORY  1: Negative. Electronically Signed   By: Frederico Hamman M.D.   On: 06/29/2023 16:45    Assessment & Plan:  LVH (left ventricular hypertrophy) due to hypertensive disease, without heart failure -     Metoprolol Succinate ER; Take 1 tablet (25 mg total) by mouth at bedtime. Annual appt due in July must see provider for future refills  Dispense: 90 tablet; Refill: 0 -     AMB Referral VBCI Care Management  Chest pressure -     Brain natriuretic peptide; Future -     Troponin I (High Sensitivity); Future -     EKG 12-Lead  DOE (dyspnea on exertion) -     Brain natriuretic peptide; Future -     Troponin I (High Sensitivity); Future -     EKG 12-Lead  Primary hypertension- Her blood pressure is not at goal and she  is symptomatic.  Will restart the beta-blocker and the ARB.  Will increase the dose of spironolactone. -     Metoprolol Succinate ER; Take 1 tablet (25 mg total) by mouth at bedtime. Annual appt due in July must see provider for future refills  Dispense: 90 tablet; Refill: 0 -     AMB Referral VBCI Care Management -     CBC with Differential/Platelet; Future -     Urinalysis, Routine w reflex microscopic; Future -     Spironolactone; Take 1 tablet (50 mg total) by mouth daily.  Dispense: 30 tablet; Refill: 0 -     Olmesartan Medoxomil; Take 1 tablet (40 mg total) by mouth daily.  Dispense: 90 tablet; Refill: 0  Hyperaldosteronism (HCC) -     Basic metabolic panel; Future -     Spironolactone; Take 1 tablet (50 mg total) by mouth daily.  Dispense: 30 tablet; Refill: 0  Grade I diastolic dysfunction -     Brain natriuretic peptide; Future -     Troponin I (High Sensitivity); Future  Hyperlipidemia with target LDL less than 130 -     Lipid panel; Future -     Hepatic function panel; Future  Stage 3a chronic kidney disease (HCC)- Her renal function has declined.  Will try to get better control of her blood pressure. -     Basic metabolic panel; Future -     Urinalysis, Routine w reflex microscopic; Future  New daily persistent headache- Will evaluate for mass, bleed, ICH, NPH, CVA. -     CT HEAD WO CONTRAST ( ); Future     Follow-up: Return in about 3 weeks (around 09/20/2023).  Sanda Linger, MD

## 2023-09-01 ENCOUNTER — Telehealth: Payer: Self-pay

## 2023-09-01 ENCOUNTER — Inpatient Hospital Stay
Admission: RE | Admit: 2023-09-01 | Discharge: 2023-09-01 | Discharge status: Home / Self Care | Payer: Medicare PPO | Source: Ambulatory Visit | Attending: Internal Medicine | Admitting: Internal Medicine

## 2023-09-01 DIAGNOSIS — R519 Headache, unspecified: Secondary | ICD-10-CM | POA: Diagnosis not present

## 2023-09-01 DIAGNOSIS — G4452 New daily persistent headache (NDPH): Secondary | ICD-10-CM

## 2023-09-01 NOTE — Progress Notes (Signed)
   Care Guide Note  09/01/2023 Name: CASYN HEES MRN: [id number] DOB: Aug 31, 1951  Referred by: Etta Grandchild, MD Reason for referral : Care Coordination (Outreach to schedule with Pharm d )   Kelli Contreras is a 73 y.o. year old female who is a primary care patient of Etta Grandchild, MD. Gerre Pebbles was referred to the pharmacist for assistance related to HTN.    Successful contact was made with the patient to discuss pharmacy services including being ready for the pharmacist to call at least 5 minutes before the scheduled appointment time, to have medication bottles and any blood sugar or blood pressure readings ready for review. The patient agreed to meet with the pharmacist via with the pharmacist via telephone visit on (date/time).  09/08/2023  Penne Lash, RMA Care Guide [facility]  [place], [place] [address] Direct Dial: [phone] [email or web address]

## 2023-09-08 ENCOUNTER — Other Ambulatory Visit: Payer: Medicare PPO | Admitting: Pharmacist

## 2023-09-08 NOTE — Patient Instructions (Signed)
It was a pleasure speaking with you today!  Increase your spironolactone to 50 mg daily and monitor your blood pressure daily until our next call scheduled for 09/20/23. We will review your BP readings at that time.  Feel free to call with any questions or concerns!  Arbutus Leas, PharmD, BCPS [place] [facility] Clinical Pharmacist [facility] [phone]

## 2023-09-08 NOTE — Progress Notes (Signed)
09/08/2023 Name: Kelli Contreras MRN: [id number] DOB: 31-Oct-1951  Chief Complaint  Patient presents with   Hypertension   Medication Management    Leather CHAKARA IRUEGAS is a 72 y.o. year old female who presented for a telephone visit.   They were referred to the pharmacist by their PCP for assistance in managing hypertension.   Subjective:  Care Team: Primary Care Provider: Etta Grandchild, MD ; Next Scheduled Visit: none scheduled  Medication Access/Adherence  Current Pharmacy:  [id number] - [place], [place] - [address] [place] [address] Phone: [phone] Fax: [phone]  [facility] - [place], [place] - [address] [place] [address] Phone: [phone] Fax: [phone]   Patient reports affordability concerns with their medications: No  Patient reports access/transportation concerns to their pharmacy: No  Patient reports adherence concerns with their medications:  No     Hypertension:  Current medications: hydralazine 25 mg TID, metoprolol succinate 25 mg daily, olmesartan 40 mg daily, spironolactone 50 mg daily (has not increased yet, taking 25 mg) Medications previously tried:   Patient has a validated, automated, upper arm home BP cuff Current blood pressure readings readings: none recent  Patient reports hypotensive s/sx including occasional dizziness, lightheadedness. Reports fatigue. Patient reports hypertensive symptoms including chest pain, shortness of breath with exertion   Objective: BP Readings from Last 3 Encounters:  08/30/23 (!) 160/96  02/24/23 118/80  02/24/23 136/86     Lab Results  Component Value Date   HGBA1C 5.4 02/18/2015    Lab Results  Component Value Date   CREATININE 1.08 08/30/2023   BUN 19 08/30/2023   NA 140 08/30/2023   K 4.5 08/30/2023   CL 107 08/30/2023   CO2 26 08/30/2023    Lab Results  Component Value Date   CHOL 130  08/30/2023   HDL 52.10 08/30/2023   LDLCALC 66 08/30/2023   TRIG 62.0 08/30/2023   CHOLHDL 2 08/30/2023    Medications Reviewed Today     Reviewed by Bonita Quin, RPH (Pharmacist) on 09/08/23 at 1134  Med List Status: <None>   Medication Order Taking? Sig Documenting Provider Last Dose Status Informant  aspirin 81 MG tablet [id number] Yes Take 81 mg by mouth at bedtime.  [provider] Taking Active Self           Med Note Katrinka Blazing, JEFFREY W   Wed Feb 28, 2020 12:55 AM)    atorvastatin (LIPITOR) 20 MG tablet [id number] Yes TAKE 1 TABLET EVERY DAY Etta Grandchild, MD Taking Active   DULoxetine (CYMBALTA) 30 MG capsule [id number] Yes TAKE 1 CAPSULE EVERY DAY Etta Grandchild, MD Taking Active   hydrALAZINE (APRESOLINE) 25 MG tablet [id number] Yes Take 1 tablet (25 mg total) by mouth 3 (three) times daily. Etta Grandchild, MD Taking Active   imipramine (TOFRANIL) 50 MG tablet [id number] Yes Take 2 tablets (100 mg total) by mouth at bedtime. Etta Grandchild, MD Taking Active   metoprolol succinate (TOPROL-XL) 25 MG 24 hr tablet [id number] Yes Take 1 tablet (25 mg total) by mouth at bedtime. Annual appt due in July must see provider for future refills Etta Grandchild, MD Taking Active   olmesartan (BENICAR) 40 MG tablet [id number] Yes Take 1 tablet (40 mg total) by mouth daily. Etta Grandchild, MD Taking Active   pantoprazole (PROTONIX) 40 MG tablet [id number] Yes TAKE 1 TABLET EVERY DAY Etta Grandchild, MD  Taking Active   spironolactone (ALDACTONE) 50 MG tablet [id number] Yes Take 1 tablet (50 mg total) by mouth daily. Etta Grandchild, MD Taking Active   SUMAtriptan (IMITREX) 100 MG tablet [id number] Yes Take 1/2 tab (50mg ) at onset of migraine. May take other half tab in 2 hours if headache persists or recurs. Lezlie Lye, Meda Coffee, MD Taking Active   topiramate (TOPAMAX) 50 MG tablet [id number] Yes TAKE 1 TABLET TWICE DAILY Etta Grandchild, MD Taking Active   zolpidem (AMBIEN) 5 MG tablet  [id number] No Take 1 tablet (5 mg total) by mouth at bedtime as needed.  Patient not taking: Reported on 09/08/2023   Ethelda Chick, MD Not Taking Active               Assessment/Plan:   Hypertension: - Currently uncontrolled, BP goal <130/80 - Reviewed long term cardiovascular and renal outcomes of uncontrolled blood pressure - Reviewed appropriate blood pressure monitoring technique and reviewed goal blood pressure. Recommended to check home blood pressure and heart rate daily - Recommend to increase spironolactone to 50 mg daily - Will call in 1.5 weeks for home BP readings and scheduled for a 1 month in office BP check. She will also bring her BP monitor to calibrate in office     Follow Up Plan: 11/11 telephone f/u, 11/25 BP check OV  Arbutus Leas, PharmD, BCPS Clinical Pharmacist [facility] [phone]

## 2023-09-10 ENCOUNTER — Other Ambulatory Visit: Payer: Medicare PPO

## 2023-09-10 ENCOUNTER — Other Ambulatory Visit: Payer: Self-pay | Admitting: Internal Medicine

## 2023-09-10 DIAGNOSIS — Z13820 Encounter for screening for osteoporosis: Secondary | ICD-10-CM

## 2023-09-20 ENCOUNTER — Other Ambulatory Visit: Payer: Medicare PPO | Admitting: Pharmacist

## 2023-09-20 NOTE — Patient Instructions (Signed)
It was a pleasure speaking with you today!  Please be sure to check blood pressures at home. I will give you a call 11/25 to go over home BP readings.  Feel free to call with any questions or concerns!  Arbutus Leas, PharmD, BCPS [place] [facility] Clinical Pharmacist [facility] [phone]

## 2023-09-20 NOTE — Progress Notes (Signed)
   09/20/2023 Name: Kelli Contreras MRN: [id number] DOB: 1950-12-11  Chief Complaint  Patient presents with   Hypertension   Medication Management    Kelli Contreras is a 72 y.o. year old female who presented for a telephone visit.   They were referred to the pharmacist by their PCP for assistance in managing hypertension.   Subjective:  Care Team: Primary Care Provider: Etta Grandchild, MD ; Next Scheduled Visit: none scheduled  Medication Access/Adherence  Current Pharmacy:  [id number] - [place], [place] - [address] [facility] [place] [address] Phone: [phone] Fax: [phone]  [facility] - [place], [place] - [address] [place] [address] Phone: [phone] Fax: [phone]   Patient reports affordability concerns with their medications: No  Patient reports access/transportation concerns to their pharmacy: No  Patient reports adherence concerns with their medications:  No     Hypertension:  Current medications: hydralazine 25 mg TID, metoprolol succinate 25 mg daily, olmesartan 40 mg daily, spironolactone 50 mg daily (started ~10/31) Medications previously tried:   Patient has a validated, automated, upper arm home BP cuff Current blood pressure readings readings: none recent  Patient reports she has been feeling well since last call however she has not checked BP.   Objective: BP Readings from Last 3 Encounters:  08/30/23 (!) 160/96  02/24/23 118/80  02/24/23 136/86     Lab Results  Component Value Date   HGBA1C 5.4 02/18/2015    Lab Results  Component Value Date   CREATININE 1.08 08/30/2023   BUN 19 08/30/2023   NA 140 08/30/2023   K 4.5 08/30/2023   CL 107 08/30/2023   CO2 26 08/30/2023    Lab Results  Component Value Date   CHOL 130 08/30/2023   HDL 52.10 08/30/2023   LDLCALC 66 08/30/2023   TRIG 62.0 08/30/2023   CHOLHDL 2 08/30/2023    Medications  Reviewed Today   Medications were not reviewed in this encounter       Assessment/Plan:   Hypertension: - Currently uncontrolled, BP goal <130/80 - Reviewed long term cardiovascular and renal outcomes of uncontrolled blood pressure - Reviewed appropriate blood pressure monitoring technique and reviewed goal blood pressure. Recommended to check home blood pressure and heart rate daily - Pt has appt with PCP scheduled for 12/10. She does not want to come 11/25 and 12/10 therefore asked her to be sure to check BP and will call on 11/25, keep 12/10 appt with PCP    Follow Up Plan: 11/25 BP f/u  Arbutus Leas, PharmD, BCPS Clinical Pharmacist  Primary Care at [facility] [phone]

## 2023-09-21 ENCOUNTER — Other Ambulatory Visit: Payer: Self-pay | Admitting: Internal Medicine

## 2023-09-21 DIAGNOSIS — E269 Hyperaldosteronism, unspecified: Secondary | ICD-10-CM

## 2023-09-21 DIAGNOSIS — I10 Essential (primary) hypertension: Secondary | ICD-10-CM

## 2023-10-04 ENCOUNTER — Other Ambulatory Visit: Payer: Medicare PPO | Admitting: Pharmacist

## 2023-10-04 VITALS — BP 119/75

## 2023-10-04 DIAGNOSIS — I10 Essential (primary) hypertension: Secondary | ICD-10-CM

## 2023-10-04 NOTE — Patient Instructions (Signed)
It was a pleasure speaking with you today!  Your blood pressure is doing much better. Please let us know if you begin experiencing any unusual or new lightheadedness or dizziness.  Feel free to call with any questions or concerns!  Arbutus Leas, PharmD, BCPS [facility] Clinical Pharmacist [facility] [phone]

## 2023-10-04 NOTE — Progress Notes (Signed)
10/04/2023 Name: Kelli Contreras MRN: [id number] DOB: [birth year]  Chief Complaint  Patient presents with   Hypertension   Medication Management    Kelli Contreras is a 72 y.o. year old female who presented for a telephone visit.   They were referred to the pharmacist by their PCP for assistance in managing hypertension.    Subjective:  Care Team: Primary Care Provider: Etta Grandchild, MD ; Next Scheduled Visit: none scheduled  Medication Access/Adherence  Current Pharmacy:  [id number] - [place], [place] - [address] [place] [address] Phone: [phone] Fax: [phone]  [facility] - [place], [place] - [address] [place] [address] Phone: [phone] Fax: [phone]   Patient reports affordability concerns with their medications: No  Patient reports access/transportation concerns to their pharmacy: No  Patient reports adherence concerns with their medications:  No     Hypertension:  Current medications: hydralazine 25 mg TID, metoprolol succinate 25 mg daily, olmesartan 40 mg daily, spironolactone 50 mg daily (started ~10/31, increased from 25 mg) Medications previously tried:   Patient has a validated, automated, upper arm home BP cuff Current blood pressure readings: 132/89, 116/80, 119/76, 118/76, 119/75  Patient denies s/sx hypotension such as dizziness or lightheadedness. Patient denies SOB or chest pain.   Objective: BP Readings from Last 3 Encounters:  10/04/23 119/75  08/30/23 (!) 160/96  02/24/23 118/80     Lab Results  Component Value Date   HGBA1C 5.4 02/18/2015    Lab Results  Component Value Date   CREATININE 1.08 08/30/2023   BUN 19 08/30/2023   NA 140 08/30/2023   K 4.5 08/30/2023   CL 107 08/30/2023   CO2 26 08/30/2023    Lab Results  Component Value Date   CHOL 130 08/30/2023   HDL 52.10 08/30/2023   LDLCALC 66 08/30/2023   TRIG 62.0  08/30/2023   CHOLHDL 2 08/30/2023    Medications Reviewed Today     Reviewed by Bonita Quin, RPH (Pharmacist) on 10/04/23 at 1106  Med List Status: <None>   Medication Order Taking? Sig Documenting Provider Last Dose Status Informant  aspirin 81 MG tablet [id number]  Take 81 mg by mouth at bedtime.  [provider]  Active Self           Med Note Katrinka Blazing, JEFFREY W   Wed Feb 28, 2020 12:55 AM)    atorvastatin (LIPITOR) 20 MG tablet [id number]  TAKE 1 TABLET EVERY DAY Etta Grandchild, MD  Active   DULoxetine (CYMBALTA) 30 MG capsule [id number]  TAKE 1 CAPSULE EVERY DAY Etta Grandchild, MD  Active   hydrALAZINE (APRESOLINE) 25 MG tablet [id number]  Take 1 tablet (25 mg total) by mouth 3 (three) times daily. Etta Grandchild, MD  Active   imipramine (TOFRANIL) 50 MG tablet [id number]  Take 2 tablets (100 mg total) by mouth at bedtime. Etta Grandchild, MD  Active   metoprolol succinate (TOPROL-XL) 25 MG 24 hr tablet [id number]  Take 1 tablet (25 mg total) by mouth at bedtime. Annual appt due in July must see provider for future refills Etta Grandchild, MD  Active   olmesartan (BENICAR) 40 MG tablet [id number]  Take 1 tablet (40 mg total) by mouth daily. Etta Grandchild, MD  Active   pantoprazole (PROTONIX) 40 MG tablet [id number]  TAKE 1 TABLET EVERY DAY Etta Grandchild, MD  Active   spironolactone (  ALDACTONE) 50 MG tablet [id number] Yes TAKE 1 TABLET BY MOUTH EVERY DAY Etta Grandchild, MD Taking Active   SUMAtriptan (IMITREX) 100 MG tablet [id number]  Take 1/2 tab (50mg ) at onset of migraine. May take other half tab in 2 hours if headache persists or recurs. Lezlie Lye, Meda Coffee, MD  Active   topiramate (TOPAMAX) 50 MG tablet [id number]  TAKE 1 TABLET TWICE DAILY Etta Grandchild, MD  Active   zolpidem (AMBIEN) 5 MG tablet [id number]  Take 1 tablet (5 mg total) by mouth at bedtime as needed.  Patient not taking: Reported on 09/08/2023   Ethelda Chick, MD  Active                Assessment/Plan:   Hypertension: - Currently controlled, BP goal <130/80 - BP much improved s/p increased spironolactone - Reviewed appropriate blood pressure monitoring technique and reviewed goal blood pressure. Recommended to check home blood pressure and heart rate daily - Pt has appt with PCP scheduled for 12/10. Recommend getting BMP due to recent increased dose of spironolactone - order entered    Follow Up Plan: PRN  Arbutus Leas, PharmD, BCPS Clinical Pharmacist [facility] [phone]

## 2023-10-19 ENCOUNTER — Encounter: Payer: Self-pay | Admitting: Internal Medicine

## 2023-10-19 ENCOUNTER — Ambulatory Visit: Payer: Medicare PPO | Admitting: Internal Medicine

## 2023-10-19 VITALS — BP 84/60 | HR 92 | Temp 97.8°F | Ht 62.0 in | Wt 176.8 lb

## 2023-10-19 DIAGNOSIS — I950 Idiopathic hypotension: Secondary | ICD-10-CM

## 2023-10-19 DIAGNOSIS — N1832 Chronic kidney disease, stage 3b: Secondary | ICD-10-CM | POA: Diagnosis not present

## 2023-10-19 DIAGNOSIS — R0789 Other chest pain: Secondary | ICD-10-CM | POA: Diagnosis not present

## 2023-10-19 DIAGNOSIS — R0609 Other forms of dyspnea: Secondary | ICD-10-CM

## 2023-10-19 DIAGNOSIS — R0602 Shortness of breath: Secondary | ICD-10-CM

## 2023-10-19 DIAGNOSIS — R9431 Abnormal electrocardiogram [ECG] [EKG]: Secondary | ICD-10-CM

## 2023-10-19 DIAGNOSIS — R5382 Chronic fatigue, unspecified: Secondary | ICD-10-CM | POA: Diagnosis not present

## 2023-10-19 DIAGNOSIS — I959 Hypotension, unspecified: Secondary | ICD-10-CM | POA: Insufficient documentation

## 2023-10-19 HISTORY — DX: Idiopathic hypotension: I95.0

## 2023-10-19 HISTORY — DX: Chronic fatigue, unspecified: R53.82

## 2023-10-19 LAB — CBC WITH DIFFERENTIAL/PLATELET
Basophils Absolute: 0 10*3/uL (ref 0.0–0.1)
Basophils Relative: 0.2 % (ref 0.0–3.0)
Eosinophils Absolute: 0 10*3/uL (ref 0.0–0.7)
Eosinophils Relative: 0 % (ref 0.0–5.0)
HCT: 44.5 % (ref 36.0–46.0)
Hemoglobin: 14.8 g/dL (ref 12.0–15.0)
Lymphocytes Relative: 22.7 % (ref 12.0–46.0)
Lymphs Abs: 1.6 10*3/uL (ref 0.7–4.0)
MCHC: 33.3 g/dL (ref 30.0–36.0)
MCV: 91.6 fL (ref 78.0–100.0)
Monocytes Absolute: 0.7 10*3/uL (ref 0.1–1.0)
Monocytes Relative: 9.7 % (ref 3.0–12.0)
Neutro Abs: 4.7 10*3/uL (ref 1.4–7.7)
Neutrophils Relative %: 67.4 % (ref 43.0–77.0)
Platelets: 193 10*3/uL (ref 150.0–400.0)
RBC: 4.86 Mil/uL (ref 3.87–5.11)
RDW: 13.5 % (ref 11.5–15.5)
WBC: 6.9 10*3/uL (ref 4.0–10.5)

## 2023-10-19 LAB — HEPATIC FUNCTION PANEL
ALT: 17 U/L (ref 0–35)
AST: 21 U/L (ref 0–37)
Albumin: 4.2 g/dL (ref 3.5–5.2)
Alkaline Phosphatase: 97 U/L (ref 39–117)
Bilirubin, Direct: 0.1 mg/dL (ref 0.0–0.3)
Total Bilirubin: 0.5 mg/dL (ref 0.2–1.2)
Total Protein: 7 g/dL (ref 6.0–8.3)

## 2023-10-19 LAB — BASIC METABOLIC PANEL
BUN: 22 mg/dL (ref 6–23)
CO2: 28 meq/L (ref 19–32)
Calcium: 9.1 mg/dL (ref 8.4–10.5)
Chloride: 104 meq/L (ref 96–112)
Creatinine, Ser: 1.27 mg/dL — ABNORMAL HIGH (ref 0.40–1.20)
GFR: 43 mL/min — ABNORMAL LOW (ref >60.00)
Glucose, Bld: 68 mg/dL — ABNORMAL LOW (ref 70–99)
Potassium: 4 meq/L (ref 3.5–5.1)
Sodium: 140 meq/L (ref 135–145)

## 2023-10-19 LAB — TROPONIN I (HIGH SENSITIVITY): High Sens Troponin I: 4 ng/L (ref 2–17)

## 2023-10-19 LAB — CORTISOL: Cortisol, Plasma: 5.7 ug/dL

## 2023-10-19 LAB — D-DIMER, QUANTITATIVE: D-Dimer, Quant: 0.31 ug{FEU}/mL (ref <0.50)

## 2023-10-19 LAB — BRAIN NATRIURETIC PEPTIDE: Pro B Natriuretic peptide (BNP): 18 pg/mL (ref 0.0–100.0)

## 2023-10-19 NOTE — Progress Notes (Unsigned)
Subjective:  Patient ID: Kelli Contreras, female    DOB: [birth year]  Age: 72 y.o. MRN: [id number]  CC: Annual Exam   HPI Kelli Contreras presents for a CPX and f/up ---  Discussed the use of AI scribe software for clinical note transcription with the patient, who gave verbal consent to proceed.  History of Present Illness   The patient, with an unspecified medical history, presents with a 10-day history of worsening fatigue and shortness of breath. The symptoms were gradual in onset but have progressively worsened. The patient experienced an episode of extreme exhaustion after a short walk, which was unusual for her. She also reports a feeling of lightheadedness that started around the same time.  The patient was unaware of her low blood pressure until this consultation. She denies any episodes of syncope. There is a mention of mild chest discomfort, but the patient does not perceive it as significant chest pain.  In addition to the above, the patient reports new onset lower back pain, which she initially attributed to arthritis. She describes a generalized soreness all over her body. She denies any abdominal pain or hematuria.  The patient's medical history includes vaccinations for shingles but not for pneumonia. The patient was unaware of any previous diagnoses of heart attack or heart failure.       Outpatient Medications Prior to Visit  Medication Sig Dispense Refill   aspirin 81 MG tablet Take 81 mg by mouth at bedtime.      atorvastatin (LIPITOR) 20 MG tablet TAKE 1 TABLET EVERY DAY 90 tablet 0   DULoxetine (CYMBALTA) 30 MG capsule TAKE 1 CAPSULE EVERY DAY 90 capsule 0   imipramine (TOFRANIL) 50 MG tablet Take 2 tablets (100 mg total) by mouth at bedtime. 200 tablet 1   metoprolol succinate (TOPROL-XL) 25 MG 24 hr tablet Take 1 tablet (25 mg total) by mouth at bedtime. Annual appt due in July must see provider for future refills 90 tablet 0   pantoprazole (PROTONIX) 40 MG tablet  TAKE 1 TABLET EVERY DAY 90 tablet 0   spironolactone (ALDACTONE) 50 MG tablet TAKE 1 TABLET BY MOUTH EVERY DAY 90 tablet 0   SUMAtriptan (IMITREX) 100 MG tablet Take 1/2 tab (50mg ) at onset of migraine. May take other half tab in 2 hours if headache persists or recurs. 9 tablet 4   topiramate (TOPAMAX) 50 MG tablet TAKE 1 TABLET TWICE DAILY 180 tablet 0   hydrALAZINE (APRESOLINE) 25 MG tablet Take 1 tablet (25 mg total) by mouth 3 (three) times daily. 270 tablet 2   olmesartan (BENICAR) 40 MG tablet Take 1 tablet (40 mg total) by mouth daily. 90 tablet 0   promethazine-dextromethorphan (PROMETHAZINE-DM) 6.25-15 MG/5ML syrup Take 5 mLs by mouth every 4 (four) hours as needed.     zolpidem (AMBIEN) 5 MG tablet Take 1 tablet (5 mg total) by mouth at bedtime as needed. 60 tablet 0   No facility-administered medications prior to visit.    ROS Review of Systems  Constitutional:  Positive for fatigue. Negative for appetite change, chills, diaphoresis and unexpected weight change.  HENT:  Negative for trouble swallowing.   Respiratory:  Positive for chest tightness and shortness of breath. Negative for apnea, cough, choking and wheezing.   Cardiovascular:  Negative for chest pain, palpitations and leg swelling.  Gastrointestinal: Negative.  Negative for abdominal pain, constipation, diarrhea, nausea and vomiting.  Genitourinary: Negative.  Negative for difficulty urinating.  Musculoskeletal: Negative.  Negative for arthralgias  and myalgias.  Skin: Negative.   Neurological:  Positive for dizziness and light-headedness. Negative for syncope.  Hematological:  Negative for adenopathy. Does not bruise/bleed easily.  Psychiatric/Behavioral: Negative.      Objective:  BP (!) 84/60 (BP Location: Left Arm, Patient Position: Sitting, Cuff Size: Normal)   Pulse 92   Temp 97.8 F (36.6 C) (Oral)   Ht 5\' 2"  (1.575 m)   Wt 176 lb 12.8 oz (80.2 kg)   SpO2 96%   BMI 32.34 kg/m   BP Readings from Last 3  Encounters:  10/19/23 (!) 84/60  10/04/23 119/75  08/30/23 (!) 160/96    Wt Readings from Last 3 Encounters:  10/19/23 176 lb 12.8 oz (80.2 kg)  08/30/23 175 lb 2 oz (79.4 kg)  02/24/23 174 lb (78.9 kg)    Physical Exam Vitals reviewed.  Constitutional:      Appearance: Normal appearance.  HENT:     Nose: Nose normal.     Mouth/Throat:     Mouth: Mucous membranes are moist.  Eyes:     General: No scleral icterus.    Conjunctiva/sclera: Conjunctivae normal.  Cardiovascular:     Rate and Rhythm: Normal rate and regular rhythm.     Heart sounds: No murmur heard.    No friction rub. No gallop.     Comments: EKG- SR, 88 bpm Possible inferior/anterior infarct patterns (Q waves in aVF and V3) unchanged Pulmonary:     Effort: Pulmonary effort is normal.     Breath sounds: No stridor. No wheezing, rhonchi or rales.  Abdominal:     General: Abdomen is flat.     Palpations: There is no mass.     Tenderness: There is no abdominal tenderness. There is no guarding.     Hernia: No hernia is present.  Musculoskeletal:        General: Normal range of motion.     Cervical back: Neck supple.     Right lower leg: No edema.     Left lower leg: No edema.  Lymphadenopathy:     Cervical: No cervical adenopathy.  Skin:    General: Skin is warm and dry.  Neurological:     General: No focal deficit present.     Mental Status: She is alert. Mental status is at baseline.  Psychiatric:        Mood and Affect: Mood normal.        Behavior: Behavior normal.     Lab Results  Component Value Date   WBC 6.9 10/19/2023   HGB 14.8 10/19/2023   HCT 44.5 10/19/2023   PLT 193.0 10/19/2023   GLUCOSE 68 (L) 10/19/2023   CHOL 130 08/30/2023   TRIG 62.0 08/30/2023   HDL 52.10 08/30/2023   LDLCALC 66 08/30/2023   ALT 17 10/19/2023   AST 21 10/19/2023   NA 140 10/19/2023   K 4.0 10/19/2023   CL 104 10/19/2023   CREATININE 1.27 (H) 10/19/2023   BUN 22 10/19/2023   CO2 28 10/19/2023    TSH 1.42 02/24/2023   INR 1.0 06/03/2022   HGBA1C 5.4 02/18/2015    CT HEAD WO CONTRAST ( )  Result Date: 09/01/2023 CLINICAL DATA:  Headache, new onset (Age >= 51y) EXAM: CT HEAD WITHOUT CONTRAST TECHNIQUE: Contiguous axial images were obtained from the base of the skull through the vertex without intravenous contrast. RADIATION DOSE REDUCTION: This exam was performed according to the departmental dose-optimization program which includes automated exposure control, adjustment of the mA and/or  kV according to patient size and/or use of iterative reconstruction technique. COMPARISON:  None Available. FINDINGS: Brain: Extensive periventricular and subcortical hypodensity, favored to represent sequela of moderate to severe chronic microvascular ischemic change. No hydrocephalus. No mass effect. No mass lesion. No CT evidence of an acute cortical infarct. Hemorrhage. Vascular: No hyperdense vessel or unexpected calcification. Skull: Normal. Negative for fracture or focal lesion. Sinuses/Orbits: No middle ear or mastoid effusion. Paranasal sinuses are clear. Bilateral lens replacement. Orbits are otherwise unremarkable. Other: None. IMPRESSION: 1. No CT etiology for headaches identified. 2. Extensive periventricular and subcortical hypodensity, favored to represent sequela of moderate to severe chronic microvascular ischemic change. Electronically Signed   By: Lorenza Cambridge M.D.   On: 09/01/2023 15:29    Assessment & Plan:  SOB (shortness of breath) -     EKG 12-Lead  DOE (dyspnea on exertion) -     CBC with Differential/Platelet; Future -     Brain natriuretic peptide; Future -     Troponin I (High Sensitivity); Future -     Hepatic function panel; Future -     D-dimer, quantitative; Future -     CT CORONARY MORPH W/CTA COR W/SCORE W/CA W/CM &/OR WO/CM; Future -     ECHOCARDIOGRAM COMPLETE; Future  Chest pressure- Will evaluate for CAD with CT of the coronary arteries. -     Brain natriuretic  peptide; Future -     Troponin I (High Sensitivity); Future -     D-dimer, quantitative; Future -     CT CORONARY MORPH W/CTA COR W/SCORE W/CA W/CM &/OR WO/CM; Future -     AMB Referral VBCI Care Management  Chronic fatigue -     Basic metabolic panel; Future -     Cortisol; Future -     Hepatic function panel; Future -     CT CORONARY MORPH W/CTA COR W/SCORE W/CA W/CM &/OR WO/CM; Future -     AMB Referral VBCI Care Management  Idiopathic hypotension- Labs are reassuring with the exception of a slight decrease in her GFR.  Will discontinue hydralazine and the ARB. -     Basic metabolic panel; Future -     CBC with Differential/Platelet; Future -     Cortisol; Future -     Hepatic function panel; Future -     ECHOCARDIOGRAM COMPLETE; Future -     AMB Referral VBCI Care Management  Stage 3b chronic kidney disease (HCC) -     AMB Referral VBCI Care Management  Abnormal electrocardiogram (ECG) (EKG)- Will evaluate for wall motion abnormalities, decreased ejection fracture, and diastolic dysfunction. -     ECHOCARDIOGRAM COMPLETE; Future     Follow-up: No follow-ups on file.  Sanda Linger, MD

## 2023-10-20 ENCOUNTER — Telehealth: Payer: Self-pay | Admitting: Internal Medicine

## 2023-10-20 ENCOUNTER — Telehealth (HOSPITAL_COMMUNITY): Payer: Self-pay | Admitting: *Deleted

## 2023-10-20 DIAGNOSIS — R9431 Abnormal electrocardiogram [ECG] [EKG]: Secondary | ICD-10-CM

## 2023-10-20 DIAGNOSIS — N1832 Chronic kidney disease, stage 3b: Secondary | ICD-10-CM

## 2023-10-20 HISTORY — DX: Chronic kidney disease, stage 3b: N18.32

## 2023-10-20 HISTORY — DX: Abnormal electrocardiogram (ECG) (EKG): R94.31

## 2023-10-20 NOTE — Telephone Encounter (Signed)
Called patient to review cardiac CT instructions.  Pt checked her BP at home over the phone.  BP was found to be 80/62 with a  HR of 79. From the notes, it appears that patient had just discontinued some of her BP medications.  Advised to delay scan for a week to allow changes in BP medications to take effect. She verbalized understanding. New appointment made for December 18 at 10:15 AM.  Larey Brick RN Navigator Cardiac Imaging [facility] [phone] Office [phone] Cell

## 2023-10-20 NOTE — Telephone Encounter (Signed)
Could you explain to me why you ordered the Ct so I can explain it to her when I call ?

## 2023-10-20 NOTE — Telephone Encounter (Signed)
Patient has questions about the ct that Dr. Yetta Barre has ordered - please call patient at:  [phone]

## 2023-10-21 ENCOUNTER — Ambulatory Visit: Admission: RE | Admit: 2023-10-21 | Payer: Medicare PPO | Source: Ambulatory Visit

## 2023-10-21 ENCOUNTER — Telehealth: Payer: Self-pay

## 2023-10-21 NOTE — Progress Notes (Signed)
   Care Guide Note  10/21/2023 Name: Kelli Contreras MRN: [id number] DOB: 11/11/1950  Referred by: Etta Grandchild, MD Reason for referral : Care Coordination (Outreach to schedule with Pharm d )   [name] is a 72 y.o. year old female who is a primary care patient of Etta Grandchild, MD. Kelli Contreras was referred to the pharmacist for assistance related to CKD Stage 3 .    Successful contact was made with the patient to discuss pharmacy services including being ready for the pharmacist to call at least 5 minutes before the scheduled appointment time, to have medication bottles and any blood sugar or blood pressure readings ready for review. The patient agreed to meet with the pharmacist via with the pharmacist via telephone visit on (date/time).  11/15/2023  Penne Lash , RMA     [place]  [facility] Guide  Direct Dial: [phone]  Website: [email or web address]

## 2023-10-21 NOTE — Telephone Encounter (Signed)
Just as FYI the patient was supposed to get this done today but her BP was still low so they are going to do it next week on the 18th.

## 2023-10-22 NOTE — Telephone Encounter (Signed)
Patient returned my call she stated her bp was 115/83 I spoke with Dr. Yetta Barre he said that the BP is fine she doesn't need to come in the office. She denied any abdominal or back pain.

## 2023-10-22 NOTE — Telephone Encounter (Signed)
Unable to reach patient. LMTRC  

## 2023-10-26 ENCOUNTER — Telehealth (HOSPITAL_COMMUNITY): Payer: Self-pay | Admitting: Emergency Medicine

## 2023-10-26 DIAGNOSIS — R079 Chest pain, unspecified: Secondary | ICD-10-CM

## 2023-10-26 MED ORDER — IVABRADINE HCL 5 MG PO TABS: 15.0000 mg | ORAL_TABLET | Freq: Once | ORAL | 0 refills | Status: AC

## 2023-10-26 NOTE — Telephone Encounter (Signed)
Reaching out to patient to offer assistance regarding upcoming cardiac imaging study; pt verbalizes understanding of appt date/time, parking situation and where to check in, pre-test NPO status and medications ordered, and verified current allergies; name and call back number provided for further questions should they arise Rockwell Alexandria RN Navigator Cardiac Imaging [facility] [phone] office [phone] cell  15mg  ivabradine for HR control

## 2023-10-27 ENCOUNTER — Ambulatory Visit
Admission: RE | Admit: 2023-10-27 | Discharge: 2023-10-27 | Disposition: A | Discharge status: Home / Self Care | Payer: Medicare PPO | Source: Ambulatory Visit | Attending: Internal Medicine | Admitting: Internal Medicine

## 2023-10-27 ENCOUNTER — Ambulatory Visit
Admission: RE | Admit: 2023-10-27 | Discharge: 2023-10-27 | Disposition: A | Discharge status: Home / Self Care | Payer: Self-pay | Source: Ambulatory Visit | Attending: Internal Medicine | Admitting: Internal Medicine

## 2023-10-27 ENCOUNTER — Other Ambulatory Visit: Payer: Self-pay | Admitting: Internal Medicine

## 2023-10-27 DIAGNOSIS — R931 Abnormal findings on diagnostic imaging of heart and coronary circulation: Secondary | ICD-10-CM | POA: Diagnosis not present

## 2023-10-27 DIAGNOSIS — R5382 Chronic fatigue, unspecified: Secondary | ICD-10-CM | POA: Diagnosis not present

## 2023-10-27 DIAGNOSIS — R0609 Other forms of dyspnea: Secondary | ICD-10-CM

## 2023-10-27 DIAGNOSIS — R0789 Other chest pain: Secondary | ICD-10-CM

## 2023-10-27 DIAGNOSIS — I700 Atherosclerosis of aorta: Secondary | ICD-10-CM | POA: Diagnosis not present

## 2023-10-27 DIAGNOSIS — I2510 Atherosclerotic heart disease of native coronary artery without angina pectoris: Secondary | ICD-10-CM | POA: Insufficient documentation

## 2023-10-27 MED ORDER — NITROGLYCERIN 0.4 MG SL SUBL
0.8000 mg | SUBLINGUAL_TABLET | Freq: Once | SUBLINGUAL | Status: AC
  Administered 2023-10-27: 0.8 mg via SUBLINGUAL
  Filled 2023-10-27: qty 25

## 2023-10-27 MED ORDER — METOPROLOL TARTRATE 5 MG/5ML IV SOLN
10.0000 mg | Freq: Once | INTRAVENOUS | Status: AC
  Administered 2023-10-27: 10 mg via INTRAVENOUS
  Filled 2023-10-27: qty 10

## 2023-10-27 MED ORDER — METOPROLOL TARTRATE 5 MG/5ML IV SOLN
INTRAVENOUS | Status: AC
Start: 2023-10-27 — End: ?
  Filled 2023-10-27: qty 10

## 2023-10-27 MED ORDER — IOHEXOL 350 MG/ML SOLN
80.0000 mL | Freq: Once | INTRAVENOUS | Status: AC | PRN
  Administered 2023-10-27: 80 mL via INTRAVENOUS

## 2023-10-27 NOTE — Addendum Note (Signed)
Encounter addended by: Crista Elliot on: 10/27/2023 1:20 PM  Actions taken: Imaging Exam ended

## 2023-10-27 NOTE — Progress Notes (Signed)

## 2023-10-27 NOTE — Addendum Note (Signed)
Encounter addended by: Crista Elliot on: 10/27/2023 1:20 PM  Actions taken: Imaging Exam begun

## 2023-10-28 ENCOUNTER — Other Ambulatory Visit: Payer: Self-pay | Admitting: Internal Medicine

## 2023-10-29 ENCOUNTER — Ambulatory Visit: Payer: Medicare PPO | Attending: Internal Medicine

## 2023-10-29 DIAGNOSIS — R9431 Abnormal electrocardiogram [ECG] [EKG]: Secondary | ICD-10-CM

## 2023-10-29 DIAGNOSIS — R0609 Other forms of dyspnea: Secondary | ICD-10-CM | POA: Diagnosis not present

## 2023-10-29 DIAGNOSIS — I950 Idiopathic hypotension: Secondary | ICD-10-CM | POA: Diagnosis not present

## 2023-10-29 LAB — ECHOCARDIOGRAM COMPLETE
Area-P 1/2: 4.06 cm2
P 1/2 time: 489 ms
S' Lateral: 2.9 cm

## 2023-11-06 ENCOUNTER — Other Ambulatory Visit: Payer: Self-pay | Admitting: Internal Medicine

## 2023-11-06 DIAGNOSIS — G43709 Chronic migraine without aura, not intractable, without status migrainosus: Secondary | ICD-10-CM

## 2023-11-11 ENCOUNTER — Other Ambulatory Visit: Payer: Self-pay | Admitting: Internal Medicine

## 2023-11-11 ENCOUNTER — Telehealth: Payer: Self-pay

## 2023-11-11 DIAGNOSIS — I10 Essential (primary) hypertension: Secondary | ICD-10-CM

## 2023-11-11 DIAGNOSIS — G43709 Chronic migraine without aura, not intractable, without status migrainosus: Secondary | ICD-10-CM

## 2023-11-11 DIAGNOSIS — I119 Hypertensive heart disease without heart failure: Secondary | ICD-10-CM

## 2023-11-11 MED ORDER — TORSEMIDE 20 MG PO TABS
20.0000 mg | ORAL_TABLET | Freq: Every day | ORAL | 0 refills | Status: DC
Start: 2023-11-11 — End: 2023-11-15

## 2023-11-11 MED ORDER — IMIPRAMINE HCL 50 MG PO TABS: 100.0000 mg | ORAL_TABLET | Freq: Every day | ORAL | 0 refills | Status: DC

## 2023-11-11 NOTE — Telephone Encounter (Signed)
 Noted, I have appt with her on Monday 1/6 for follow up already. I can try calling tomorrow as well.  Arbutus Leas, PharmD, BCPS, CPP Clinical Pharmacist Practitioner [place] [facility] at [facility] [phone]

## 2023-11-11 NOTE — Telephone Encounter (Signed)
 Pt is needing the medication filled today she does not sleep well when she does not have the medication she would like a call back as well regarding this medication refill imipramine (TOFRANIL) 50 MG tablet [

## 2023-11-11 NOTE — Telephone Encounter (Signed)
 Called and spoke with patient regarding their medication refill. During the call patient was wanting to share that they are still having issues managing blood pressure. Patient noted they had been taken off of their two blood pressure medications and was doing fine for a while.   Patient started experiment symptomatic BP and started taking their old hydralazine  script which alleviated symptoms. However while on the phone patient had taken their blood pressure and noted it being 175/108. Patient wanted me to get this message back to their PCP as well as our pharmacist.

## 2023-11-15 ENCOUNTER — Other Ambulatory Visit: Payer: Medicare PPO | Admitting: Pharmacist

## 2023-11-15 DIAGNOSIS — E269 Hyperaldosteronism, unspecified: Secondary | ICD-10-CM

## 2023-11-15 DIAGNOSIS — E785 Hyperlipidemia, unspecified: Secondary | ICD-10-CM

## 2023-11-15 DIAGNOSIS — G43709 Chronic migraine without aura, not intractable, without status migrainosus: Secondary | ICD-10-CM

## 2023-11-15 DIAGNOSIS — I10 Essential (primary) hypertension: Secondary | ICD-10-CM

## 2023-11-15 DIAGNOSIS — I119 Hypertensive heart disease without heart failure: Secondary | ICD-10-CM

## 2023-11-15 MED ORDER — DULOXETINE HCL 30 MG PO CPEP: 30.0000 mg | ORAL_CAPSULE | Freq: Every day | ORAL | 0 refills | Status: DC

## 2023-11-15 MED ORDER — SPIRONOLACTONE 50 MG PO TABS: 50.0000 mg | ORAL_TABLET | Freq: Every day | ORAL | 0 refills | Status: DC

## 2023-11-15 MED ORDER — METOPROLOL SUCCINATE ER 25 MG PO TB24: 25.0000 mg | ORAL_TABLET | Freq: Every evening | ORAL | 0 refills | Status: DC

## 2023-11-15 MED ORDER — ATORVASTATIN CALCIUM 20 MG PO TABS: 20.0000 mg | ORAL_TABLET | Freq: Every day | ORAL | 0 refills | Status: DC

## 2023-11-15 NOTE — Patient Instructions (Signed)
 It was a pleasure speaking with you today!  Stop torsemide  per Dr. Joshua. Continue spironolactone  and metoprolol  for blood pressure. Keep checking blood pressures and call us  if you are seeing frequent readings above 140/90.     Feel free to call with any questions or concerns!  Darrelyn Drum, PharmD, BCPS [facility] Clinical Pharmacist [facility] [phone]

## 2023-11-15 NOTE — Progress Notes (Signed)
 11/15/2023 Name: Kelli Contreras MRN: [id number] DOB: 01-19-1951  Chief Complaint  Patient presents with   Hypertension   Medication Management    Kelli Contreras is a 73 y.o. year old female who presented for a telephone visit.   They were referred to the pharmacist by their PCP for assistance in managing hypertension.    Subjective:  Care Team: Primary Care Provider: Joshua Debby CROME, MD ; Next Scheduled Visit: none scheduled  Medication Access/Adherence  Current Pharmacy:  [id number] - [place], [place] - [address] [facility] [place] [address] Phone: [phone] Fax: [phone]  [facility] - [place], [place] - [address] [place] [address] Phone: [phone] Fax: [phone]   Patient reports affordability concerns with their medications: No  Patient reports access/transportation concerns to their pharmacy: No  Patient reports adherence concerns with their medications:  No     Hypertension:  Current medications: metoprolol  succinate 25 mg daily, spironolactone  50 mg daily, torsemide  20 mg daily (started 12/10) Medications previously tried: olmesartan , hydralazine  (d/c due to hypotension)  Patient has a validated, automated, upper arm home BP cuff Current blood pressure readings:  This morning 126/77 Yesterday morning 124/87, 141/105 1/4 AM 131/94, PM 82/64  Patient reports s/sx hypotension such as dizziness or lightheadedness on 1/4 in the evening. Patient denies SOB or chest pain.   Objective: BP Readings from Last 3 Encounters:  10/27/23 109/71  10/19/23 (!) 84/60  10/04/23 119/75     Lab Results  Component Value Date   HGBA1C 5.4 02/18/2015    Lab Results  Component Value Date   CREATININE 1.27 (H) 10/19/2023   BUN 22 10/19/2023   NA 140 10/19/2023   K 4.0 10/19/2023   CL 104 10/19/2023   CO2 28 10/19/2023    Lab Results  Component Value Date   CHOL 130  08/30/2023   HDL 52.10 08/30/2023   LDLCALC 66 08/30/2023   TRIG 62.0 08/30/2023   CHOLHDL 2 08/30/2023    Medications Reviewed Today     Reviewed by Merceda Lela SAUNDERS, RPH (Pharmacist) on 11/15/23 at 1339  Med List Status: <None>   Medication Order Taking? Sig Documenting Provider Last Dose Status Informant  aspirin 81 MG tablet [id number] Yes Take 81 mg by mouth at bedtime.  [provider] Taking Active Self           Med Note BEVERLEE, JEFFREY W   Wed Feb 28, 2020 12:55 AM)    atorvastatin  (LIPITOR) 20 MG tablet [id number] Yes TAKE 1 TABLET EVERY DAY Joshua Debby CROME, MD Taking Active   DULoxetine  (CYMBALTA ) 30 MG capsule [id number] Yes TAKE 1 CAPSULE EVERY DAY Joshua Debby CROME, MD Taking Active   imipramine  (TOFRANIL ) 50 MG tablet [id number] Yes Take 2 tablets (100 mg total) by mouth at bedtime. Joshua Debby CROME, MD Taking Active   metoprolol  succinate (TOPROL -XL) 25 MG 24 hr tablet [id number] Yes Take 1 tablet (25 mg total) by mouth at bedtime. Annual appt due in July must see provider for future refills Joshua Debby CROME, MD Taking Active   pantoprazole  (PROTONIX ) 40 MG tablet [id number]  TAKE 1 TABLET EVERY DAY Joshua Debby CROME, MD  Active   spironolactone  (ALDACTONE ) 50 MG tablet [id number] Yes TAKE 1 TABLET BY MOUTH EVERY DAY Joshua Debby CROME, MD Taking Active   SUMAtriptan  (IMITREX ) 100 MG tablet [id number]  Take 1/2 tab (50mg ) at onset of migraine. May take other half  tab in 2 hours if headache persists or recurs. Melonie Colonel, Mikel HERO, MD  Active   topiramate  (TOPAMAX ) 50 MG tablet [id number]  TAKE 1 TABLET TWICE DAILY Joshua Debby CROME, MD  Active   torsemide  (DEMADEX ) 20 MG tablet [id number] Yes Take 1 tablet (20 mg total) by mouth daily. Joshua Debby CROME, MD Taking Active               Assessment/Plan:   Hypertension: - Currently controlled but labile, BP goal <140/90 - Reviewed appropriate blood pressure monitoring technique and reviewed goal blood pressure. Recommended to  check home blood pressure and heart rate daily - Consulted with PCP regarding labile BP in the evening, Recommended to stop torsemide . Patient informed. -Sent refills for atorvastatin , spironolactone , metoprolol , duloxetine  - patient requested cardio referral given recent HFpEF diagnosis   Follow Up Plan: 2 weeks  Darrelyn Drum, PharmD, BCPS Clinical Pharmacist [facility] [phone]

## 2023-11-25 ENCOUNTER — Other Ambulatory Visit: Payer: Self-pay | Admitting: Internal Medicine

## 2023-11-25 DIAGNOSIS — I10 Essential (primary) hypertension: Secondary | ICD-10-CM

## 2023-11-26 ENCOUNTER — Encounter: Payer: Self-pay | Admitting: Cardiology

## 2023-11-26 DIAGNOSIS — K589 Irritable bowel syndrome without diarrhea: Secondary | ICD-10-CM | POA: Insufficient documentation

## 2023-11-26 DIAGNOSIS — G43909 Migraine, unspecified, not intractable, without status migrainosus: Secondary | ICD-10-CM | POA: Insufficient documentation

## 2023-11-26 DIAGNOSIS — H269 Unspecified cataract: Secondary | ICD-10-CM | POA: Insufficient documentation

## 2023-11-26 DIAGNOSIS — M810 Age-related osteoporosis without current pathological fracture: Secondary | ICD-10-CM | POA: Insufficient documentation

## 2023-11-26 NOTE — Progress Notes (Unsigned)
Cardiology Office Note:    Date:  11/29/2023   ID:  Kelli Contreras, DOB 13-Jul-1951, MRN [id number]  PCP:  Etta Grandchild, MD  Cardiologist:  Norman Herrlich, MD    Referring MD: Etta Grandchild, MD    ASSESSMENT:    1. Mild CAD   2. Agatston coronary artery calcium score between 100 and 199   3. Nonrheumatic aortic valve insufficiency   4. Hypertensive heart disease without heart failure   5. Hyperlipidemia with target LDL less than 130   6. EKG abnormality   7. Stage 3b chronic kidney disease (HCC)    PLAN:    In order of problems listed above:  She has mild CAD with normal flow and is a stable pattern of angina Continue aspirin beta-blocker high intensity statin and add low-dose amlodipine antianginal effect and given a prescription instructions for nitroglycerin She is on good medical therapy including her high intensity statin will screen for LP(a) and check ApoB to see if she is optimized with a goal level less than 60 with a very high coronary calcium score Stable hypertension Stable EKG pattern no evidence of preceding infarction by echo or cardiac CTA I encouraged her to get a regular activity program and if unable to achieve goals cardiac rehabilitation is appropriate with stable CAD   Next appointment: 3 months   Medication Adjustments/Labs and Tests Ordered: Current medicines are reviewed at length with the patient today.  Concerns regarding medicines are outlined above.  Orders Placed This Encounter  Procedures   Lipid Profile   Apolipoprotein B   Lipoprotein A (LPA)   EKG 12-Lead   Meds ordered this encounter  Medications   nitroGLYCERIN (NITROSTAT) 0.4 MG SL tablet    Sig: Place 1 tablet (0.4 mg total) under the tongue every 5 (five) minutes as needed for chest pain.    Dispense:  25 tablet    Refill:  1   amLODipine (NORVASC) 2.5 MG tablet    Sig: Take 1 tablet (2.5 mg total) by mouth daily.    Dispense:  90 tablet    Refill:  3     History of  Present Illness:    Kelli Contreras is a 73 y.o. female with a hx of elevated coronary artery calcium score mild nonobstructive CAD last seen by cardiology Dr. Antoine Poche 2013 and now is referred back after pulmonary evaluation and cardiac testing.  She was seen by  Dr. Sanda Linger 10/19/2019 for for shortness of breath and chest discomfort.  Other problems included fatigue idiopathic hypotension stage IIIb CKD and abnormal EKG.  BNP level was quite low at 18 in the rule out heart failure range cortisol was normal D-dimer was not elevators and a high-sensitivity troponin was low.  The EKG performed that day independently reviewed by me showed heart rate of 88 bpm sinus rhythm abnormal R wave progression consider old anterior MI.  She was previously seen in our [place] office in November 2013 with chest pain.  She had a history of hypertension and hyperlipidemia.  At that time she had a normal myocardial perfusion study.  Subsequently she had a coronary artery calcium score performed May 2022 coronary artery calcium score elevated 117 80th percentile localized to the left anterior descending coronary artery.  She had a cardiac CTA performed 10/27/2023 a coronary calcium score at that time was 100 4877 percentile and she had moderate CAD mid LAD 50% proximal mLAD 25%.  With normal FFR.  She also had  an echocardiogram performed 11/06/2023 mild aortic regurgitation was noted normal left ventricular size mild hypertrophy EF 60 to 65% and normal right ventricle.  Compliance with diet, lifestyle and medications: Yes  She had a cardiac CTA done for evaluation of chronic chest pain syndrome she has episodes with physical activity of chest tightness and shortness of breath but also episodes at night which had been awakened from her sleep and she describes this as burning heavy radiates into the left arm and resolved spontaneously.  She has an episode perhaps every 2 months. She is somewhat emotional about heart  disease her husband died sudden cardiac death 16 years ago. Her cardiac CTA showed the presence of CAD but not severe flow-limiting and medical therapy is appropriate She does not have heart failure but she does have mild valvular aortic regurgitation. She is on good medical therapy including aspirin high intensity statin and takes a beta-blocker for migraine prophylaxis Presently she is not getting regular physical activity we discussed cardiac rehab and she is going to start a walking program with a friend and I challenged her to get up to 6500 steps a day or 20 to 30 minutes a day and asked to reconsider cardiac rehabilitation When I recheck her lipid profile today and screen her for LP(a) and check an ApoB to see if she is optimized She is having no muscle pain or weakness from her statin Has no history of congenital or rheumatic heart disease or atrial fibrillation Past Medical History:  Diagnosis Date   Abnormal electrocardiogram (ECG) (EKG) 10/20/2023   Cataract    bil cataracts removed   Chest pressure 08/30/2023   Chronic fatigue 10/19/2023   Chronic migraine without aura without status migrainosus, not intractable 06/07/2016   Depression    Diarrhea    Encounter for general adult medical examination with abnormal findings 02/21/2021   Estrogen deficiency 02/24/2023   Gastroesophageal reflux disease without esophagitis 06/07/2016   Grade I diastolic dysfunction 03/25/2021   Mar 2022     IMPRESSIONS         1. Left ventricular ejection fraction, by estimation, is 60 to 65%. The left ventricle has normal function. The left ventricle has no regional wall motion abnormalities. There is mild left ventricular hypertrophy. Left ventricular diastolic parameters   are consistent with Grade I diastolic dysfunction (impaired relaxation).   2. Right ventricular systolic function   Hyperaldosteronism (HCC) 06/10/2022   Hyperlipidemia with target LDL less than 130 02/19/2021   The ASCVD Risk  score (Arnett DK, et al., 2019) failed to calculate for the following reasons:    The valid total cholesterol range is 130 to 320 mg/dL      Hypertension    IBS (irritable bowel syndrome)    diarrhea   Idiopathic hypotension 10/19/2023   Estimated Creatinine Clearance: 41 mL/min (A) (by C-G formula based on SCr of 1.27 mg/dL (H)).      Insomnia    Lung nodule seen on imaging study 10/20/2021   LVH (left ventricular hypertrophy) due to hypertensive disease, without heart failure 02/19/2021   Migraine    Osteoporosis    Phreesia 06/11/2020   Plantar fasciitis    Primary hypertension 12/01/2007   Estimated Creatinine Clearance: 49.9 mL/min (by C-G formula based on SCr of 1.05 mg/dL).              Aldosterone     ng/dL    7       Comment: .  Unable to flag abnormal  result(s), please refer      to reference range(s) below:  .  Adult Reference Ranges for Aldosterone, LC/MS/MS:      Upright  8:00 - 10:00 am    < or = 28 ng/dL      Upright  1:61 -  0:96 pm    < or = 21 ng/dL      Supine    Senile purpura (HCC) 06/03/2022   SOB (shortness of breath) 08/30/2023   Splenic artery aneurysm (HCC) 10/30/2021   Stage 3a chronic kidney disease (HCC) 02/19/2021   Estimated Creatinine Clearance: 58.6 mL/min (by C-G formula based on SCr of 0.9 mg/dL).     Estimated Creatinine Clearance: 55.5 mL/min (by C-G formula based on SCr of 0.93 mg/dL).      Estimated Creatinine Clearance: 48 mL/min (by C-G formula based on SCr of 1.08 mg/dL).      Stage 3b chronic kidney disease (HCC) 10/20/2023    Current Medications: Current Meds  Medication Sig   amLODipine (NORVASC) 2.5 MG tablet Take 1 tablet (2.5 mg total) by mouth daily.   aspirin 81 MG tablet Take 81 mg by mouth at bedtime.    atorvastatin (LIPITOR) 20 MG tablet Take 1 tablet (20 mg total) by mouth daily.   DULoxetine (CYMBALTA) 30 MG capsule Take 1 capsule (30 mg total) by mouth daily.   imipramine (TOFRANIL) 50 MG tablet Take 2 tablets (100 mg  total) by mouth at bedtime.   metoprolol succinate (TOPROL-XL) 25 MG 24 hr tablet Take 1 tablet (25 mg total) by mouth at bedtime. Annual appt due in July must see provider for future refills   nitroGLYCERIN (NITROSTAT) 0.4 MG SL tablet Place 1 tablet (0.4 mg total) under the tongue every 5 (five) minutes as needed for chest pain.   pantoprazole (PROTONIX) 40 MG tablet TAKE 1 TABLET EVERY DAY   spironolactone (ALDACTONE) 50 MG tablet Take 1 tablet (50 mg total) by mouth daily.   SUMAtriptan (IMITREX) 100 MG tablet Take 1/2 tab (50mg ) at onset of migraine. May take other half tab in 2 hours if headache persists or recurs.   topiramate (TOPAMAX) 50 MG tablet TAKE 1 TABLET TWICE DAILY      EKGs/Labs/Other Studies Reviewed:    The following studies were reviewed today:  Cardiac Studies & Procedures      ECHOCARDIOGRAM  ECHOCARDIOGRAM COMPLETE 10/29/2023  Narrative ECHOCARDIOGRAM REPORT    Patient Name:   Kelli Contreras Date of Exam: 10/29/2023 Medical Rec #:  [id number]       Height:       62.0 in Accession #:    [id number]      Weight:       176.8 lb Date of Birth:  August 04, 1951      BSA:          1.814 m Patient Age:    72 years        BP:           109/71 mmHg Patient Gender: F               HR:           85 bpm. Exam Location:  [place]  Procedure: 2D Echo, Cardiac Doppler, Color Doppler and Strain Analysis  Indications:    DOE (dyspnea on exertion) [R06.09 (ICD-10-CM)]; Idiopathic hypotension [I95.0 (ICD-10-CM)]; Abnormal electrocardiogram (ECG) (EKG) [R94.31 (ICD-10-CM)]  History:        Patient has prior history of Echocardiogram examinations, most recent 03/25/2021.  Signs/Symptoms:Chest Pain and Dyspnea; Risk Factors:Dyslipidemia and Hypertension.  Sonographer:    Margreta Journey RDCS Referring Phys: [id number] Etta Grandchild  IMPRESSIONS   1. Left ventricular ejection fraction, by estimation, is 60 to 65%. The left ventricle has normal function. The left ventricle has  no regional wall motion abnormalities. There is mild asymmetric left ventricular hypertrophy of the basal-septal segment. Left ventricular diastolic parameters are consistent with Grade I diastolic dysfunction (impaired relaxation). The average left ventricular global longitudinal strain is -13.8 %. The global longitudinal strain is abnormal. 2. Right ventricular systolic function is normal. The right ventricular size is normal. 3. The mitral valve is normal in structure. No evidence of mitral valve regurgitation. No evidence of mitral stenosis. 4. The aortic valve is normal in structure. Aortic valve regurgitation is mild. No aortic stenosis is present. 5. The inferior vena cava is normal in size with greater than 50% respiratory variability, suggesting right atrial pressure of 3 mmHg.  FINDINGS Left Ventricle: Left ventricular ejection fraction, by estimation, is 60 to 65%. The left ventricle has normal function. The left ventricle has no regional wall motion abnormalities. The average left ventricular global longitudinal strain is -13.8 %. The global longitudinal strain is abnormal. The left ventricular internal cavity size was normal in size. There is mild asymmetric left ventricular hypertrophy of the basal-septal segment. Left ventricular diastolic parameters are consistent with Grade I diastolic dysfunction (impaired relaxation).  Right Ventricle: The right ventricular size is normal. No increase in right ventricular wall thickness. Right ventricular systolic function is normal.  Left Atrium: Left atrial size was normal in size.  Right Atrium: Right atrial size was normal in size.  Pericardium: There is no evidence of pericardial effusion.  Mitral Valve: The mitral valve is normal in structure. No evidence of mitral valve regurgitation. No evidence of mitral valve stenosis.  Tricuspid Valve: The tricuspid valve is normal in structure. Tricuspid valve regurgitation is not demonstrated. No  evidence of tricuspid stenosis.  Aortic Valve: The aortic valve is normal in structure. Aortic valve regurgitation is mild. Aortic regurgitation PHT measures 489 msec. No aortic stenosis is present.  Pulmonic Valve: The pulmonic valve was normal in structure. Pulmonic valve regurgitation is not visualized. No evidence of pulmonic stenosis.  Aorta: The aortic root is normal in size and structure.  Venous: The inferior vena cava is normal in size with greater than 50% respiratory variability, suggesting right atrial pressure of 3 mmHg.  IAS/Shunts: No atrial level shunt detected by color flow Doppler.   LEFT VENTRICLE PLAX 2D LVIDd:         4.10 cm   Diastology LVIDs:         2.90 cm   LV e' medial:    7.29 cm/s LV PW:         0.90 cm   LV E/e' medial:  8.8 LV IVS:        1.20 cm   LV e' lateral:   10.70 cm/s LVOT diam:     2.20 cm   LV E/e' lateral: 6.0 LV SV:         79 LV SV Index:   44        2D Longitudinal Strain LVOT Area:     3.80 cm  2D Strain GLS Avg:     -13.8 %   RIGHT VENTRICLE             IVC RV Basal diam:  3.70 cm     IVC diam:  1.80 cm RV Mid diam:    3.20 cm RV S prime:     14.80 cm/s TAPSE (M-mode): 1.9 cm  LEFT ATRIUM             Index        RIGHT ATRIUM           Index LA diam:        3.40 cm 1.87 cm/m   RA Area:     12.30 cm LA Vol (A2C):   34.4 ml 18.96 ml/m  RA Volume:   26.90 ml  14.83 ml/m LA Vol (A4C):   38.7 ml 21.33 ml/m LA Biplane Vol: 37.8 ml 20.84 ml/m AORTIC VALVE LVOT Vmax:   99.70 cm/s LVOT Vmean:  69.200 cm/s LVOT VTI:    0.209 m AI PHT:      489 msec  AORTA Ao Root diam: 3.40 cm Ao Asc diam:  3.50 cm Ao Desc diam: 1.90 cm  MITRAL VALVE MV Area (PHT): 4.06 cm    SHUNTS MV Decel Time: 187 msec    Systemic VTI:  0.21 m MV E velocity: 64.50 cm/s  Systemic Diam: 2.20 cm MV A velocity: 85.50 cm/s MV E/A ratio:  0.75  Gypsy Balsam MD Electronically signed by Gypsy Balsam MD Signature Date/Time: 10/29/2023/5:20:36  PM    Final   MONITORS  CARDIAC EVENT MONITOR 11/27/2021  Narrative NSR No significant arrhythmia  Charlton Haws MD [facility]  CT SCANS  CT CORONARY MORPH W/CTA COR W/SCORE 10/27/2023  Addendum 10/27/2023  2:38 PM ADDENDUM REPORT: 10/27/2023 14:36  EXAM: OVER-READ INTERPRETATION  CT CHEST  The following report is an over-read performed by radiologist Dr. [facility] on 10/27/2023. This over-read does not include interpretation of cardiac or coronary anatomy or pathology. The coronary CTA interpretation by the cardiologist is attached.  COMPARISON:  Chest CT 03/31/2023  FINDINGS: Normal caliber thoracic aorta with scattered atherosclerotic calcifications. Calcifications also noted around the aortic valve.  No mediastinal or hilar mass lymphadenopathy. The visualized esophagus is grossly.  Mild dependent bibasilar subsegmental atelectasis but no pulmonary lesions or pulmonary nodules. The central tracheobronchial tree is unremarkable.  No significant upper abdominal findings.  IMPRESSION: 1. No significant extracardiac findings. 2. Aortic atherosclerosis.  Aortic Atherosclerosis (ICD10-I70.0).   Electronically Signed By: Rudie Meyer M.D. On: 10/27/2023 14:36  Narrative CLINICAL DATA:  Chest pain, shortness of breath  EXAM: Cardiac/Coronary  CTA  TECHNIQUE: The patient was scanned on a Siemens Somatom go.Top scanner.  : A retrospective scan was triggered in the ascending thoracic aorta. Axial non-contrast 3 mm slices were carried out through the heart. The data set was analyzed on a dedicated work station and scored using the Agatson method. Gantry rotation speed was 330 msecs and collimation was .6 mm. 25 mg of metoprolol, 15mg  ivabradine and 0.8 mg of sl NTG was given. The 3D data set was reconstructed in 5% intervals of the 60-95 % of the R-R cycle. Diastolic phases were analyzed on a dedicated work station using MPR, MIP  and VRT modes. The patient received 80 cc of contrast.  FINDINGS: Aorta: Normal size. Mild aortic wall calcifications. No dissection.  Aortic Valve:  Trileaflet.  No calcifications.  Coronary Arteries:  Normal coronary origin.  Right dominance.  RCA is a dominant artery. There is no plaque.  Left main gives rise to LAD and LCX arteries. LM has no disease.  LAD has calcified plaque proximally causing mild stenosis (25-49%). Non calcified plaque in the  mid LAD causing moderate stenosis (50-69%).  LCX is a non-dominant artery.  There is no plaque.  Other findings:  Normal pulmonary vein drainage into the left atrium.  Normal left atrial appendage without a thrombus.  Normal size of the pulmonary artery.  IMPRESSION: 1. Coronary calcium score of 148. This was 77th percentile for age and sex matched control.  2. Normal coronary origin with right dominance.  3. Moderate mid LAD stenosis (50%). mild proximal LAD stenosis (25%)  4. CAD-RADS 3. Moderate stenosis. Consider symptom-guided anti-ischemic pharmacotherapy as well as risk factor modification per guideline directed care.  5. Additional analysis with CT FFR will be submitted and reported separately.  Electronically Signed: By: Debbe Odea M.D. On: 10/27/2023 13:16   CT SCANS  CT CARDIAC SCORING (SELF PAY ONLY) 03/25/2021  Addendum 04/11/2021  2:26 PM ADDENDUM REPORT: 04/11/2021 14:24  EXAM: OVER-READ INTERPRETATION  CT CHEST  The following report is an over-read performed by radiologist Dr. Lesia Hausen [facility] on 04/11/2021. This over-read does not include interpretation of cardiac or coronary anatomy or pathology. The coronary CTA interpretation by the cardiologist is attached.  COMPARISON:  None.  FINDINGS: Cardiovascular: Normal heart size. No significant pericardial effusion/thickening. Atherosclerotic nonaneurysmal thoracic aorta. Normal caliber pulmonary  arteries.  Mediastinum/Nodes: Unremarkable esophagus. No pathologically enlarged mediastinal or hilar lymph nodes, noting limited sensitivity for the detection of hilar adenopathy on this noncontrast study.  Lungs/Pleura: No pneumothorax. No pleural effusion. Perifissural 5 mm medial right middle lobe solid pulmonary nodule (series 3/image 7), considered benign. Solid 7 mm posterior left lower lobe pulmonary nodule (series 3/image 11). No acute consolidative airspace disease.  Upper abdomen: No acute abnormality.  Musculoskeletal: No aggressive appearing focal osseous lesions. Moderate thoracic spondylosis.  IMPRESSION: 1. Solid 7 mm posterior left lower lobe pulmonary nodule. Non-contrast chest CT at 6-12 months is recommended. If the nodule is stable at time of repeat CT, then future CT at 18-24 months (from today's scan) is considered optional for low-risk patients, but is recommended for high-risk patients. This recommendation follows the consensus statement: Guidelines for Management of Incidental Pulmonary Nodules Detected on CT Images: From the Fleischner Society 2017; Radiology 2017; 284:228-243. 2. Aortic Atherosclerosis (ICD10-I70.0).   Electronically Signed By: Delbert Phenix M.D. On: 04/11/2021 14:24  Narrative CLINICAL DATA:  Cardiovascular Disease Risk stratification  EXAM: Coronary Calcium Score  TECHNIQUE: A gated, non-contrast computed tomography scan of the heart was performed using 3mm slice thickness. Axial images were analyzed on a dedicated workstation. Calcium scoring of the coronary arteries was performed using the Agatston method.  FINDINGS: Coronary arteries: Normal origins.  Coronary Calcium Score:  Left main: 8.34  Left anterior descending artery: 109  Left circumflex artery: 0  Right coronary artery: 0  Total: 117  Percentile: 80th  Pericardium: Normal.  Ascending Aorta: Normal caliber.  Scattered  calcifications.  Non-cardiac: See separate report from [facility].  IMPRESSION: Coronary calcium score of 117. This was 80th percentile for age-, race-, and sex-matched controls.  RECOMMENDATIONS: Coronary artery calcium (CAC) score is a strong predictor of incident coronary heart disease (CHD) and provides predictive information beyond traditional risk factors. CAC scoring is reasonable to use in the decision to withhold, postpone, or initiate statin therapy in intermediate-risk or selected borderline-risk asymptomatic adults (age 54-75 years and LDL-C >=70 to <190 mg/dL) who do not have diabetes or established atherosclerotic cardiovascular disease (ASCVD).* In intermediate-risk (10-year ASCVD risk >=7.5% to <20%) adults or selected borderline-risk (10-year ASCVD risk >=5% to <  7.5%) adults in whom a CAC score is measured for the purpose of making a treatment decision the following recommendations have been made:  If CAC=0, it is reasonable to withhold statin therapy and reassess in 5 to 10 years, as long as higher risk conditions are absent (diabetes mellitus, family history of premature CHD in first degree relatives (males <55 years; females <65 years), cigarette smoking, or LDL >=190 mg/dL).  If CAC is 1 to 99, it is reasonable to initiate statin therapy for patients >=52 years of age.  If CAC is >=100 or >=75th percentile, it is reasonable to initiate statin therapy at any age.  Cardiology referral should be considered for patients with CAC scores >=400 or >=75th percentile.  *2018 AHA/ACC/AACVPR/AAPA/ABC/ACPM/ADA/AGS/APhA/ASPC/NLA/PCNA Guideline on the Management of Blood Cholesterol: A Report of the American College of Cardiology/American Heart Association Task Force on Clinical Practice Guidelines. J Am Coll Cardiol. 2019;73(24):3168-3209.  Armanda Magic, MD  Electronically Signed: By: Armanda Magic On: 03/25/2021 15:14          EKG  Interpretation Date/Time:  Monday November 29 2023 10:04:26 EST Ventricular Rate:  76 PR Interval:  182 QRS Duration:  104 QT Interval:  388 QTC Calculation: 436 R Axis:   -30  Text Interpretation: Normal sinus rhythm Left axis deviation ABNORMAL R WAVE PROGRESSION Confirmed by Norman Herrlich ([id number]) on 11/29/2023 11:07:28 AM   Recent Labs: 02/24/2023: TSH 1.42 10/19/2023: ALT 17; BUN 22; Creatinine, Ser 1.27; Hemoglobin 14.8; Platelets 193.0; Potassium 4.0; Pro B Natriuretic peptide (BNP) 18.0; Sodium 140  Recent Lipid Panel    Component Value Date/Time   CHOL 130 08/30/2023 1057   CHOL 197 02/27/2020 1228   TRIG 62.0 08/30/2023 1057   HDL 52.10 08/30/2023 1057   HDL 53 02/27/2020 1228   CHOLHDL 2 08/30/2023 1057   VLDL 12.4 08/30/2023 1057   LDLCALC 66 08/30/2023 1057   LDLCALC 126 (H) 02/27/2020 1228    Physical Exam:    VS:  BP 124/82   Pulse 76   Ht 5\' 3"  (1.6 m)   Wt 177 lb 3.2 oz (80.4 kg)   SpO2 92%   BMI 31.39 kg/m     Wt Readings from Last 3 Encounters:  11/29/23 177 lb 3.2 oz (80.4 kg)  10/19/23 176 lb 12.8 oz (80.2 kg)  08/30/23 175 lb 2 oz (79.4 kg)     GEN:  Well nourished, well developed in no acute distress HEENT: Normal NECK: No JVD; No carotid bruits LYMPHATICS: No lymphadenopathy CARDIAC: I did not auscultate aortic regurgitation RRR, no murmurs, rubs, gallops RESPIRATORY:  Clear to auscultation without rales, wheezing or rhonchi  ABDOMEN: Soft, non-tender, non-distended MUSCULOSKELETAL:  No edema; No deformity  SKIN: Warm and dry NEUROLOGIC:  Alert and oriented x 3 PSYCHIATRIC:  Normal affect    Signed, Norman Herrlich, MD  11/29/2023 11:09 AM    [facility]

## 2023-11-29 ENCOUNTER — Ambulatory Visit: Payer: Medicare PPO | Attending: Cardiology | Admitting: Cardiology

## 2023-11-29 ENCOUNTER — Other Ambulatory Visit: Payer: Medicare PPO | Admitting: Pharmacist

## 2023-11-29 ENCOUNTER — Encounter: Payer: Self-pay | Admitting: Cardiology

## 2023-11-29 VITALS — BP 124/82 | HR 76 | Ht 63.0 in | Wt 177.2 lb

## 2023-11-29 DIAGNOSIS — R9431 Abnormal electrocardiogram [ECG] [EKG]: Secondary | ICD-10-CM

## 2023-11-29 DIAGNOSIS — I2510 Atherosclerotic heart disease of native coronary artery without angina pectoris: Secondary | ICD-10-CM | POA: Diagnosis not present

## 2023-11-29 DIAGNOSIS — I351 Nonrheumatic aortic (valve) insufficiency: Secondary | ICD-10-CM | POA: Diagnosis not present

## 2023-11-29 DIAGNOSIS — R931 Abnormal findings on diagnostic imaging of heart and coronary circulation: Secondary | ICD-10-CM | POA: Diagnosis not present

## 2023-11-29 DIAGNOSIS — N1832 Chronic kidney disease, stage 3b: Secondary | ICD-10-CM

## 2023-11-29 DIAGNOSIS — I119 Hypertensive heart disease without heart failure: Secondary | ICD-10-CM

## 2023-11-29 DIAGNOSIS — I10 Essential (primary) hypertension: Secondary | ICD-10-CM

## 2023-11-29 DIAGNOSIS — E785 Hyperlipidemia, unspecified: Secondary | ICD-10-CM

## 2023-11-29 MED ORDER — AMLODIPINE BESYLATE 2.5 MG PO TABS: 2.5000 mg | ORAL_TABLET | Freq: Every day | ORAL | 3 refills | Status: DC

## 2023-11-29 MED ORDER — NITROGLYCERIN 0.4 MG SL SUBL: 0.4000 mg | SUBLINGUAL_TABLET | SUBLINGUAL | 1 refills | Status: AC | PRN

## 2023-11-29 NOTE — Progress Notes (Signed)
   11/29/2023 Name: Kelli Contreras MRN: [id number] DOB: December 09, 1950  Chief Complaint  Patient presents with   Hypertension   Medication Management    Kelli Contreras is a 73 y.o. year old female who presented for a telephone visit.   They were referred to the pharmacist by their PCP for assistance in managing hypertension.    Subjective:  Care Team: Primary Care Provider: Etta Grandchild, MD ; Next Scheduled Visit: none scheduled  Medication Access/Adherence  Current Pharmacy:  [id number] - [place], [place] - [address] [place] [address] Phone: [phone] Fax: [phone]  [facility] - [place], [place] - [address] [place] [address] Phone: [phone] Fax: [phone]   Patient reports affordability concerns with their medications: No  Patient reports access/transportation concerns to their pharmacy: No  Patient reports adherence concerns with their medications:  No     Hypertension:  Current medications: metoprolol succinate 25 mg daily, spironolactone 50 mg daily,  Medications previously tried: olmesartan, hydralazine (d/c due to hypotension), torsemide (d/c due to hypotension)  Patient has a validated, automated, upper arm home BP cuff Current blood pressure readings:  This morning 124/82 at cardio visit  Patient saw cardio this morning for referral. Cardio started amlodipine 2.5 mg for antianginal effect and wants to optimize LDL.    Objective: BP Readings from Last 3 Encounters:  11/29/23 124/82  10/27/23 109/71  10/19/23 (!) 84/60     Lab Results  Component Value Date   HGBA1C 5.4 02/18/2015    Lab Results  Component Value Date   CREATININE 1.27 (H) 10/19/2023   BUN 22 10/19/2023   NA 140 10/19/2023   K 4.0 10/19/2023   CL 104 10/19/2023   CO2 28 10/19/2023    Lab Results  Component Value Date   CHOL 130 08/30/2023   HDL 52.10 08/30/2023   LDLCALC 66  08/30/2023   TRIG 62.0 08/30/2023   CHOLHDL 2 08/30/2023    Medications Reviewed Today   Medications were not reviewed in this encounter       Assessment/Plan:   Hypertension: - Currently controlled , BP goal <130/80 - Reviewed appropriate blood pressure monitoring technique and reviewed goal blood pressure. Recommended to check home blood pressure and heart rate daily - Cardio confirmed no CHF diagnosis.  - Reviewed with patient to monitor BP after starting amlodipine and to notify cardio office if begins having hypotension with it. Counseled on purpose of amlodipine per cardio note. - Due for PCP f/u around June   Follow Up Plan: PRN  Kelli Contreras, PharmD, BCPS Clinical Pharmacist [facility] [phone]

## 2023-11-29 NOTE — Patient Instructions (Signed)
 It was a pleasure speaking with you today!    Feel free to call with any questions or concerns!  Arbutus Leas, PharmD, BCPS, CPP Clinical Pharmacist Practitioner [facility] at [facility] [phone]

## 2023-11-29 NOTE — Patient Instructions (Addendum)
Medication Instructions:  Your physician has recommended you make the following change in your medication:   START: Nitroglycerin 0.4 mg under the tongue every 5 minutes x 3 as needed for chest pain START: Amlodipine 2.5 mg daily  *If you need a refill on your cardiac medications before your next appointment, please call your pharmacy*   Lab Work: Your physician recommends that you return for lab work in:   Labs today: Lipids, Apo B, LPa  If you have labs (blood work) drawn today and your tests are completely normal, you will receive your results only by: MyChart Message (if you have MyChart) OR A paper copy in the mail If you have any lab test that is abnormal or we need to change your treatment, we will call you to review the results.   Testing/Procedures: None   Follow-Up: At [facility], you and your health needs are our priority.  As part of our continuing mission to provide you with exceptional heart care, we have created designated Provider Care Teams.  These Care Teams include your primary Cardiologist (physician) and Advanced Practice Providers (APPs -  Physician Assistants and Nurse Practitioners) who all work together to provide you with the care you need, when you need it.  We recommend signing up for the patient portal called "MyChart".  Sign up information is provided on this After Visit Summary.  MyChart is used to connect with patients for Virtual Visits (Telemedicine).  Patients are able to view lab/test results, encounter notes, upcoming appointments, etc.  Non-urgent messages can be sent to your provider as well.   To learn more about what you can do with MyChart, go to [email or web address].    Your next appointment:   3 month(s)  Provider:   Norman Herrlich, MD    Other Instructions Goal of 6500 steps per day or 20 - 30 minutes of exercise per day.

## 2023-12-01 ENCOUNTER — Encounter: Payer: Self-pay | Admitting: Cardiology

## 2023-12-01 LAB — LIPID PANEL
Chol/HDL Ratio: 2.8 {ratio} (ref 0.0–4.4)
Cholesterol, Total: 128 mg/dL (ref 100–199)
HDL: 46 mg/dL (ref >39)
LDL Chol Calc (NIH): 68 mg/dL (ref 0–99)
Triglycerides: 68 mg/dL (ref 0–149)
VLDL Cholesterol Cal: 14 mg/dL (ref 5–40)

## 2023-12-01 LAB — APOLIPOPROTEIN B: Apolipoprotein B: 62 mg/dL (ref <90)

## 2023-12-01 LAB — LIPOPROTEIN A (LPA): Lipoprotein (a): 40.5 nmol/L (ref <75.0)

## 2023-12-03 ENCOUNTER — Other Ambulatory Visit: Payer: Self-pay | Admitting: Internal Medicine

## 2023-12-03 DIAGNOSIS — G43709 Chronic migraine without aura, not intractable, without status migrainosus: Secondary | ICD-10-CM

## 2023-12-29 ENCOUNTER — Other Ambulatory Visit: Payer: Self-pay | Admitting: Internal Medicine

## 2023-12-29 DIAGNOSIS — G43709 Chronic migraine without aura, not intractable, without status migrainosus: Secondary | ICD-10-CM

## 2024-02-08 ENCOUNTER — Other Ambulatory Visit: Payer: Self-pay | Admitting: Internal Medicine

## 2024-02-08 DIAGNOSIS — I10 Essential (primary) hypertension: Secondary | ICD-10-CM

## 2024-02-08 DIAGNOSIS — I119 Hypertensive heart disease without heart failure: Secondary | ICD-10-CM

## 2024-02-08 DIAGNOSIS — G43709 Chronic migraine without aura, not intractable, without status migrainosus: Secondary | ICD-10-CM

## 2024-02-08 DIAGNOSIS — E785 Hyperlipidemia, unspecified: Secondary | ICD-10-CM

## 2024-02-08 DIAGNOSIS — K219 Gastro-esophageal reflux disease without esophagitis: Secondary | ICD-10-CM

## 2024-02-08 DIAGNOSIS — E269 Hyperaldosteronism, unspecified: Secondary | ICD-10-CM

## 2024-02-08 MED ORDER — TOPIRAMATE 50 MG PO TABS: 50.0000 mg | ORAL_TABLET | Freq: Two times a day (BID) | ORAL | 0 refills | Status: DC

## 2024-02-08 MED ORDER — SPIRONOLACTONE 50 MG PO TABS
50.0000 mg | ORAL_TABLET | Freq: Every day | ORAL | 0 refills | Status: DC
Start: 2024-02-08 — End: 2024-05-08

## 2024-02-08 MED ORDER — DULOXETINE HCL 30 MG PO CPEP: 30.0000 mg | ORAL_CAPSULE | Freq: Every day | ORAL | 0 refills | Status: DC

## 2024-02-08 MED ORDER — METOPROLOL SUCCINATE ER 25 MG PO TB24: 25.0000 mg | ORAL_TABLET | Freq: Every evening | ORAL | 0 refills | Status: DC

## 2024-02-08 MED ORDER — IMIPRAMINE HCL 50 MG PO TABS: 100.0000 mg | ORAL_TABLET | Freq: Every day | ORAL | 0 refills | Status: DC

## 2024-02-08 MED ORDER — PANTOPRAZOLE SODIUM 40 MG PO TBEC
40.0000 mg | DELAYED_RELEASE_TABLET | Freq: Every day | ORAL | 0 refills | Status: DC
Start: 2024-02-08 — End: 2024-05-17

## 2024-02-08 MED ORDER — ATORVASTATIN CALCIUM 20 MG PO TABS: 20.0000 mg | ORAL_TABLET | Freq: Every day | ORAL | 0 refills | Status: DC

## 2024-02-08 NOTE — Telephone Encounter (Signed)
 Last Fill: Topiramate: 12/30/23 180 tabs/0 RF     Duloxetine: 11/15/23 90 tabs/0 RF     Atorvastatin: 11/15/23 90 tabs/0 RF     Pantoprazole: 06/05/23 90 tabs/0 RF     Imipramine: 12/03/23 180 tabs/0 RF     Metprolol: 11/15/23 90 tabs/0 RF     Spironolactone: 11/15/23 90 tabs/0 RF  Last OV: 10/19/23 Next OV: 02/28/24 AWV  Routing to provider for review/authorization.

## 2024-02-08 NOTE — Telephone Encounter (Signed)
 Copied from CRM [id number]. Topic: Clinical - Medication Refill >> Feb 08, 2024 10:20 AM [place] J wrote: Most Recent Primary Care Visit:  Provider: Candy Sledge R  Department: [facility]  Visit Type: PATIENT OUTREACH 30  Date: 11/29/2023  Medication:  topiramate (TOPAMAX) 50 MG DULoxetine (CYMBALTA) 30 MG atorvastatin (LIPITOR) 20 MG pantoprazole (PROTONIX) 40 MG imipramine (TOFRANIL) 50 MG metoprolol succinate (TOPROL-XL) 25 MG 24 hr tablet spironolactone (ALDACTONE) 50 MG  Has the patient contacted their pharmacy? Yes (Agent: If no, request that the patient contact the pharmacy for the refill. If patient does not wish to contact the pharmacy document the reason why and proceed with request.) (Agent: If yes, when and what did the pharmacy advise?)  Is this the correct pharmacy for this prescription? Yes If no, delete pharmacy and type the correct one.  This is the patient's preferred pharmacy:   [facility] - [place], [place] - [address] [place] [address] Phone: [phone] Fax: [phone]  Has the prescription been filled recently? No  Is the patient out of the medication? No  Has the patient been seen for an appointment in the last year OR does the patient have an upcoming appointment? Yes  Can we respond through MyChart? Yes  Agent: Please be advised that Rx refills may take up to 3 business days. We ask that you follow-up with your pharmacy.

## 2024-02-28 ENCOUNTER — Ambulatory Visit (INDEPENDENT_AMBULATORY_CARE_PROVIDER_SITE_OTHER): Payer: Medicare PPO

## 2024-02-28 VITALS — BP 118/62 | HR 72 | Ht 63.0 in | Wt 178.2 lb

## 2024-02-28 DIAGNOSIS — Z1231 Encounter for screening mammogram for malignant neoplasm of breast: Secondary | ICD-10-CM | POA: Diagnosis not present

## 2024-02-28 DIAGNOSIS — Z0000 Encounter for general adult medical examination without abnormal findings: Secondary | ICD-10-CM | POA: Diagnosis not present

## 2024-02-28 NOTE — Patient Instructions (Addendum)
 Ms. Marker , Thank you for taking time to come for your Medicare Wellness Visit. I appreciate your ongoing commitment to your health goals. Please review the following plan we discussed and let me know if I can assist you in the future.   Referrals/Orders/Follow-Ups/Clinician Recommendations: Aim for 30 minutes of exercise or brisk walking, 6-8 glasses of water, and 5 servings of fruits and vegetables each day. Educated and advised on the Pneumonia, Shingles, and COVID vaccines.  [facility].  This is a list of the screening recommended for you and due dates:  Health Maintenance  Topic Date Due   Pneumonia Vaccine (1 of 2 - PCV) Never done   Zoster (Shingles) Vaccine (1 of 2) Never done   COVID-19 Vaccine (1 - 2024-25 season) Never done   Flu Shot  06/09/2024   Mammogram  06/27/2024   Medicare Annual Wellness Visit  02/27/2025   DTaP/Tdap/Td vaccine (2 - Td or Tdap) 02/12/2027   Colon Cancer Screening  07/25/2028   DEXA scan (bone density measurement)  Completed   Hepatitis C Screening  Completed   HPV Vaccine  Aged Out   Meningitis B Vaccine  Aged Out    Advanced directives: (Provided) Advance directive discussed with you today. I have provided a copy for you to complete at home and have notarized. Once this is complete, please bring a copy in to our office so we can scan it into your chart.   Next Medicare Annual Wellness Visit scheduled for next year: Yes

## 2024-02-28 NOTE — Progress Notes (Signed)
 Subjective:   Kelli Contreras is a 73 y.o. who presents for a Medicare Wellness preventive visit.  Visit Complete: In person  Persons Participating in Visit: Patient.  AWV Questionnaire: No: Patient Medicare AWV questionnaire was not completed prior to this visit.  Cardiac Risk Factors include: advanced age (>53men, >39 women)     Objective:    Today's Vitals   02/28/24 1146  BP: 118/62  Pulse: 72  SpO2: 98%  Weight: 178 lb 3.2 oz (80.8 kg)  Height: 5\' 3"  (1.6 m)   Body mass index is 31.57 kg/m.     02/28/2024   11:45 AM 02/24/2023   11:21 AM 02/09/2022    2:07 PM 02/27/2020    2:16 PM 01/22/2020    2:35 PM  Advanced Directives  Does Patient Have a Medical Advance Directive? No No No No No  Would patient like information on creating a medical advance directive? Yes (MAU/Ambulatory/Procedural Areas - Information given) No - Patient declined No - Patient declined No - Patient declined Yes (ED - Information included in AVS)    Current Medications (verified) Outpatient Encounter Medications as of 02/28/2024  Medication Sig   amLODipine  (NORVASC ) 2.5 MG tablet Take 1 tablet (2.5 mg total) by mouth daily.   aspirin 81 MG tablet Take 81 mg by mouth at bedtime.    atorvastatin  (LIPITOR) 20 MG tablet Take 1 tablet (20 mg total) by mouth daily.   DULoxetine  (CYMBALTA ) 30 MG capsule Take 1 capsule (30 mg total) by mouth daily.   imipramine  (TOFRANIL ) 50 MG tablet Take 2 tablets (100 mg total) by mouth at bedtime.   metoprolol  succinate (TOPROL -XL) 25 MG 24 hr tablet Take 1 tablet (25 mg total) by mouth at bedtime. Annual appt due in July must see provider for future refills   nitroGLYCERIN  (NITROSTAT ) 0.4 MG SL tablet Place 1 tablet (0.4 mg total) under the tongue every 5 (five) minutes as needed for chest pain.   pantoprazole  (PROTONIX ) 40 MG tablet Take 1 tablet (40 mg total) by mouth daily.   spironolactone  (ALDACTONE ) 50 MG tablet Take 1 tablet (50 mg total) by mouth daily.    SUMAtriptan  (IMITREX ) 100 MG tablet Take 1/2 tab (50mg ) at onset of migraine. May take other half tab in 2 hours if headache persists or recurs.   topiramate  (TOPAMAX ) 50 MG tablet Take 1 tablet (50 mg total) by mouth 2 (two) times daily.   No facility-administered encounter medications on file as of 02/28/2024.    Allergies (verified) Lisinopril  and Other   History: Past Medical History:  Diagnosis Date   Abnormal electrocardiogram (ECG) (EKG) 10/20/2023   Cataract    bil cataracts removed   Chest pressure 08/30/2023   Chronic fatigue 10/19/2023   Chronic migraine without aura without status migrainosus, not intractable 06/07/2016   Depression    Diarrhea    Encounter for general adult medical examination with abnormal findings 02/21/2021   Estrogen deficiency 02/24/2023   Gastroesophageal reflux disease without esophagitis 06/07/2016   Grade I diastolic dysfunction 03/25/2021   Mar 2022     IMPRESSIONS         1. Left ventricular ejection fraction, by estimation, is 60 to 65%. The left ventricle has normal function. The left ventricle has no regional wall motion abnormalities. There is mild left ventricular hypertrophy. Left ventricular diastolic parameters   are consistent with Grade I diastolic dysfunction (impaired relaxation).   2. Right ventricular systolic function   Hyperaldosteronism (HCC) 06/10/2022   Hyperlipidemia  with target LDL less than 130 02/19/2021   The ASCVD Risk score (Arnett DK, et al., 2019) failed to calculate for the following reasons:    The valid total cholesterol range is 130 to 320 mg/dL      Hypertension    IBS (irritable bowel syndrome)    diarrhea   Idiopathic hypotension 10/19/2023   Estimated Creatinine Clearance: 41 mL/min (A) (by C-G formula based on SCr of 1.27 mg/dL (H)).      Insomnia    Lung nodule seen on imaging study 10/20/2021   LVH (left ventricular hypertrophy) due to hypertensive disease, without heart failure 02/19/2021   Migraine     Osteoporosis    Phreesia 06/11/2020   Plantar fasciitis    Primary hypertension 12/01/2007   Estimated Creatinine Clearance: 49.9 mL/min (by C-G formula based on SCr of 1.05 mg/dL).              Aldosterone     ng/dL    7       Comment: .  Unable to flag abnormal result(s), please refer      to reference range(s) below:  .  Adult Reference Ranges for Aldosterone, LC/MS/MS:      Upright  8:00 - 10:00 am    < or = 28 ng/dL      Upright  4:09 -  8:11 pm    < or = 21 ng/dL      Supine    Senile purpura (HCC) 06/03/2022   SOB (shortness of breath) 08/30/2023   Splenic artery aneurysm (HCC) 10/30/2021   Stage 3a chronic kidney disease (HCC) 02/19/2021   Estimated Creatinine Clearance: 58.6 mL/min (by C-G formula based on SCr of 0.9 mg/dL).     Estimated Creatinine Clearance: 55.5 mL/min (by C-G formula based on SCr of 0.93 mg/dL).      Estimated Creatinine Clearance: 48 mL/min (by C-G formula based on SCr of 1.08 mg/dL).      Stage 3b chronic kidney disease (HCC) 10/20/2023   Past Surgical History:  Procedure Laterality Date   ABDOMINAL HYSTERECTOMY     DUB; ovaries intact.   BREAST CYST ASPIRATION Bilateral    BUNIONECTOMY  2007   Dr. Ronda Cocks   EYE SURGERY N/A    Phreesia 06/11/2020   VESICOVAGINAL FISTULA CLOSURE W/ TAH  1985   Dr. Ola Berger   Family History  Problem Relation Age of Onset   CAD Father 59   Cancer Father        bladder cancer   Diabetes Father    Heart disease Father    Hyperlipidemia Father    Hypertension Father    Valvular heart disease Mother        MVP   Heart disease Mother    Hyperlipidemia Mother    Hypertension Mother    Hypertension Brother    Hypertension Other        mother, father, brother   Breast cancer Maternal Aunt    Colon cancer Neg Hx    Esophageal cancer Neg Hx    Rectal cancer Neg Hx    Stomach cancer Neg Hx    Social History   Socioeconomic History   Marital status: Widowed    Spouse name: Not on file   Number of children: 2    Years of education: Not on file   Highest education level: 12th grade  Occupational History   Not on file  Tobacco Use   Smoking status: Never    Passive exposure:  Never   Smokeless tobacco: Never  Vaping Use   Vaping status: Never Used  Substance and Sexual Activity   Alcohol use: No   Drug use: Never   Sexual activity: Not Currently    Partners: Male  Other Topics Concern   Not on file  Social History Narrative         Social Drivers of Health   Financial Resource Strain: Patient Declined (02/28/2024)   Overall Financial Resource Strain (CARDIA)    Difficulty of Paying Living Expenses: Patient declined  Food Insecurity: No Food Insecurity (02/28/2024)   Hunger Vital Sign    Worried About Running Out of Food in the Last Year: Never true    Ran Out of Food in the Last Year: Never true  Transportation Needs: No Transportation Needs (02/28/2024)   PRAPARE - Administrator, Civil Service (Medical): No    Lack of Transportation (Non-Medical): No  Physical Activity: Insufficiently Active (02/28/2024)   Exercise Vital Sign    Days of Exercise per Week: 3 days    Minutes of Exercise per Session: 30 min  Stress: No Stress Concern Present (02/28/2024)   [employer] of Occupational Health - Occupational Stress Questionnaire    Feeling of Stress : Not at all  Social Connections: Moderately Isolated (02/28/2024)   Social Connection and Isolation Panel [NHANES]    Frequency of Communication with Friends and Family: More than three times a week    Frequency of Social Gatherings with Friends and Family: Twice a week    Attends Religious Services: Never    Database administrator or Organizations: Yes    Attends Engineer, structural: More than 4 times per year    Marital Status: Widowed    Tobacco Counseling Counseling given: No    Clinical Intake:  Pre-visit preparation completed: Yes  Pain : No/denies pain     BMI - recorded: 31.57 Nutritional  Risks: None Diabetes: No  Lab Results  Component Value Date   HGBA1C 5.4 02/18/2015     How often do you need to have someone help you when you read instructions, pamphlets, or other written materials from your doctor or pharmacy?: 1 - Never  Interpreter Needed?: No  Information entered by :: Kandy Orris, CMA   Activities of Daily Living     02/28/2024   11:50 AM  In your present state of health, do you have any difficulty performing the following activities:  Hearing? 0  Vision? 0  Difficulty concentrating or making decisions? 0  Walking or climbing stairs? 0  Dressing or bathing? 0  Doing errands, shopping? 0  Preparing Food and eating ? N  Using the Toilet? N  In the past six months, have you accidently leaked urine? Y  Comment wears a pantyliner  Do you have problems with loss of bowel control? N  Managing your Medications? N  Managing your Finances? N  Housekeeping or managing your Housekeeping? N    Patient Care Team: Arcadio Knuckles, MD as PCP - General (Internal Medicine)  Indicate any recent Medical Services you may have received from other than [facility] providers in the past year (date may be approximate).     Assessment:   This is a routine wellness examination for Kelli Contreras.  Hearing/Vision screen Hearing Screening - Comments:: Denies hearing difficulties   Vision Screening - Comments:: Wears rx glasses - up to date with routine eye exams with Dr Daughtry in [place], [place]   Goals Addressed  This Visit's Progress     Patient Stated (pt-stated)        Patient stated she plans to stay active.       Depression Screen     02/28/2024   11:53 AM 08/30/2023   10:15 AM 02/24/2023   10:45 AM 02/09/2022    2:08 PM 09/24/2021   10:33 AM 08/13/2020   10:36 AM 06/21/2020   11:40 AM  PHQ 2/9 Scores  PHQ - 2 Score 0 0 0 0 0 0 0  PHQ- 9 Score 0  0        Fall Risk     02/28/2024   11:50 AM 08/30/2023   10:14 AM 02/24/2023   10:45 AM  02/09/2022    2:08 PM 09/24/2021   10:33 AM  Fall Risk   Falls in the past year? 1 0 0 0 1  Number falls in past yr: 1 0 0 0 1  Injury with Fall? 0 0 0 0 0  Risk for fall due to : History of fall(s) No Fall Risks No Fall Risks  History of fall(s)  Follow up Falls evaluation completed;Falls prevention discussed Falls evaluation completed Falls evaluation completed Falls evaluation completed Falls evaluation completed    MEDICARE RISK AT HOME:  Medicare Risk at Home Any stairs in or around the home?: No If so, are there any without handrails?: No Home free of loose throw rugs in walkways, pet beds, electrical cords, etc?: Yes Adequate lighting in your home to reduce risk of falls?: Yes Life alert?: No Use of a cane, walker or w/c?: No Grab bars in the bathroom?: Yes Shower chair or bench in shower?: Yes Elevated toilet seat or a handicapped toilet?: No  TIMED UP AND GO:  Was the test performed?  No  Cognitive Function: 6CIT completed        02/28/2024   11:51 AM 02/24/2023   10:59 AM 01/22/2020    2:35 PM  6CIT Screen  What Year? 0 points 0 points 0 points  What month? 0 points 0 points 0 points  What time? 0 points 0 points 0 points  Count back from 20 0 points 0 points 0 points  Months in reverse 0 points 0 points 0 points  Repeat phrase 2 points 0 points 4 points  Total Score 2 points 0 points 4 points    Immunizations Immunization History  Administered Date(s) Administered   Tdap 02/11/2017    Screening Tests Health Maintenance  Topic Date Due   Zoster Vaccines- Shingrix (1 of 2) Never done   COVID-19 Vaccine (1 - 2024-25 season) 03/15/2024 (Originally 07/11/2023)   Pneumonia Vaccine 7+ Years old (1 of 2 - PCV) 02/27/2025 (Originally 10/20/1970)   INFLUENZA VACCINE  06/09/2024   MAMMOGRAM  06/27/2024   Medicare Annual Wellness (AWV)  02/27/2025   DTaP/Tdap/Td (2 - Td or Tdap) 02/12/2027   Colonoscopy  07/25/2028   DEXA SCAN  Completed   Hepatitis C Screening   Completed   HPV VACCINES  Aged Out   Meningococcal B Vaccine  Aged Out    Health Maintenance  Health Maintenance Due  Topic Date Due   Zoster Vaccines- Shingrix (1 of 2) Never done   Health Maintenance Items Addressed:  Mammogram ordered  Shingles status: Pt stated she's had the vaccines - advised to obtain the immunization report from local pharmacy to update records  Additional Screening:  Vision Screening: Recommended annual ophthalmology exams for early detection of glaucoma and other disorders of  the eye.  Pt states sees Dr Myrtie Atkinson Daughtry w/[facility] in [place], [place] for routine eye exams.  Dental Screening: Recommended annual dental exams for proper oral hygiene  Community Resource Referral / Chronic Care Management: CRR required this visit?  No   CCM required this visit?  No     Plan:     I have personally reviewed and noted the following in the patient's chart:   Medical and social history Use of alcohol, tobacco or illicit drugs  Current medications and supplements including opioid prescriptions. Patient is not currently taking opioid prescriptions. Functional ability and status Nutritional status Physical activity Advanced directives List of other physicians Hospitalizations, surgeries, and ER visits in previous 12 months Vitals Screenings to include cognitive, depression, and falls Referrals and appointments  In addition, I have reviewed and discussed with patient certain preventive protocols, quality metrics, and best practice recommendations. A written personalized care plan for preventive services as well as general preventive health recommendations were provided to patient.     Patria Bookbinder, CMA   02/28/2024   After Visit Summary: (In Person-Declined) Patient declined AVS at this time.  Notes: Nothing significant to report at this time.

## 2024-03-16 ENCOUNTER — Other Ambulatory Visit: Payer: Self-pay | Admitting: Internal Medicine

## 2024-03-16 DIAGNOSIS — G43709 Chronic migraine without aura, not intractable, without status migrainosus: Secondary | ICD-10-CM

## 2024-03-24 ENCOUNTER — Ambulatory Visit

## 2024-03-24 ENCOUNTER — Ambulatory Visit: Admitting: Cardiology

## 2024-04-10 ENCOUNTER — Encounter: Payer: Self-pay | Admitting: Internal Medicine

## 2024-04-10 ENCOUNTER — Ambulatory Visit: Admitting: Internal Medicine

## 2024-04-10 VITALS — BP 102/76 | HR 85 | Temp 98.4°F | Resp 16 | Ht 63.0 in | Wt 174.6 lb

## 2024-04-10 DIAGNOSIS — R9431 Abnormal electrocardiogram [ECG] [EKG]: Secondary | ICD-10-CM | POA: Diagnosis not present

## 2024-04-10 DIAGNOSIS — R519 Headache, unspecified: Secondary | ICD-10-CM | POA: Insufficient documentation

## 2024-04-10 DIAGNOSIS — N1832 Chronic kidney disease, stage 3b: Secondary | ICD-10-CM | POA: Diagnosis not present

## 2024-04-10 DIAGNOSIS — Z0001 Encounter for general adult medical examination with abnormal findings: Secondary | ICD-10-CM

## 2024-04-10 DIAGNOSIS — I959 Hypotension, unspecified: Secondary | ICD-10-CM

## 2024-04-10 DIAGNOSIS — R531 Weakness: Secondary | ICD-10-CM | POA: Diagnosis not present

## 2024-04-10 LAB — SEDIMENTATION RATE: Sed Rate: 6 mm/h (ref 0–30)

## 2024-04-10 LAB — URINALYSIS, ROUTINE W REFLEX MICROSCOPIC
Hgb urine dipstick: NEGATIVE
Nitrite: NEGATIVE
Specific Gravity, Urine: >=1.03 — AB (ref 1.000–1.030)
Total Protein, Urine: 30 — AB
Urine Glucose: NEGATIVE
Urobilinogen, UA: 0.2 (ref 0.0–1.0)
pH: 6 (ref 5.0–8.0)

## 2024-04-10 LAB — C-REACTIVE PROTEIN: CRP: <1 mg/dL (ref 0.5–20.0)

## 2024-04-10 LAB — BASIC METABOLIC PANEL WITH GFR
BUN: 18 mg/dL (ref 6–23)
CO2: 26 meq/L (ref 19–32)
Calcium: 9.6 mg/dL (ref 8.4–10.5)
Chloride: 105 meq/L (ref 96–112)
Creatinine, Ser: 1.25 mg/dL — ABNORMAL HIGH (ref 0.40–1.20)
GFR: 43.68 mL/min — ABNORMAL LOW (ref >60.00)
Glucose, Bld: 91 mg/dL (ref 70–99)
Potassium: 4.6 meq/L (ref 3.5–5.1)
Sodium: 138 meq/L (ref 135–145)

## 2024-04-10 LAB — HEPATIC FUNCTION PANEL
ALT: 13 U/L (ref 0–35)
AST: 19 U/L (ref 0–37)
Albumin: 4.3 g/dL (ref 3.5–5.2)
Alkaline Phosphatase: 89 U/L (ref 39–117)
Bilirubin, Direct: 0.2 mg/dL (ref 0.0–0.3)
Total Bilirubin: 0.5 mg/dL (ref 0.2–1.2)
Total Protein: 7.3 g/dL (ref 6.0–8.3)

## 2024-04-10 LAB — TSH: TSH: 1.45 u[IU]/mL (ref 0.35–5.50)

## 2024-04-10 LAB — BRAIN NATRIURETIC PEPTIDE: Pro B Natriuretic peptide (BNP): 12 pg/mL (ref 0.0–100.0)

## 2024-04-10 LAB — CORTISOL: Cortisol, Plasma: 8.7 ug/dL

## 2024-04-10 NOTE — Progress Notes (Signed)
 Subjective:  Patient ID: Kelli Contreras, female    DOB: 03-20-1951  Age: 73 y.o. MRN: [id number]  CC: Medical Management of Chronic Issues (6-7 month follow up. Patient states that yesterday she didn't feel well. She felt very dizzy and her bp was 77/60. She went to sleep and when she got back up its was 104/81 and her pulse was 111. She states that she feels "Blahh" today. She also feels like something is going on with her head. ) and Annual Exam   HPI Kelli Contreras presents for a CPX and f/up -----  Discussed the use of AI scribe software for clinical note transcription with the patient, who gave verbal consent to proceed.  History of Present Illness   Kelli Contreras is a 73 year old female who presents with dizziness, weakness, and a prolonged migraine.  Over the past few weeks, she has experienced a significant migraine that occurred last week and lasted several days. The headache was severe, located posteriorly, and differed from her usual migraines, which have improved over time. She took Excedrin Migraine multiple times, which eventually provided relief. During the migraine episode, she experienced nausea, vomiting, and diarrhea, which is unusual for her.  On Sunday, she felt unwell and checked her blood pressure, which she does not do often. Initially, her blood pressure was normal, but later she felt dizzy and weak, prompting her to check it again. At that time, it was 77/60-65 mmHg. After resting and sleeping, her blood pressure returned to her usual levels by the afternoon.  She feels weak all over but has no numbness, tingling, trouble walking or talking, or swelling in her legs or feet. No vision changes associated with the headache. No current nausea, vomiting, or diarrhea. She experienced dizziness and weakness during the episode of low blood pressure.       Outpatient Medications Prior to Visit  Medication Sig Dispense Refill   amLODipine  (NORVASC ) 2.5 MG tablet Take 1  tablet (2.5 mg total) by mouth daily. 90 tablet 3   aspirin 81 MG tablet Take 81 mg by mouth at bedtime.      atorvastatin  (LIPITOR) 20 MG tablet Take 1 tablet (20 mg total) by mouth daily. 90 tablet 0   DULoxetine  (CYMBALTA ) 30 MG capsule Take 1 capsule (30 mg total) by mouth daily. 90 capsule 0   imipramine  (TOFRANIL ) 50 MG tablet Take 2 tablets (100 mg total) by mouth at bedtime. 180 tablet 0   metoprolol  succinate (TOPROL -XL) 25 MG 24 hr tablet Take 1 tablet (25 mg total) by mouth at bedtime. Annual appt due in July must see provider for future refills 90 tablet 0   nitroGLYCERIN  (NITROSTAT ) 0.4 MG SL tablet Place 1 tablet (0.4 mg total) under the tongue every 5 (five) minutes as needed for chest pain. 25 tablet 1   pantoprazole  (PROTONIX ) 40 MG tablet Take 1 tablet (40 mg total) by mouth daily. 90 tablet 0   spironolactone  (ALDACTONE ) 50 MG tablet Take 1 tablet (50 mg total) by mouth daily. 90 tablet 0   SUMAtriptan  (IMITREX ) 100 MG tablet Take 1/2 tab (50mg ) at onset of migraine. May take other half tab in 2 hours if headache persists or recurs. 9 tablet 4   topiramate  (TOPAMAX ) 50 MG tablet Take 1 tablet (50 mg total) by mouth 2 (two) times daily. 180 tablet 0   No facility-administered medications prior to visit.    ROS Review of Systems  Constitutional:  Negative for appetite change,  chills, diaphoresis, fatigue and fever.  HENT: Negative.    Eyes:  Negative for visual disturbance.  Respiratory: Negative.  Negative for chest tightness, shortness of breath and wheezing.   Cardiovascular:  Negative for chest pain, palpitations and leg swelling.  Gastrointestinal:  Positive for nausea and vomiting. Negative for abdominal pain, constipation and diarrhea.  Genitourinary: Negative.  Negative for difficulty urinating.  Musculoskeletal:  Positive for arthralgias. Negative for myalgias.  Skin: Negative.  Negative for rash.  Neurological:  Positive for dizziness, weakness, light-headedness  and headaches.  Hematological:  Negative for adenopathy. Does not bruise/bleed easily.  Psychiatric/Behavioral: Negative.      Objective:  BP 102/76 (BP Location: Left Arm, Patient Position: Sitting, Cuff Size: Normal)   Pulse 85   Temp 98.4 F (36.9 C) (Oral)   Resp 16   Ht 5\' 3"  (1.6 m)   Wt 174 lb 9.6 oz (79.2 kg)   SpO2 95%   BMI 30.93 kg/m   BP Readings from Last 3 Encounters:  04/10/24 102/76  02/28/24 118/62  11/29/23 124/82    Wt Readings from Last 3 Encounters:  04/10/24 174 lb 9.6 oz (79.2 kg)  02/28/24 178 lb 3.2 oz (80.8 kg)  11/29/23 177 lb 3.2 oz (80.4 kg)    Physical Exam Vitals reviewed.  Constitutional:      Appearance: Normal appearance. She is not ill-appearing.  HENT:     Nose: Nose normal.     Mouth/Throat:     Mouth: Mucous membranes are moist.  Eyes:     General: No scleral icterus.    Extraocular Movements: Extraocular movements intact.     Pupils: Pupils are equal, round, and reactive to light.  Cardiovascular:     Rate and Rhythm: Normal rate and regular rhythm.     Heart sounds: Normal heart sounds, S1 normal and S2 normal. No murmur heard.    No friction rub. No gallop.     Comments: EKG--- NSR, 78 bpm LAD Anterior/inferior patterns are unchanged  Pulmonary:     Breath sounds: No stridor. No wheezing, rhonchi or rales.  Abdominal:     General: Abdomen is flat.     Palpations: There is no mass.     Tenderness: There is no abdominal tenderness. There is no guarding.     Hernia: No hernia is present.  Musculoskeletal:     Cervical back: Neck supple.     Right lower leg: No edema.     Left lower leg: No edema.  Lymphadenopathy:     Cervical: No cervical adenopathy.  Skin:    General: Skin is warm and dry.     Findings: No rash.  Neurological:     General: No focal deficit present.     Mental Status: She is alert.     Cranial Nerves: Cranial nerves 2-12 are intact. No cranial nerve deficit.     Sensory: Sensation is intact.  No sensory deficit.     Motor: Weakness present. No tremor, atrophy, abnormal muscle tone, seizure activity or pronator drift.     Coordination: Coordination is intact. Coordination normal.     Gait: Gait is intact. Gait and tandem walk normal.     Deep Tendon Reflexes: Reflexes normal. Babinski sign absent on the right side. Babinski sign absent on the left side.     Reflex Scores:      Tricep reflexes are 1+ on the right side and 1+ on the left side.      Bicep reflexes are  1+ on the right side and 1+ on the left side.      Brachioradialis reflexes are 1+ on the right side and 1+ on the left side.      Patellar reflexes are 2+ on the right side and 2+ on the left side.      Achilles reflexes are 1+ on the right side and 1+ on the left side.    Comments: She is diffusely weak  Psychiatric:        Mood and Affect: Mood normal.        Behavior: Behavior normal.     Lab Results  Component Value Date   WBC 6.9 10/19/2023   HGB 14.8 10/19/2023   HCT 44.5 10/19/2023   PLT 193.0 10/19/2023   GLUCOSE 91 04/10/2024   CHOL 128 11/29/2023   TRIG 68 11/29/2023   HDL 46 11/29/2023   LDLCALC 68 11/29/2023   ALT 13 04/10/2024   AST 19 04/10/2024   NA 138 04/10/2024   K 4.6 04/10/2024   CL 105 04/10/2024   CREATININE 1.25 (H) 04/10/2024   BUN 18 04/10/2024   CO2 26 04/10/2024   TSH 1.45 04/10/2024   INR 1.0 06/03/2022   HGBA1C 5.4 02/18/2015    CT CORONARY FFR DATA PREP & FLUID ANALYSIS Result Date: 10/27/2023 EXAM: CT FFR ANALYSIS CLINICAL DATA:  Abnormal CCTA FINDINGS: FFRct analysis was performed on the original cardiac CT angiogram dataset. Diagrammatic representation of the FFRct analysis is provided in a separate PDF document in PACS. This dictation was created using the PDF document and an interactive 3D model of the results. 3D model is not available in the EMR/PACS. Normal FFR range is >0.80. 1. Left Main:  No significant stenosis. 2. LAD: No significant stenosis.  FFRct 0.87 3.  LCX: No significant stenosis.  FFRct 0.97 4. RCA: No significant stenosis.  FFRct 0.98 IMPRESSION: 1.  CT FFR analysis didn't show any significant stenosis. Electronically Signed   By: Constancia Delton M.D.   On: 10/27/2023 16:51   CT CORONARY MORPH W/CTA COR W/SCORE W/CA W/CM &/OR WO/CM Addendum Date: 10/27/2023 ADDENDUM REPORT: 10/27/2023 14:36 EXAM: OVER-READ INTERPRETATION  CT CHEST The following report is an over-read performed by radiologist [facility]. [facility] on 10/27/2023. This over-read does not include interpretation of cardiac or coronary anatomy or pathology. The coronary CTA interpretation by the cardiologist is attached. COMPARISON:  Chest CT 03/31/2023 FINDINGS: Normal caliber thoracic aorta with scattered atherosclerotic calcifications. Calcifications also noted around the aortic valve. No mediastinal or hilar mass lymphadenopathy. The visualized esophagus is grossly. Mild dependent bibasilar subsegmental atelectasis but no pulmonary lesions or pulmonary nodules. The central tracheobronchial tree is unremarkable. No significant upper abdominal findings. IMPRESSION: 1. No significant extracardiac findings. 2. Aortic atherosclerosis. Aortic Atherosclerosis (ICD10-I70.0). Electronically Signed   By: Marrian Siva M.D.   On: 10/27/2023 14:36   Result Date: 10/27/2023 CLINICAL DATA:  Chest pain, shortness of breath EXAM: Cardiac/Coronary  CTA TECHNIQUE: The patient was scanned on a Siemens Somatom go.Top scanner. : A retrospective scan was triggered in the ascending thoracic aorta. Axial non-contrast 3 mm slices were carried out through the heart. The data set was analyzed on a dedicated work station and scored using the Agatson method. Gantry rotation speed was 330 msecs and collimation was .6 mm. 25 mg of metoprolol , 15mg  ivabradine  and 0.8 mg of sl NTG was given. The 3D data set was reconstructed in 5% intervals of the 60-95 % of the R-R cycle.  Diastolic phases were  analyzed on a dedicated work station using MPR, MIP and VRT modes. The patient received 80 cc of contrast. FINDINGS: Aorta: Normal size. Mild aortic wall calcifications. No dissection. Aortic Valve:  Trileaflet.  No calcifications. Coronary Arteries:  Normal coronary origin.  Right dominance. RCA is a dominant artery. There is no plaque. Left main gives rise to LAD and LCX arteries. LM has no disease. LAD has calcified plaque proximally causing mild stenosis (25-49%). Non calcified plaque in the mid LAD causing moderate stenosis (50-69%). LCX is a non-dominant artery.  There is no plaque. Other findings: Normal pulmonary vein drainage into the left atrium. Normal left atrial appendage without a thrombus. Normal size of the pulmonary artery. IMPRESSION: 1. Coronary calcium  score of 148. This was 77th percentile for age and sex matched control. 2. Normal coronary origin with right dominance. 3. Moderate mid LAD stenosis (50%). mild proximal LAD stenosis (25%) 4. CAD-RADS 3. Moderate stenosis. Consider symptom-guided anti-ischemic pharmacotherapy as well as risk factor modification per guideline directed care. 5. Additional analysis with CT FFR will be submitted and reported separately. Electronically Signed: By: Constancia Delton M.D. On: 10/27/2023 13:16    Assessment & Plan:  Stage 3b chronic kidney disease (HCC) -     Basic metabolic panel with GFR; Future -     Urinalysis, Routine w reflex microscopic; Future -     Cystatin C with Glomerular Filtration Rate, Estimated (eGFR); Future  Hypotension, unspecified hypotension type- She will hold the CCB and spironolactone . -     EKG 12-Lead -     TSH; Future -     Cortisol; Future -     Hepatic function panel; Future  Acute intractable headache, unspecified headache type- ESR and CRP are not elevated so TA is unlikely. Will evaluate for CVA, mass, bleed, SDH, NPH. -     Sedimentation rate; Future -     C-reactive protein; Future -     MR BRAIN WO  CONTRAST; Future  Weakness -     Acetylcholine receptor, binding; Future -     Striated muscle antibody; Future -     Hepatic function panel; Future -     MR BRAIN WO CONTRAST; Future  Abnormal EKG -     Brain natriuretic peptide; Future  Encounter for general adult medical examination with abnormal findings     Follow-up: Return in about 3 weeks (around 05/01/2024).  Sandra Crouch, MD

## 2024-04-10 NOTE — Patient Instructions (Signed)
 Chronic Kidney Disease in Adults: What to Know Chronic kidney disease (CKD) is when lasting damage happens to the kidneys slowly over time. The kidneys are two organs that do many important things in the body. These include: Taking waste and extra fluid out of the blood to make pee (urine). Making hormones. Keeping the right amount of fluids and chemicals in the body. A small amount of kidney damage may not cause problems. You must take steps to help keep the kidney damage from getting worse. A lot of damage may cause kidney failure. Kidney failure means the kidneys can no longer work right. What are the causes? Diabetes. High blood pressure. Diseases that affect the heart and blood vessels. Other kidney diseases. Diseases that affect the body's defense system (immune system). A problem with the flow of pee. This may be caused by: Kidney stones. Cancer. An enlarged prostate, in males. A kidney infection or urinary tract infection (UTI) that keeps coming back. What increases the risk? Getting older. The chances of having CKD increase with age. A family history of kidney disease or kidney failure. Having a disease caused by genes. Taking medicines that can harm the kidneys. Being near or having contact with harmful substances. Being very overweight. Using tobacco now or in the past. What are the signs or symptoms? Common symptoms of CKD include: Feeling very tired and having less energy. Swelling of the face, legs, ankles, or feet. Throwing up or feeling like you may throw up. Not wanting to eat as much as normal. Being confused or not able to focus. Twitches and cramps in the leg muscles or other muscles. Dry, itchy skin. Other symptoms may include: Shortness of breath. Trouble sleeping. Making less pee, or making more pee, especially at night. A taste of metal in your mouth. You may also become anemic. Anemia means there's not enough red blood cells in your blood. You may get  symptoms slowly. You may not notice them until the kidney damage gets very bad. How is this diagnosed? CKD may be diagnosed based on: Tests on your blood or pee. Imaging tests, like an ultrasound or a CT scan. A kidney biopsy. For this test, a sample of kidney tissue is removed to be looked at under a microscope. These tests will help to find out how serious the CKD is. How is this treated? Often, there's no cure for CKD. Treatment can help with symptoms and help keep the disease from getting worse. Treatment may include: Treating other problems that are causing your CKD or making it worse. Diet changes. You may need to: Avoid alcohol. Avoid foods that are high in salt, potassium, phosphorous, and protein. Taking medicines for symptoms and to help control other conditions. Dialysis. This treatment gets harmful waste out of your body. It may be needed if you have kidney failure. Follow these instructions at home: Medicines Take your medicines only as told. The amount of some medicines you take may need to be changed. Do not take any new medicines, vitamins, or supplements unless your health care provider says it's okay. These may make kidney damage worse. Lifestyle Do not smoke, vape, or use nicotine or tobacco. If you drink alcohol: Limit how much you have to: 0-1 drink a day if you're female. 0-2 drinks a day if you're female. Know how much alcohol is in your drink. In the [place] one drink is one 12 oz bottle of beer (355 mL), one 5 oz glass of wine (148 mL), or one 1 oz  glass of hard liquor (44 mL). Stay at a healthy weight. If you need help, ask your provider. General instructions  Eat and drink as told. Track your blood pressure at home. Tell your provider about any changes. If you have diabetes, track your blood sugar as told. Exercise at least 30 minutes a day, 5 days a week. Keep your shots (vaccinations) up to date. Keep all follow-up visits. Your provider may need to change  your treatments over time. Where to find support American Kid[employer]: [place].com.[employer] Kidney School: kidneyschool.org American Association of Kidney Patients: https://www.miller-montoya.com/ Where to find more information National Kidney Foundation: kidney.org Centers for Disease Control and Prevention. To learn more: Go to DiningCalendar.de. Click "Search". Type "chronic kidney disease" in the search box. Contact a health care provider if: You have new symptoms. You get symptoms of end-stage kidney disease. These include: Headaches. Numbness in your hands or feet. Leg cramps. Easy bruising. Get help right away if: You have a fever. You make less pee than usual. You have pain or bleeding when you pee or poop. You have chest pain. You have shortness of breath. These symptoms may be an emergency. Call 911 right away. Do not wait to see if the symptoms will go away. Do not drive yourself to the hospital. This information is not intended to replace advice given to you by your health care provider. Make sure you discuss any questions you have with your health care provider. Document Revised: 09/07/2023 Document Reviewed: 04/30/2023 Elsevier Patient Education  2024 [employer].

## 2024-04-14 LAB — CYSTATIN C WITH GLOMERULAR FILTRATION RATE, ESTIMATED (EGFR)
CYSTATIN C: 1.62 mg/L — ABNORMAL HIGH (ref 0.52–1.10)
eGFR: 37 mL/min/{1.73_m2} — ABNORMAL LOW (ref >=60)

## 2024-04-14 LAB — ACETYLCHOLINE RECEPTOR, BINDING: A CHR BINDING ABS: <0.3 nmol/L

## 2024-04-14 LAB — STRIATED MUSCLE ANTIBODY: STRIATED MUSCLE AB SCREEN: NEGATIVE

## 2024-04-15 ENCOUNTER — Ambulatory Visit
Admission: RE | Admit: 2024-04-15 | Discharge: 2024-04-15 | Disposition: A | Discharge status: Home / Self Care | Source: Ambulatory Visit | Attending: Internal Medicine | Admitting: Internal Medicine

## 2024-04-15 ENCOUNTER — Ambulatory Visit: Payer: Self-pay | Admitting: Internal Medicine

## 2024-04-15 DIAGNOSIS — R519 Headache, unspecified: Secondary | ICD-10-CM

## 2024-04-15 DIAGNOSIS — R531 Weakness: Secondary | ICD-10-CM | POA: Diagnosis not present

## 2024-04-18 ENCOUNTER — Telehealth: Payer: Self-pay | Admitting: Internal Medicine

## 2024-04-18 NOTE — Telephone Encounter (Signed)
 Copied from CRM [id number]. Topic: Clinical - Medical Advice >> Apr 18, 2024  3:08 PM Kelli Contreras wrote: Reason for CRM: Patient called to scheduled 3 week follow up for next available 05/08/24, she was last seen Dr. Rochelle Chu 04/10/24 - says her blood pressure has been low and wants to know what she should do? Yesterday her blood pressure was 88/64 and today 92/72. Please call her at [phone] (H) and if she doesn't answer please call mobile number [phone].  --  Forwarding to DOD

## 2024-04-19 NOTE — Telephone Encounter (Signed)
Spoke with patient, gave a verbal understanding °

## 2024-04-19 NOTE — Telephone Encounter (Signed)
 Stop taking amlodipine

## 2024-04-26 ENCOUNTER — Other Ambulatory Visit (HOSPITAL_COMMUNITY)

## 2024-04-26 ENCOUNTER — Other Ambulatory Visit

## 2024-04-26 ENCOUNTER — Other Ambulatory Visit: Payer: Medicare PPO

## 2024-05-08 ENCOUNTER — Ambulatory Visit: Payer: Self-pay | Admitting: Internal Medicine

## 2024-05-08 ENCOUNTER — Ambulatory Visit: Admitting: Internal Medicine

## 2024-05-08 ENCOUNTER — Encounter: Payer: Self-pay | Admitting: Internal Medicine

## 2024-05-08 VITALS — BP 114/84 | HR 77 | Temp 97.8°F | Resp 16 | Ht 63.0 in | Wt 177.8 lb

## 2024-05-08 DIAGNOSIS — K219 Gastro-esophageal reflux disease without esophagitis: Secondary | ICD-10-CM

## 2024-05-08 DIAGNOSIS — E269 Hyperaldosteronism, unspecified: Secondary | ICD-10-CM

## 2024-05-08 DIAGNOSIS — Z0001 Encounter for general adult medical examination with abnormal findings: Secondary | ICD-10-CM

## 2024-05-08 DIAGNOSIS — K582 Mixed irritable bowel syndrome: Secondary | ICD-10-CM

## 2024-05-08 DIAGNOSIS — E785 Hyperlipidemia, unspecified: Secondary | ICD-10-CM | POA: Diagnosis not present

## 2024-05-08 DIAGNOSIS — I10 Essential (primary) hypertension: Secondary | ICD-10-CM | POA: Diagnosis not present

## 2024-05-08 DIAGNOSIS — Z0000 Encounter for general adult medical examination without abnormal findings: Secondary | ICD-10-CM

## 2024-05-08 DIAGNOSIS — G43709 Chronic migraine without aura, not intractable, without status migrainosus: Secondary | ICD-10-CM

## 2024-05-08 LAB — CBC WITH DIFFERENTIAL/PLATELET
Basophils Absolute: 0 10*3/uL (ref 0.0–0.1)
Basophils Relative: 0.2 % (ref 0.0–3.0)
Eosinophils Absolute: 0 10*3/uL (ref 0.0–0.7)
Eosinophils Relative: 0 % (ref 0.0–5.0)
HCT: 41.4 % (ref 36.0–46.0)
Hemoglobin: 14 g/dL (ref 12.0–15.0)
Lymphocytes Relative: 25.3 % (ref 12.0–46.0)
Lymphs Abs: 1.6 10*3/uL (ref 0.7–4.0)
MCHC: 33.8 g/dL (ref 30.0–36.0)
MCV: 88.6 fl (ref 78.0–100.0)
Monocytes Absolute: 0.5 10*3/uL (ref 0.1–1.0)
Monocytes Relative: 7.7 % (ref 3.0–12.0)
Neutro Abs: 4.2 10*3/uL (ref 1.4–7.7)
Neutrophils Relative %: 66.8 % (ref 43.0–77.0)
Platelets: 161 10*3/uL (ref 150.0–400.0)
RBC: 4.67 Mil/uL (ref 3.87–5.11)
RDW: 13.3 % (ref 11.5–15.5)
WBC: 6.3 10*3/uL (ref 4.0–10.5)

## 2024-05-08 MED ORDER — SPIRONOLACTONE 25 MG PO TABS
25.0000 mg | ORAL_TABLET | Freq: Every day | ORAL | 0 refills | Status: DC
Start: 2024-05-08 — End: 2024-05-08

## 2024-05-08 MED ORDER — ATORVASTATIN CALCIUM 20 MG PO TABS
20.0000 mg | ORAL_TABLET | Freq: Every day | ORAL | 0 refills | Status: DC
Start: 2024-05-08 — End: 2024-08-29

## 2024-05-08 MED ORDER — SPIRONOLACTONE 25 MG PO TABS
25.0000 mg | ORAL_TABLET | Freq: Every day | ORAL | 0 refills | Status: DC
Start: 2024-05-08 — End: 2024-09-04

## 2024-05-08 MED ORDER — ONDANSETRON 4 MG PO TBDP: 4.0000 mg | ORAL_TABLET | Freq: Three times a day (TID) | ORAL | 3 refills | Status: AC | PRN

## 2024-05-08 NOTE — Progress Notes (Unsigned)
 Subjective:  Patient ID: Kelli Contreras, female    DOB: 29-Nov-1950  Age: 73 y.o. MRN: [id number]  CC: Hypertension, Annual Exam, and Hyperlipidemia   HPI Kelli Contreras presents for a CPX and f/up ---  Discussed the use of AI scribe software for clinical note transcription with the patient, who gave verbal consent to proceed.  History of Present Illness   Kelli Contreras is a 73 year old female with hypertension and irritable bowel syndrome who presents with low blood pressure and gastrointestinal symptoms.  She has been experiencing low blood pressure with readings ranging from 82/86 to 101, and a single instance of 130/91. She has discontinued amlodipine , previously prescribed for hypertension. She notes an elevated pulse of 105, causing weakness, dizziness, and lightheadedness. No pain, numbness, or tingling is reported.  She describes gastrointestinal symptoms, including vomiting after meals, occurring yesterday after lunch and again this morning after eating grits. She also experienced vomiting on the 18th, accompanied by a headache. Bowel movements are generally normal, but she sometimes experiences diarrhea, especially after consuming a lot of fruits. She has a history of irritable bowel syndrome and has seen a gastroenterologist in the past, who prescribed pantoprazole , taken in the morning, though she feels it is not effective in preventing her current symptoms.  Her weight seems to be increasing, but she denies any recent major changes. No painful swallowing or trouble swallowing. She has not experienced syncope despite feeling unwell recently.  Current medications include pantoprazole  for acid reflux and Imitrex , taken intermittently for headaches.       Outpatient Medications Prior to Visit  Medication Sig Dispense Refill   aspirin 81 MG tablet Take 81 mg by mouth at bedtime.      DULoxetine  (CYMBALTA ) 30 MG capsule Take 1 capsule (30 mg total) by mouth daily. 90 capsule 0    imipramine  (TOFRANIL ) 50 MG tablet Take 2 tablets (100 mg total) by mouth at bedtime. 180 tablet 0   metoprolol  succinate (TOPROL -XL) 25 MG 24 hr tablet Take 1 tablet (25 mg total) by mouth at bedtime. Annual appt due in July must see provider for future refills 90 tablet 0   nitroGLYCERIN  (NITROSTAT ) 0.4 MG SL tablet Place 1 tablet (0.4 mg total) under the tongue every 5 (five) minutes as needed for chest pain. 25 tablet 1   pantoprazole  (PROTONIX ) 40 MG tablet Take 1 tablet (40 mg total) by mouth daily. 90 tablet 0   SUMAtriptan  (IMITREX ) 100 MG tablet Take 1/2 tab (50mg ) at onset of migraine. May take other half tab in 2 hours if headache persists or recurs. 9 tablet 4   topiramate  (TOPAMAX ) 50 MG tablet Take 1 tablet (50 mg total) by mouth 2 (two) times daily. 180 tablet 0   amLODipine  (NORVASC ) 2.5 MG tablet Take 1 tablet (2.5 mg total) by mouth daily. 90 tablet 3   atorvastatin  (LIPITOR) 20 MG tablet Take 1 tablet (20 mg total) by mouth daily. 90 tablet 0   spironolactone  (ALDACTONE ) 50 MG tablet Take 1 tablet (50 mg total) by mouth daily. 90 tablet 0   No facility-administered medications prior to visit.    ROS Review of Systems  Objective:  BP 114/84 (BP Location: Left Arm, Patient Position: Sitting, Cuff Size: Normal)   Pulse 77   Temp 97.8 F (36.6 C) (Oral)   Resp 16   Ht 5' 3 (1.6 m)   Wt 177 lb 12.8 oz (80.6 kg)   SpO2 99%   BMI 31.50  kg/m   BP Readings from Last 3 Encounters:  05/08/24 114/84  04/10/24 102/76  02/28/24 118/62    Wt Readings from Last 3 Encounters:  05/08/24 177 lb 12.8 oz (80.6 kg)  04/10/24 174 lb 9.6 oz (79.2 kg)  02/28/24 178 lb 3.2 oz (80.8 kg)    Physical Exam  Lab Results  Component Value Date   WBC 6.9 10/19/2023   HGB 14.8 10/19/2023   HCT 44.5 10/19/2023   PLT 193.0 10/19/2023   GLUCOSE 91 04/10/2024   CHOL 128 11/29/2023   TRIG 68 11/29/2023   HDL 46 11/29/2023   LDLCALC 68 11/29/2023   ALT 13 04/10/2024   AST 19  04/10/2024   NA 138 04/10/2024   K 4.6 04/10/2024   CL 105 04/10/2024   CREATININE 1.25 (H) 04/10/2024   BUN 18 04/10/2024   CO2 26 04/10/2024   TSH 1.45 04/10/2024   INR 1.0 06/03/2022   HGBA1C 5.4 02/18/2015    MR Brain Wo Contrast Result Date: 04/15/2024 CLINICAL DATA:  73 year old female with headache and weakness. EXAM: MRI HEAD WITHOUT CONTRAST TECHNIQUE: Multiplanar, multiecho pulse sequences of the brain and surrounding structures were obtained without intravenous contrast. COMPARISON:  Head CT 09/01/2023. FINDINGS: Brain: Normal cerebral volume. No restricted diffusion to suggest acute infarction. No midline shift, mass effect, evidence of mass lesion, ventriculomegaly, extra-axial collection or acute intracranial hemorrhage. Cervicomedullary junction and pituitary are within normal limits. Widely scattered cerebral white matter T2 and FLAIR hyperintensity in both hemispheres, including widespread periventricular and subcortical white matter involvement in a nonspecific configuration. The extent is moderate to severe for age. There is a chronic microhemorrhage in the left caudate. But no cortical encephalomalacia or other chronic cerebral blood products are identified (T2 *). And otherwise the deep gray nuclei, brainstem and cerebellum appear normal for age. Vascular: Major intracranial vascular flow voids are preserved. Skull and upper cervical spine: Visualized bone marrow signal is within normal limits. Negative visible cervical spine. Sinuses/Orbits: Postoperative changes to both globes. Paranasal Visualized paranasal sinuses and mastoids are stable and well aerated. Other: Grossly normal visible internal auditory structures. Negative visible scalp and face. IMPRESSION: 1. No acute intracranial abnormality. 2. At least moderately advanced for age but nonspecific cerebral white matter signal changes. These are most commonly due to chronic small vessel disease, and there is a solitary chronic  microhemorrhage also in the left caudate nucleus. Electronically Signed   By: VEAR Hurst M.D.   On: 04/15/2024 12:50    Assessment & Plan:  Primary hypertension -     AMB Referral VBCI Care Management -     CBC with Differential/Platelet; Future  Hyperlipidemia with target LDL less than 130 -     Atorvastatin  Calcium ; Take 1 tablet (20 mg total) by mouth daily.  Dispense: 90 tablet; Refill: 0  Irritable bowel syndrome with both constipation and diarrhea -     Ondansetron ; Take 1 tablet (4 mg total) by mouth every 8 (eight) hours as needed for nausea or vomiting.  Dispense: 40 tablet; Refill: 3  Hyperaldosteronism (HCC) -     Spironolactone ; Take 1 tablet (25 mg total) by mouth daily.  Dispense: 90 tablet; Refill: 0 -     AMB Referral VBCI Care Management  Chronic migraine without aura without status migrainosus, not intractable -     Ondansetron ; Take 1 tablet (4 mg total) by mouth every 8 (eight) hours as needed for nausea or vomiting.  Dispense: 40 tablet; Refill: 3 -  AMB Referral VBCI Care Management  Gastroesophageal reflux disease without esophagitis -     CBC with Differential/Platelet; Future     Follow-up: Return in about 3 months (around 08/08/2024).  Debby Molt, MD

## 2024-05-08 NOTE — Patient Instructions (Signed)

## 2024-05-14 ENCOUNTER — Other Ambulatory Visit: Payer: Self-pay | Admitting: Internal Medicine

## 2024-05-14 DIAGNOSIS — I119 Hypertensive heart disease without heart failure: Secondary | ICD-10-CM

## 2024-05-14 DIAGNOSIS — G43709 Chronic migraine without aura, not intractable, without status migrainosus: Secondary | ICD-10-CM

## 2024-05-14 DIAGNOSIS — I10 Essential (primary) hypertension: Secondary | ICD-10-CM

## 2024-05-14 DIAGNOSIS — K219 Gastro-esophageal reflux disease without esophagitis: Secondary | ICD-10-CM

## 2024-05-17 ENCOUNTER — Telehealth: Payer: Self-pay | Admitting: *Deleted

## 2024-05-17 NOTE — Progress Notes (Signed)
 Care Guide Pharmacy Note  05/17/2024 Name: Kelli Contreras MRN: [id number] DOB: 08-10-1951  Referred By: Joshua Debby CROME, MD Reason for referral: Call Attempt #1 and Complex Care Management (Outreach to schedule referral with pharmacist )   Kelli Contreras is a 73 y.o. year old female who is a primary care patient of Joshua Debby CROME, MD.  Kelli Contreras was referred to the pharmacist for assistance related to: HTN  An unsuccessful telephone outreach was attempted today to contact the patient who was referred to the pharmacy team for assistance with medication management. Additional attempts will be made to contact the patient.  Thedford Franks, CMA [place]  [facility], [facility] Guide Direct Dial: [phone]  Fax: [phone] Website: [email or web address]

## 2024-05-18 NOTE — Progress Notes (Signed)
 Care Guide Pharmacy Note  05/18/2024 Name: Kelli Contreras MRN: [id number] DOB: 07-05-1951  Referred By: Joshua Debby CROME, MD Reason for referral: Call Attempt #1 and Complex Care Management (Outreach to schedule referral with pharmacist )   Kelli Contreras is a 73 y.o. year old female who is a primary care patient of Joshua Debby CROME, MD.  Kelli Contreras was referred to the pharmacist for assistance related to: HTN  A second unsuccessful telephone outreach was attempted today to contact the patient who was referred to the pharmacy team for assistance with medication management. Additional attempts will be made to contact the patient.  Thedford Franks, CMA, Care Guide [facility]  [facility], [facility] Guide Direct Dial: [phone]  Fax: [phone] Website: [email or web address]

## 2024-05-19 NOTE — Progress Notes (Signed)
 Care Guide Pharmacy Note  05/19/2024 Name: Kelli Contreras MRN: [id number] DOB: Dec 24, 1950  Referred By: Joshua Debby CROME, MD Reason for referral: Call Attempt #1 and Complex Care Management (Outreach to schedule referral with pharmacist )   Kelli Contreras is a 73 y.o. year old female who is a primary care patient of Joshua Debby CROME, MD.  [name] was referred to the pharmacist for assistance related to: HTN  Successful contact was made with the patient to discuss pharmacy services including being ready for the pharmacist to call at least 5 minutes before the scheduled appointment time and to have medication bottles and any blood pressure readings ready for review. The patient agreed to meet with the pharmacist via telephone visit on 05/31/2024  Thedford Franks, CMA [place]  [facility], [facility] Guide Direct Dial: [phone]  Fax: [phone] Website: [email or web address]

## 2024-05-19 NOTE — Progress Notes (Signed)
 Care Guide Pharmacy Note  05/19/2024 Name: Kelli Contreras MRN: [id number] DOB: May 07, 1951  Referred By: Joshua Debby CROME, MD Reason for referral: Call Attempt #1 and Complex Care Management (Outreach to schedule referral with pharmacist )   Kelli Contreras is a 73 y.o. year old female who is a primary care patient of Joshua Debby CROME, MD.  Kelli Contreras was referred to the pharmacist for assistance related to: HTN  A third unsuccessful telephone outreach was attempted today to contact the patient who was referred to the pharmacy team for assistance with medication management. The Population Health team is pleased to engage with this patient at any time in the future upon receipt of referral and should he/she be interested in assistance from the [facility] team.  Thedford Franks, CMA, [facility]  [facility], [facility] Guide Direct Dial: [phone]  Fax: [phone] Website: [email or web address]

## 2024-05-31 ENCOUNTER — Other Ambulatory Visit: Admitting: Pharmacist

## 2024-05-31 DIAGNOSIS — I10 Essential (primary) hypertension: Secondary | ICD-10-CM

## 2024-05-31 NOTE — Progress Notes (Signed)
 05/31/2024 Name: Kelli Contreras MRN: [id number] DOB: Mar 05, 1951  Chief Complaint  Patient presents with   Hypertension   Medication Management    Kelli Contreras is a 72 y.o. year old female who presented for a telephone visit.   They were referred to the pharmacist by their PCP for assistance in managing hypertension.   Subjective:  Care Team: Primary Care Provider: Joshua Debby CROME, MD ; Next Scheduled Visit: none scheduled  Medication Access/Adherence  Current Pharmacy:  [id number] [place], [place] - [address] [place] [address] Phone: [phone] Fax: [phone]  [facility] - [place], [place] - [address] [place] [address] Phone: [phone] Fax: [phone]   Patient reports affordability concerns with their medications: No  Patient reports access/transportation concerns to their pharmacy: No  Patient reports adherence concerns with their medications:  No     Hypertension:  Current medications: metoprolol  succinate 25 mg daily, spironolactone  25 mg daily Medications previously tried: olmesartan , hydralazine  (d/c due to hypotension), torsemide  (d/c due to hypotension)  Patient has a validated, automated, upper arm home BP cuff Current blood pressure readings: 117/84, 112/79, 147/91  She reports dizziness has improved since med changes but does report significant fatigue that impacts her ADLs. She denies SOB or chest pain.  Objective: BP Readings from Last 3 Encounters:  05/08/24 114/84  04/10/24 102/76  02/28/24 118/62     Lab Results  Component Value Date   HGBA1C 5.4 02/18/2015    Lab Results  Component Value Date   CREATININE 1.25 (H) 04/10/2024   BUN 18 04/10/2024   NA 138 04/10/2024   K 4.6 04/10/2024   CL 105 04/10/2024   CO2 26 04/10/2024    Lab Results  Component Value Date   CHOL 128 11/29/2023   HDL 46 11/29/2023   LDLCALC 68 11/29/2023    TRIG 68 11/29/2023   CHOLHDL 2.8 11/29/2023    Medications Reviewed Today     Reviewed by Kelli Contreras, RPH (Pharmacist) on 06/02/24 at 1626  Med List Status: <None>   Medication Order Taking? Sig Documenting Provider Last Dose Status Informant  aspirin 81 MG tablet [id number]  Take 81 mg by mouth at bedtime.  [provider]  Active Self           Med Note BEVERLEE, Kelli Contreras   Wed Feb 28, 2020 12:55 AM)    atorvastatin  (LIPITOR) 20 MG tablet [id number]  Take 1 tablet (20 mg total) by mouth daily. Kelli Debby CROME, MD  Active   DULoxetine  (CYMBALTA ) 30 MG capsule [id number]  TAKE 1 CAPSULE BY MOUTH EVERY DAY Kelli Debby CROME, MD  Active   imipramine  (TOFRANIL ) 50 MG tablet [id number]  Take 2 tablets (100 mg total) by mouth at bedtime. Kelli Debby CROME, MD  Active   metoprolol  succinate (TOPROL -XL) 25 MG 24 hr tablet [id number] Yes TAKE 1 TABLET (25 MG TOTAL) BY MOUTH AT BEDTIME. ANNUAL APPT DUE IN JULY MUST SEE PROVIDER FOR FUTURE REFILLS Kelli Debby CROME, MD  Active   nitroGLYCERIN  (NITROSTAT ) 0.4 MG SL tablet [id number]  Place 1 tablet (0.4 mg total) under the tongue every 5 (five) minutes as needed for chest pain. Kelli Redell PARAS, MD  Active   ondansetron  (ZOFRAN -ODT) 4 MG disintegrating tablet [id number]  Take 1 tablet (4 mg total) by mouth every 8 (eight) hours as needed for nausea or vomiting. Kelli Debby CROME, MD  Active  pantoprazole  (PROTONIX ) 40 MG tablet [id number]  TAKE 1 TABLET BY MOUTH EVERY DAY Kelli Debby CROME, MD  Active   spironolactone  (ALDACTONE ) 25 MG tablet [id number] Yes Take 1 tablet (25 mg total) by mouth daily. Kelli Debby CROME, MD  Active   SUMAtriptan  (IMITREX ) 100 MG tablet [id number]  Take 1/2 tab (50mg ) at onset of migraine. May take other half tab in 2 hours if headache persists or recurs. Kelli Contreras, Kelli HERO, MD  Active   topiramate  (TOPAMAX ) 50 MG tablet [id number]  Take 1 tablet (50 mg total) by mouth 2 (two) times daily. Kelli Debby CROME, MD  Active                Assessment/Plan:   Hypertension: - Currently controlled , BP goal <130/80 - unsure if fatigue is related to blood pressure - Reviewed appropriate blood pressure monitoring technique and reviewed goal blood pressure. Recommended to check home blood pressure and heart rate daily - Cardio confirmed no CHF diagnosis.  - Recommended to d/c metoprolol . Will f/u next week to see if any improvement in fatigue and BP.   Follow Up Plan: 8/7  Kelli Contreras, PharmD, BCPS Clinical Pharmacist [place] [facility] at [facility] [phone]

## 2024-06-03 ENCOUNTER — Other Ambulatory Visit: Payer: Self-pay | Admitting: Internal Medicine

## 2024-06-03 DIAGNOSIS — G43709 Chronic migraine without aura, not intractable, without status migrainosus: Secondary | ICD-10-CM

## 2024-06-05 NOTE — Patient Instructions (Signed)
 It was a pleasure speaking with you today!  Stop metoprolol  succinate. Continue checking blood pressure at home and keep a log. I will call back 8/7 to see how you are doing.  Feel free to call with any questions or concerns!  Darrelyn Drum, PharmD, BCPS, CPP Clinical Pharmacist Practitioner [place] [facility] at [facility] [phone]

## 2024-06-12 ENCOUNTER — Ambulatory Visit: Payer: Self-pay

## 2024-06-12 NOTE — Telephone Encounter (Signed)
 Patient reports BP today was 163/103 today. Similar reading earlier today. Requesting advise on if she should re-start one of the discontinued BP meds.

## 2024-06-12 NOTE — Telephone Encounter (Signed)
 Patient asking if she should re-start BP med d/t elevated BP today.  Message routed to PCP office high priority. Call to CAL to make them aware.   This RN contacted patient to make her aware that she may not hear back until tomorrow due to it being end of day for the office.   Patient denied any symptoms. ED precautions reviewed, pt verbalized understanding.   Reason for Disposition  Systolic BP >= 160 OR Diastolic >= 100  Answer Assessment - Initial Assessment Questions 1. BLOOD PRESSURE: What is your blood pressure? Did you take at least two measurements 5 minutes apart?     163/103  2. ONSET: When did you take your blood pressure?     Twice today  3. HOW: How did you take your blood pressure? (e.g., automatic home BP monitor, visiting nurse)     Monitor  4. HISTORY: Do you have a history of high blood pressure?     Yes, meds were d/c'd due to hypotension  5. MEDICINES: Are you taking any medicines for blood pressure? Have you missed any doses recently?     Recently discontinued  6. OTHER SYMPTOMS: Do you have any symptoms? (e.g., blurred vision, chest pain, difficulty breathing, headache, weakness)     Denies all  Protocols used: Blood Pressure - High-A-AH

## 2024-06-15 ENCOUNTER — Other Ambulatory Visit: Admitting: Pharmacist

## 2024-06-15 DIAGNOSIS — I10 Essential (primary) hypertension: Secondary | ICD-10-CM

## 2024-06-15 MED ORDER — AMLODIPINE BESYLATE 2.5 MG PO TABS: 2.5000 mg | ORAL_TABLET | Freq: Every day | ORAL | Status: DC

## 2024-06-15 NOTE — Telephone Encounter (Signed)
 Unable to reach patient. LMTRC

## 2024-06-15 NOTE — Progress Notes (Signed)
 05/31/2024 Name: Kelli Contreras MRN: [id number] DOB: 03/26/1951  Chief Complaint  Patient presents with   Hypertension   Medication Management    Kelli Contreras is a 73 y.o. year old female who presented for a telephone visit.   They were referred to the pharmacist by their PCP for assistance in managing hypertension.   Subjective:  Care Team: Primary Care Provider: Joshua Debby CROME, MD ; Next Scheduled Visit: none scheduled  Medication Access/Adherence  Current Pharmacy:  [id number] [place], [place] - [address] [place] [address] Phone: [phone] Fax: [phone]  [facility] - [place], [place] - [address] [place] [address] Phone: [phone] Fax: [phone]   Patient reports affordability concerns with their medications: No  Patient reports access/transportation concerns to their pharmacy: No  Patient reports adherence concerns with their medications:  No     Hypertension:  Current medications: spironolactone  25 mg daily Medications previously tried: olmesartan , hydralazine  (d/c due to hypotension), torsemide  (d/c due to hypotension), metoprolol  (d/c due to fatigue)  Patient has a validated, automated, upper arm home BP cuff Current blood pressure readings:  8/5 AM 168/103, PM 133/107 (HR 106) 8/6 AM 156/95, PM 148/106 (103) this morning 131/87 *Reports headache all day yesterday and still today. Has not taken medication for it.  -Advises that her blood pressure ran high while on vacation -She reports fatigue has improved after metoprolol  d/c. She denies SOB or chest pain. -She reports swelling in left foot and ankle while on vacation, no pain, redness  - Patient had recent visit with cardiology, does not have CHF, mild CAD  Objective: BP Readings from Last 3 Encounters:  05/08/24 114/84  04/10/24 102/76  02/28/24 118/62     Lab Results  Component Value Date    HGBA1C 5.4 02/18/2015    Lab Results  Component Value Date   CREATININE 1.25 (H) 04/10/2024   BUN 18 04/10/2024   NA 138 04/10/2024   K 4.6 04/10/2024   CL 105 04/10/2024   CO2 26 04/10/2024    Lab Results  Component Value Date   CHOL 128 11/29/2023   HDL 46 11/29/2023   LDLCALC 68 11/29/2023   TRIG 68 11/29/2023   CHOLHDL 2.8 11/29/2023    Medications Reviewed Today     Reviewed by Merceda Lela SAUNDERS, RPH (Pharmacist) on 06/15/24 at 1354  Med List Status: <None>   Medication Order Taking? Sig Documenting Provider Last Dose Status Informant  aspirin 81 MG tablet [id number]  Take 81 mg by mouth at bedtime.  [provider]  Active Self           Med Note BEVERLEE, JEFFREY W   Wed Feb 28, 2020 12:55 AM)    atorvastatin  (LIPITOR) 20 MG tablet [id number]  Take 1 tablet (20 mg total) by mouth daily. Joshua Debby CROME, MD  Active   DULoxetine  (CYMBALTA ) 30 MG capsule [id number]  TAKE 1 CAPSULE BY MOUTH EVERY DAY Joshua Debby CROME, MD  Active   imipramine  (TOFRANIL ) 50 MG tablet [id number]  Take 2 tablets (100 mg total) by mouth at bedtime. Joshua Debby CROME, MD  Active   nitroGLYCERIN  (NITROSTAT ) 0.4 MG SL tablet [id number]  Place 1 tablet (0.4 mg total) under the tongue every 5 (five) minutes as needed for chest pain. Monetta Redell PARAS, MD  Active   ondansetron  (ZOFRAN -ODT) 4 MG disintegrating tablet [id number]  Take 1 tablet (4 mg total)  by mouth every 8 (eight) hours as needed for nausea or vomiting. Joshua Debby CROME, MD  Active   pantoprazole  (PROTONIX ) 40 MG tablet [id number]  TAKE 1 TABLET BY MOUTH EVERY DAY Joshua Debby CROME, MD  Active   spironolactone  (ALDACTONE ) 25 MG tablet [id number] Yes Take 1 tablet (25 mg total) by mouth daily. Joshua Debby CROME, MD  Active   SUMAtriptan  (IMITREX ) 100 MG tablet [id number]  Take 1/2 tab (50mg ) at onset of migraine. May take other half tab in 2 hours if headache persists or recurs. Melonie Colonel, Mikel HERO, MD  Active   topiramate  (TOPAMAX ) 50 MG tablet  [id number]  TAKE 1 TABLET BY MOUTH TWICE A DAY Joshua Debby CROME, MD  Active               Assessment/Plan:   Hypertension: - Currently uncontrolled , BP goal <130/80  - Reviewed appropriate blood pressure monitoring technique and reviewed goal blood pressure. Recommended to check home blood pressure and heart rate daily - Cardio confirmed no CHF diagnosis.  - Restart Amlodipine  2.5mg  daily if blood pressure above 140 systolic (top) or 90 diastolic (bottom) - Continue spironolactone  - Take tylenol  for headache, if does not go away or if BP stays elevated, advised to schedule appt with provider to be evaluated - advised to be seen if LLE swelling returns  Follow Up Plan: 8/25  Darrelyn Drum, PharmD, BCPS Clinical Pharmacist [place] [facility] at [facility] [phone]

## 2024-06-15 NOTE — Telephone Encounter (Signed)
**Note De-identified  [place] Obfuscation** Please advise 

## 2024-06-15 NOTE — Patient Instructions (Signed)
 It was a pleasure speaking with you today! Restart Amlodipine  2.5 mg daily if your top number is above 140 or your bottom number is above 90   Continue taking spironolactone  25mg  daily.   Take Tylenol  if headache does not improve.   We will follow up on 8/25   Feel free to call with any questions or concerns!  Lum Ricks, PharmD Candidate  [employer], [employer]   Darrelyn Drum, PharmD, [employer], CPP Clinical Pharmacist Practitioner [place] [facility] at [facility] [phone]

## 2024-06-15 NOTE — Telephone Encounter (Signed)
Yes, restart meds.

## 2024-06-16 NOTE — Telephone Encounter (Signed)
 Patient has been made aware. Gave a verbal understanding.

## 2024-06-25 ENCOUNTER — Other Ambulatory Visit: Payer: Self-pay | Admitting: Internal Medicine

## 2024-06-25 DIAGNOSIS — G43709 Chronic migraine without aura, not intractable, without status migrainosus: Secondary | ICD-10-CM

## 2024-07-03 ENCOUNTER — Other Ambulatory Visit: Admitting: Pharmacist

## 2024-07-03 DIAGNOSIS — I10 Essential (primary) hypertension: Secondary | ICD-10-CM

## 2024-07-03 NOTE — Patient Instructions (Signed)
 It was a pleasure speaking with you today!  - Hold amlodipine  2.5 mg for now while BP running low - Continue spironolactone  - If symptoms do not improve by end of this week, advised to call for an appt with a provider for evaluation  Next call: 9/8  Feel free to call with any questions or concerns!  Darrelyn Drum, PharmD, BCPS, CPP Clinical Pharmacist Practitioner [place] [facility] at [facility] [phone]

## 2024-07-03 NOTE — Progress Notes (Signed)
 05/31/2024 Name: Kelli Contreras MRN: [id number] DOB: 1951/01/11  Chief Complaint  Patient presents with   Hypertension   Medication Management    Kelli Contreras is a 73 y.o. year old female who presented for a telephone visit.   They were referred to the pharmacist by their PCP for assistance in managing hypertension.    Subjective:  Care Team: Primary Care Provider: Joshua Debby CROME, MD ; Next Scheduled Visit: none scheduled  Medication Access/Adherence  Current Pharmacy:  [id number] - [place], [place] - [address] [facility] [place] [address] Phone: [phone] Fax: [phone]  [facility] - [place], [place] - [address] [place] [address] Phone: [phone] Fax: [phone]   Patient reports affordability concerns with their medications: No  Patient reports access/transportation concerns to their pharmacy: No  Patient reports adherence concerns with their medications:  No    Pt notes she has been experiencing cold symptoms (congestion, headache, fatigue) since Friday, 8/22. She has taken alka seltzer plus for cold and flu for symptoms.  Hypertension:  Current medications: spironolactone  25 mg daily, amlodipine  2.5 mg daily Medications previously tried: olmesartan , hydralazine  (d/c due to hypotension), torsemide  (d/c due to hypotension), metoprolol  (d/c due to fatigue)  Patient has a validated, automated, upper arm home BP cuff Current blood pressure readings:  8/8 140/87 (80) 8/10 118/84 (70) 8/11 134/99 (96) 8/22 111/76 (105) 8/23 96/78 (121) 8/25 72/79 (112)   -She reported fatigue improved after metoprolol  d/c. She denies SOB or chest pain.  - Patient had recent visit with cardiology, does not have CHF, mild CAD  Objective: BP Readings from Last 3 Encounters:  05/08/24 114/84  04/10/24 102/76  02/28/24 118/62     Lab Results  Component Value Date   HGBA1C 5.4  02/18/2015    Lab Results  Component Value Date   CREATININE 1.25 (H) 04/10/2024   BUN 18 04/10/2024   NA 138 04/10/2024   K 4.6 04/10/2024   CL 105 04/10/2024   CO2 26 04/10/2024    Lab Results  Component Value Date   CHOL 128 11/29/2023   HDL 46 11/29/2023   LDLCALC 68 11/29/2023   TRIG 68 11/29/2023   CHOLHDL 2.8 11/29/2023    Medications Reviewed Today     Reviewed by Merceda Lela SAUNDERS, RPH (Pharmacist) on 07/03/24 at 1604  Med List Status: <None>   Medication Order Taking? Sig Documenting Provider Last Dose Status Informant  amLODipine  (NORVASC ) 2.5 MG tablet [id number] Yes Take 1 tablet (2.5 mg total) by mouth daily. Joshua Debby CROME, MD  Active   aspirin 81 MG tablet [id number]  Take 81 mg by mouth at bedtime.  [provider]  Active Self           Med Note BEVERLEE, JEFFREY W   Wed Feb 28, 2020 12:55 AM)    atorvastatin  (LIPITOR) 20 MG tablet [id number]  Take 1 tablet (20 mg total) by mouth daily. Joshua Debby CROME, MD  Active   DULoxetine  (CYMBALTA ) 30 MG capsule [id number]  TAKE 1 CAPSULE BY MOUTH EVERY DAY Joshua Debby CROME, MD  Active   imipramine  (TOFRANIL ) 50 MG tablet [id number]  Take 2 tablets (100 mg total) by mouth at bedtime. Joshua Debby CROME, MD  Active   nitroGLYCERIN  (NITROSTAT ) 0.4 MG SL tablet [id number]  Place 1 tablet (0.4 mg total) under the tongue every 5 (five) minutes as needed for chest pain. Monetta Redell PARAS,  MD  Active   ondansetron  (ZOFRAN -ODT) 4 MG disintegrating tablet [id number]  Take 1 tablet (4 mg total) by mouth every 8 (eight) hours as needed for nausea or vomiting. Joshua Debby CROME, MD  Active   pantoprazole  (PROTONIX ) 40 MG tablet [id number]  TAKE 1 TABLET BY MOUTH EVERY DAY Joshua Debby CROME, MD  Active   spironolactone  (ALDACTONE ) 25 MG tablet [id number] Yes Take 1 tablet (25 mg total) by mouth daily. Joshua Debby CROME, MD  Active   SUMAtriptan  (IMITREX ) 100 MG tablet [id number]  Take 1/2 tab (50mg ) at onset of migraine. May take other half tab in  2 hours if headache persists or recurs. Melonie Colonel, Mikel HERO, MD  Active   topiramate  (TOPAMAX ) 50 MG tablet [id number]  TAKE 1 TABLET BY MOUTH TWICE A DAY Joshua Debby CROME, MD  Active               Assessment/Plan:   Hypertension: - Currently uncontrolled , BP goal <130/80 - BP have become low since she has had cold symptoms along with elevated HR. - Reviewed appropriate blood pressure monitoring technique and reviewed goal blood pressure. Recommended to check home blood pressure and heart rate daily - Cardio confirmed no CHF diagnosis.  - Hold amlodipine  2.5 mg for now while BP running low - Continue spironolactone  - If symptoms do not improve by end of this week, advised to call for an appt with a provider for evaluation  Follow Up Plan: 9/8  Darrelyn Drum, PharmD, BCPS Clinical Pharmacist [facility] [phone]

## 2024-07-14 DIAGNOSIS — R0981 Nasal congestion: Secondary | ICD-10-CM | POA: Diagnosis not present

## 2024-07-14 DIAGNOSIS — R070 Pain in throat: Secondary | ICD-10-CM | POA: Diagnosis not present

## 2024-07-14 DIAGNOSIS — R059 Cough, unspecified: Secondary | ICD-10-CM | POA: Diagnosis not present

## 2024-07-17 ENCOUNTER — Other Ambulatory Visit: Admitting: Pharmacist

## 2024-07-17 DIAGNOSIS — I10 Essential (primary) hypertension: Secondary | ICD-10-CM

## 2024-07-17 NOTE — Progress Notes (Addendum)
 05/31/2024 Name: Kelli Contreras MRN: [id number] DOB: [birth year]  Chief Complaint  Patient presents with   Hypertension   Medication Management    Kelli Contreras is a 73 y.o. year old female who presented for a telephone visit.   They were referred to the pharmacist by their PCP for assistance in managing hypertension.    Subjective:  Care Team: Primary Care Provider: Joshua Debby CROME, MD ; Next Scheduled Visit: none scheduled  Medication Access/Adherence  Current Pharmacy:  [id number] - [place], [place] - [address] [facility] [place] [address] Phone: [phone] Fax: [phone]  [facility] - [place], [place] - [address] [place] [address] Phone: [phone] Fax: [phone]   Patient reports affordability concerns with their medications: No  Patient reports access/transportation concerns to their pharmacy: No  Patient reports adherence concerns with their medications:  No    Pt notes she has been experiencing cold symptoms (congestion, headache, fatigue) since Friday, 8/22. She went to urgent care and was given antibiotics for sinus infection which she is still taking currently.  Hypertension:  Current medications: spironolactone  25 mg daily Medications previously tried: olmesartan , hydralazine  (d/c due to hypotension), torsemide  (d/c due to hypotension), metoprolol  (d/c due to fatigue), amlodipine  2.5 mg (stopped due to hypotension)  Patient has a validated, automated, upper arm home BP cuff Current blood pressure readings:  Reports HR in the 90s and BP has been looking good per patient, no more low BP   -She reported fatigue improved after metoprolol  d/c. She denies SOB or chest pain.  - Patient had recent visit with cardiology, does not have CHF, mild CAD  Objective: BP Readings from Last 3 Encounters:  05/08/24 114/84  04/10/24 102/76  02/28/24 118/62     Lab Results   Component Value Date   HGBA1C 5.4 02/18/2015    Lab Results  Component Value Date   CREATININE 1.25 (H) 04/10/2024   BUN 18 04/10/2024   NA 138 04/10/2024   K 4.6 04/10/2024   CL 105 04/10/2024   CO2 26 04/10/2024    Lab Results  Component Value Date   CHOL 128 11/29/2023   HDL 46 11/29/2023   LDLCALC 68 11/29/2023   TRIG 68 11/29/2023   CHOLHDL 2.8 11/29/2023    Medications Reviewed Today     Reviewed by Kelli Contreras, Kelli Contreras (Pharmacist) on 07/17/24 at 1642  Med List Status: <None>   Medication Order Taking? Sig Documenting Provider Last Dose Status Informant  amLODipine  (NORVASC ) 2.5 MG tablet [id number]  Take 1 tablet (2.5 mg total) by mouth daily.  Patient not taking: Reported on 07/17/2024   Joshua Debby CROME, MD  Active   aspirin 81 MG tablet [id number]  Take 81 mg by mouth at bedtime.  [provider]  Active Self           Med Note BEVERLEE, JEFFREY W   Wed Feb 28, 2020 12:55 AM)    atorvastatin  (LIPITOR) 20 MG tablet [id number]  Take 1 tablet (20 mg total) by mouth daily. Joshua Debby CROME, MD  Active   DULoxetine  (CYMBALTA ) 30 MG capsule [id number]  TAKE 1 CAPSULE BY MOUTH EVERY DAY Joshua Debby CROME, MD  Active   imipramine  (TOFRANIL ) 50 MG tablet [id number]  Take 2 tablets (100 mg total) by mouth at bedtime. Joshua Debby CROME, MD  Active   nitroGLYCERIN  (NITROSTAT ) 0.4 MG SL tablet [id number]  Place 1 tablet (0.4  mg total) under the tongue every 5 (five) minutes as needed for chest pain. Monetta Redell PARAS, MD  Active   ondansetron  (ZOFRAN -ODT) 4 MG disintegrating tablet [id number]  Take 1 tablet (4 mg total) by mouth every 8 (eight) hours as needed for nausea or vomiting. Joshua Debby CROME, MD  Active   pantoprazole  (PROTONIX ) 40 MG tablet [id number]  TAKE 1 TABLET BY MOUTH EVERY DAY Joshua Debby CROME, MD  Active   spironolactone  (ALDACTONE ) 25 MG tablet [id number] Yes Take 1 tablet (25 mg total) by mouth daily. Joshua Debby CROME, MD  Active   SUMAtriptan  (IMITREX ) 100 MG tablet  [id number]  Take 1/2 tab (50mg ) at onset of migraine. May take other half tab in 2 hours if headache persists or recurs. Melonie Colonel, Mikel HERO, MD  Active   topiramate  (TOPAMAX ) 50 MG tablet [id number]  TAKE 1 TABLET BY MOUTH TWICE A DAY Joshua Debby CROME, MD  Active               Assessment/Plan:   Hypertension: - Currently controlled , BP goal <130/80  - Reviewed appropriate blood pressure monitoring technique and reviewed goal blood pressure. Recommended to check home blood pressure and heart rate daily - Cardio confirmed no CHF diagnosis.  - Hold amlodipine  2.5 mg daily - Continue spironolactone    Follow Up Plan: PRN  Darrelyn Drum, PharmD, BCPS Clinical Pharmacist [facility] [phone]  Medical screening examination/treatment/procedure(s) were performed by non-physician practitioner and as supervising physician I was immediately available for consultation/collaboration.  I agree with above. Karlynn Noel, MD

## 2024-07-17 NOTE — Patient Instructions (Signed)
 It was a pleasure speaking with you today!  Continue monitoring blood pressure and notify us  if you are having readings above 130/80 often or readings below 100/60.  Feel free to call with any questions or concerns!  Darrelyn Drum, PharmD, BCPS, CPP Clinical Pharmacist Practitioner [facility] [phone]

## 2024-08-25 ENCOUNTER — Other Ambulatory Visit: Payer: Self-pay | Admitting: Internal Medicine

## 2024-08-25 DIAGNOSIS — Z13820 Encounter for screening for osteoporosis: Secondary | ICD-10-CM

## 2024-08-25 DIAGNOSIS — Z780 Asymptomatic menopausal state: Secondary | ICD-10-CM

## 2024-08-29 ENCOUNTER — Other Ambulatory Visit: Payer: Self-pay | Admitting: Internal Medicine

## 2024-08-29 DIAGNOSIS — E785 Hyperlipidemia, unspecified: Secondary | ICD-10-CM

## 2024-08-29 DIAGNOSIS — I10 Essential (primary) hypertension: Secondary | ICD-10-CM

## 2024-08-29 DIAGNOSIS — G43709 Chronic migraine without aura, not intractable, without status migrainosus: Secondary | ICD-10-CM

## 2024-08-29 DIAGNOSIS — I119 Hypertensive heart disease without heart failure: Secondary | ICD-10-CM

## 2024-08-29 MED ORDER — ATORVASTATIN CALCIUM 20 MG PO TABS: 20.0000 mg | ORAL_TABLET | Freq: Every day | ORAL | 0 refills | Status: DC

## 2024-08-29 MED ORDER — IMIPRAMINE HCL 50 MG PO TABS
100.0000 mg | ORAL_TABLET | Freq: Every day | ORAL | 0 refills | Status: DC
Start: 2024-08-29 — End: 2024-09-04

## 2024-08-29 NOTE — Telephone Encounter (Signed)
 When will I see her again?

## 2024-09-04 ENCOUNTER — Other Ambulatory Visit: Payer: Self-pay | Admitting: Internal Medicine

## 2024-09-04 DIAGNOSIS — K219 Gastro-esophageal reflux disease without esophagitis: Secondary | ICD-10-CM

## 2024-09-04 DIAGNOSIS — E785 Hyperlipidemia, unspecified: Secondary | ICD-10-CM

## 2024-09-04 DIAGNOSIS — E269 Hyperaldosteronism, unspecified: Secondary | ICD-10-CM

## 2024-09-04 DIAGNOSIS — G43709 Chronic migraine without aura, not intractable, without status migrainosus: Secondary | ICD-10-CM

## 2024-09-04 NOTE — Telephone Encounter (Signed)
 Copied from CRM [id number]. Topic: Clinical - Medication Refill >> Sep 04, 2024 10:48 AM Berneda FALCON wrote: Medication:  imipramine  (TOFRANIL ) 50 MG tablet pantoprazole  (PROTONIX ) 40 MG tablet atorvastatin  (LIPITOR) 20 MG tablet spironolactone  (ALDACTONE ) 25 MG tablet topiramate  (TOPAMAX ) 50 MG tablet  Has the patient contacted their pharmacy? Yes (Agent: If no, request that the patient contact the pharmacy for the refill. If patient does not wish to contact the pharmacy document the reason why and proceed with request.) (Agent: If yes, when and what did the pharmacy advise?)  This is the patient's preferred pharmacy:   [facility] - [place], [place] - [address] [place] [address] Phone: [phone] Fax: [phone]  Is this the correct pharmacy for this prescription? Yes If no, delete pharmacy and type the correct one.   Has the prescription been filled recently? No  Is the patient out of the medication? No  Has the patient been seen for an appointment in the last year OR does the patient have an upcoming appointment? Yes  Can we respond through MyChart? Yes  Agent: Please be advised that Rx refills may take up to 3 business days. We ask that you follow-up with your pharmacy.

## 2024-09-06 NOTE — Telephone Encounter (Signed)
 Pt called in and stated she missed a call from [facility] yesterday about refills and she left a VM.   Spoke to FD and they stated she did not reach out to pt. I did inform pt that refills are currently still pending.

## 2024-09-09 ENCOUNTER — Other Ambulatory Visit: Payer: Self-pay | Admitting: Internal Medicine

## 2024-09-09 DIAGNOSIS — E269 Hyperaldosteronism, unspecified: Secondary | ICD-10-CM

## 2024-09-09 DIAGNOSIS — G43709 Chronic migraine without aura, not intractable, without status migrainosus: Secondary | ICD-10-CM

## 2024-09-09 DIAGNOSIS — K219 Gastro-esophageal reflux disease without esophagitis: Secondary | ICD-10-CM

## 2024-09-09 DIAGNOSIS — E785 Hyperlipidemia, unspecified: Secondary | ICD-10-CM

## 2024-09-09 MED ORDER — ATORVASTATIN CALCIUM 20 MG PO TABS: 20.0000 mg | ORAL_TABLET | Freq: Every day | ORAL | 0 refills | Status: DC

## 2024-09-09 MED ORDER — PANTOPRAZOLE SODIUM 40 MG PO TBEC: 40.0000 mg | DELAYED_RELEASE_TABLET | Freq: Every day | ORAL | 0 refills | Status: DC

## 2024-09-09 MED ORDER — SPIRONOLACTONE 25 MG PO TABS: 25.0000 mg | ORAL_TABLET | Freq: Every day | ORAL | 0 refills | Status: DC

## 2024-09-09 MED ORDER — TOPIRAMATE 50 MG PO TABS: 50.0000 mg | ORAL_TABLET | Freq: Two times a day (BID) | ORAL | 0 refills | Status: DC

## 2024-09-09 MED ORDER — IMIPRAMINE HCL 50 MG PO TABS: 100.0000 mg | ORAL_TABLET | Freq: Every day | ORAL | 0 refills | Status: DC

## 2024-09-26 ENCOUNTER — Ambulatory Visit: Payer: Self-pay | Admitting: *Deleted

## 2024-09-26 NOTE — Telephone Encounter (Signed)
 FYI Only or Action Required?: FYI only for provider: appointment scheduled on 11/19.  Patient was last seen in primary care on 05/08/2024 by Joshua Debby CROME, MD.  Called Nurse Triage reporting Hypertension.  Symptoms began several days ago.  Interventions attempted: Nothing.  Symptoms are: unchanged.  Triage Disposition: See Physician Within 24 Hours  Patient/caregiver understands and will follow disposition?: Yes  Copied from CRM #[id number]. Topic: Clinical - Red Word Triage >> Sep 26, 2024  2:30 PM Suzen RAMAN wrote: Red Word that prompted transfer to Nurse Triage: Elevated Bp; questions about medication as well Reason for Disposition  Systolic BP >= 180 OR Diastolic >= 110  Answer Assessment - Initial Assessment Questions 1. BLOOD PRESSURE: What is your blood pressure? Did you take at least two measurements 5 minutes apart?     143/96, P 102, 105/86 P 84 , 123/79- patient is going to check her battery and bring cuff with her to appointment          186/103-Sat, 152/95- Sun 2. ONSET: When did you take your blood pressure?    last 3 days 3. HOW: How did you take your blood pressure? (e.g., automatic home BP monitor, visiting nurse)     Automatic cuff- arm 4. HISTORY: Do you have a history of high blood pressure?     yes 5. MEDICINES: Are you taking any medicines for blood pressure? Have you missed any doses recently?     Yes- no missed doses, patient states she was on 3 BP medications- but 2 were stopped due to her BP going low 6. OTHER SYMPTOMS: Do you have any symptoms? (e.g., blurred vision, chest pain, difficulty breathing, headache, weakness)     headache  Protocols used: Blood Pressure - High-A-AH

## 2024-09-27 ENCOUNTER — Encounter: Payer: Self-pay | Admitting: Family Medicine

## 2024-09-27 ENCOUNTER — Ambulatory Visit: Admitting: Family Medicine

## 2024-09-27 DIAGNOSIS — I10 Essential (primary) hypertension: Secondary | ICD-10-CM | POA: Diagnosis not present

## 2024-09-27 MED ORDER — AMLODIPINE BESYLATE 5 MG PO TABS: 5.0000 mg | ORAL_TABLET | Freq: Every day | ORAL | 1 refills | Status: AC

## 2024-09-27 NOTE — Progress Notes (Signed)
 Acute Office Visit  Subjective:     Patient ID: Kelli Contreras, female    DOB: 10-Apr-1951, 73 y.o.   MRN: [id number]  Chief Complaint  Patient presents with   Hypertension    Elevated BP readings at home around 186/103 x4 days   Headache    X1 week, states this headache feels different than usual migraine and tried Excedrin migraine with relief    Hypertension Associated symptoms include headaches.  Headache  Her past medical history is significant for hypertension.   Discussed the use of AI scribe software for clinical note transcription with the patient, who gave verbal consent to proceed.  History of Present Illness   Kelli Contreras is a 73 year old female with hypertension who presents with elevated blood pressure and headaches.  She has experienced elevated blood pressure for the past four days, accompanied by headaches. The headaches began before she checked her blood pressure, and she was in pain when she took the readings. She denies fever or chest pain.  Her current medications include amlodipine  2.5 mg daily and spironolactone  25 mg daily. Her blood pressure was previously low, leading to the discontinuation of some medications, including amlodipine  and hydrochlorothiazide . She cannot tolerate lisinopril  due to a cough and has been on metoprolol  and Benicar  in the past.  An MRI of her brain in June showed vascular changes and an old microhemorrhage, with moderately advanced white matter changes and chronic small vessel disease. Her kidney function in June showed a creatinine level of 1.25 and a glomerular filtration rate of 43. She experiences frequent urination.       Review of Systems  Neurological:  Positive for headaches.  All other systems reviewed and are negative.       Objective:    BP (!) 140/90   Pulse 90   Temp 98 F (36.7 C) (Oral)   Ht 5' 3 (1.6 m)   Wt 173 lb 11.2 oz (78.8 kg)   SpO2 98%   BMI 30.77 kg/m    Physical Exam Vitals  reviewed.  Constitutional:      Appearance: She is well-developed and normal weight.  Cardiovascular:     Rate and Rhythm: Normal rate and regular rhythm.     Heart sounds: No murmur heard. Pulmonary:     Effort: Pulmonary effort is normal.     Breath sounds: Normal breath sounds.  Neurological:     Mental Status: She is alert and oriented to person, place, and time. Mental status is at baseline.  Psychiatric:        Mood and Affect: Mood normal.        Behavior: Behavior normal.     No results found for any visits on 09/27/24.      Assessment & Plan:   Problem List Items Addressed This Visit       Unprioritized   Primary hypertension   Relevant Medications   amLODipine  (NORVASC ) 5 MG tablet    Meds ordered this encounter  Medications   amLODipine  (NORVASC ) 5 MG tablet    Sig: Take 1 tablet (5 mg total) by mouth daily.    Dispense:  30 tablet    Refill:  1  Assessment and Plan    Essential hypertension with chronic kidney disease stage 3 and chronic small vessel cerebrovascular disease Blood pressure elevated at 148/98 mmHg, likely due to recent headache and possible medication adjustments. Chronic kidney disease stage 3 with glomerular filtration rate of 43 mL/min. Chronic  small vessel cerebrovascular disease with moderately advanced white matter changes on MRI. I extensively reviewed the past medications she has been on, more recently olmesartan  and metoprolol  were discontinued. I recommend increasing the amlodipine  to 5 mg daily. Spironolactone  used for blood pressure management. Amlodipine  is beneficial for renal perfusion and blood pressure control, with potential side effect of ankle swelling. Spironolactone  aids in fluid management. - Increased amlodipine  to 5 mg daily. - Monitor blood pressure daily at home and record readings. - Follow up with Doctor Joshua in one month for blood pressure evaluation and medication adjustment. - Sent prescription for amlodipine  5  mg to pharmacy.  Migraine Recent headache possibly contributing to elevated blood pressure. Headache currently mild. Migraines may be triggered by high blood pressure. - Continue current migraine management regimen.        Return in about 1 month (around 10/27/2024) for HTN -- please follow up with Dr Joshua in 1 month for blood pressure.  Heron CHRISTELLA Sharper, MD

## 2024-09-27 NOTE — Patient Instructions (Signed)
 Check blood pressure once daily, write down readings and take them to Dr. Joshua.

## 2024-10-04 ENCOUNTER — Other Ambulatory Visit: Payer: Self-pay | Admitting: Internal Medicine

## 2024-10-04 DIAGNOSIS — G43709 Chronic migraine without aura, not intractable, without status migrainosus: Secondary | ICD-10-CM

## 2024-10-11 ENCOUNTER — Ambulatory Visit: Payer: Self-pay | Admitting: Internal Medicine

## 2024-10-11 ENCOUNTER — Other Ambulatory Visit: Payer: Self-pay | Admitting: Internal Medicine

## 2024-10-11 ENCOUNTER — Ambulatory Visit
Admission: RE | Admit: 2024-10-11 | Discharge: 2024-10-11 | Disposition: A | Source: Ambulatory Visit | Attending: Internal Medicine | Admitting: Internal Medicine

## 2024-10-11 ENCOUNTER — Encounter: Payer: Self-pay | Admitting: Internal Medicine

## 2024-10-11 DIAGNOSIS — N6321 Unspecified lump in the left breast, upper outer quadrant: Secondary | ICD-10-CM

## 2024-10-11 DIAGNOSIS — Z1231 Encounter for screening mammogram for malignant neoplasm of breast: Secondary | ICD-10-CM | POA: Insufficient documentation

## 2024-10-11 DIAGNOSIS — Z780 Asymptomatic menopausal state: Secondary | ICD-10-CM | POA: Diagnosis not present

## 2024-10-11 DIAGNOSIS — M810 Age-related osteoporosis without current pathological fracture: Secondary | ICD-10-CM

## 2024-10-11 DIAGNOSIS — Z13820 Encounter for screening for osteoporosis: Secondary | ICD-10-CM | POA: Diagnosis not present

## 2024-10-11 MED ORDER — FOSTEUM PLUS PO CAPS: 1.0000 | ORAL_CAPSULE | Freq: Two times a day (BID) | ORAL | 5 refills | Status: AC

## 2024-10-13 ENCOUNTER — Telehealth: Payer: Self-pay

## 2024-10-13 ENCOUNTER — Other Ambulatory Visit: Payer: Self-pay | Admitting: Internal Medicine

## 2024-10-13 DIAGNOSIS — M810 Age-related osteoporosis without current pathological fracture: Secondary | ICD-10-CM

## 2024-10-13 MED ORDER — DENOSUMAB 60 MG/ML ~~LOC~~ SOSY: 60.0000 mg | PREFILLED_SYRINGE | Freq: Once | SUBCUTANEOUS | Status: AC

## 2024-10-13 NOTE — Telephone Encounter (Signed)
 Prolia  VOB initiated via [email or web address]  Next Prolia  inj DUE: NEW START

## 2024-10-16 ENCOUNTER — Other Ambulatory Visit (HOSPITAL_COMMUNITY): Payer: Self-pay

## 2024-10-16 ENCOUNTER — Ambulatory Visit: Admitting: Physician Assistant

## 2024-10-16 NOTE — Telephone Encounter (Signed)
 Pt ready for scheduling for PROLIA  on or after : 10/16/24  Option# 1: Buy/Bill (Office supplied medication)  Out-of-pocket cost due at time of clinic visit: $372  Number of injection/visits approved: 2  Primary: [employer] Prolia  co-insurance: *UNDISCLOSED* (Following Medicare Advantage guideline: 20% coinsurance) Admin fee co-insurance: $40  Secondary: --- Prolia  co-insurance:  Admin fee co-insurance:   Medical Benefit Details: Date Benefits were checked: 10/16/24 Deductible: NO/ Coinsurance: 20%/ Admin Fee: $40  Prior Auth: APPROVED PA# [id number] Expiration Date: 10/16/24-11/08/25  # of doses approved: 2 ----------------------------------------------------------------------- Option# 2- Med Obtained from pharmacy:  Pharmacy benefit: Copay $64 (Paid to pharmacy) Admin Fee: $40 (Pay at clinic)  Prior Auth: N/A PA# Expiration Date:   # of doses approved:   If patient wants fill through the pharmacy benefit please send prescription to: [facility], and include estimated need by date in rx notes. Pharmacy will ship medication directly to the office.  Patient NOT eligible for Prolia  Copay Card. Copay Card can make patient's cost as little as $25. Link to apply: https://www.amgensupportplus.com/copay  ** This summary of benefits is an estimation of the patient's out-of-pocket cost. Exact cost may very based on individual plan coverage.

## 2024-10-16 NOTE — Telephone Encounter (Signed)
 SABRA

## 2024-10-16 NOTE — Telephone Encounter (Signed)
 MEDICAL PA SUBMITTED VIA LATENT. KEY: [id number]  PHARMACY: $64

## 2024-10-18 ENCOUNTER — Inpatient Hospital Stay: Admission: RE | Admit: 2024-10-18 | Discharge: 2024-10-18 | Attending: Internal Medicine | Admitting: Internal Medicine

## 2024-10-18 ENCOUNTER — Ambulatory Visit
Admission: RE | Admit: 2024-10-18 | Discharge: 2024-10-18 | Disposition: A | Discharge status: Home / Self Care | Source: Ambulatory Visit | Attending: Internal Medicine | Admitting: Internal Medicine

## 2024-10-18 DIAGNOSIS — N6321 Unspecified lump in the left breast, upper outer quadrant: Secondary | ICD-10-CM | POA: Insufficient documentation

## 2024-10-23 ENCOUNTER — Ambulatory Visit: Admitting: Physician Assistant

## 2024-10-23 VITALS — Ht 62.5 in | Wt 176.4 lb

## 2024-10-23 DIAGNOSIS — M810 Age-related osteoporosis without current pathological fracture: Secondary | ICD-10-CM

## 2024-10-23 NOTE — Progress Notes (Signed)
 Office Visit Note   Patient: Kelli Contreras           Date of Birth: May 01, 1951           MRN: [id number] Visit Date: 10/23/2024              Requested by: Joshua Debby CROME, MD [address] [place],  [place] [address] PCP: Joshua Debby CROME, MD   Assessment & Plan: Visit Diagnoses:  1. Age-related osteoporosis without current pathological fracture     Plan: Patient is a pleasant 73 year old woman who is referred by Dr. Joshua for osteoporosis.  She has never taken medication for osteoporosis in the past.  She has no history or family history of fracture of her hip spine or wrist.  She does not have a cardiac history.  She does not have a history of cancer.  She does have a history of kidney disease.  She has no history of gastric ulcer or gastric bypass surgery she does have severe reflux.  She has no history of seizures.  She does not really take any particular amount of calcium  or vitamin right now.  She has never been a smoker does not drink alcoholic beverages she describes her exercise as walking.  She said no major dental work she said her mother had a hip replacement but she is not sure this was from trauma or whether it was from bone-on-bone sounds more like arthritis.  She did have a bone density score and using her T-score she has a FRAX score of 16% or and chance of osteoporotic fracture in the next 10 years and a 4.6% risk of hip fracture which is slightly elevated.  We discussed all this.  Certainly it looks as though they were recommending Prolia .  She is not interested on going on an injectable at this time.  I have given her information about calcium  and vitamin D  and she is going to start some resistance training.  Reviewed the side effects and all the medication that is eligible.  I do not think she would be a candidate at this point with an anabolic.  She does not want to start Prolia .  Recommend new bone density scan in 2 years.  45 minutes were spent reviewing her chart and  talking about various treatments and lifestyle improvements she could make  Follow-Up Instructions: Return if symptoms worsen or fail to improve.   Orders:  No orders of the defined types were placed in this encounter.  No orders of the defined types were placed in this encounter.     Procedures: No procedures performed   Clinical Data: No additional findings.   Subjective: No chief complaint on file.   HPI pleasant 73 year old woman referred for evaluation of osteoporosis by Dr. Joshua  Review of Systems  All other systems reviewed and are negative.    Objective: Vital Signs: Ht 5' 2.5 (1.588 m)   Wt 176 lb 6.4 oz (80 kg)   BMI 31.75 kg/m   Physical Exam Constitutional:      Appearance: Normal appearance.  Pulmonary:     Effort: Pulmonary effort is normal.  Skin:    General: Skin is warm and dry.  Neurological:     General: No focal deficit present.     Mental Status: She is alert and oriented to person, place, and time.  Psychiatric:        Mood and Affect: Mood normal.        Behavior: Behavior  normal.       Specialty Comments:  No specialty comments available.  Imaging: No results found.   PMFS History: Patient Active Problem List   Diagnosis Date Noted   Abnormal EKG 04/10/2024   Cataract    IBS (irritable bowel syndrome)    Migraine    Age-related osteoporosis without current pathological fracture    Stage 3b chronic kidney disease (HCC) 10/20/2023   Abnormal electrocardiogram (ECG) (EKG) 10/20/2023   Chronic fatigue 10/19/2023   Hyperaldosteronism 06/10/2022   Senile purpura 06/03/2022   Splenic artery aneurysm 10/30/2021   Lung nodule seen on imaging study 10/20/2021   Grade I diastolic dysfunction 03/25/2021   Encounter for general adult medical examination with abnormal findings 02/21/2021   LVH (left ventricular hypertrophy) due to hypertensive disease, without heart failure 02/19/2021   Hyperlipidemia with target LDL less than  130 02/19/2021   Stage 3a chronic kidney disease (HCC) 02/19/2021   Chronic migraine without aura without status migrainosus, not intractable 06/07/2016   Gastroesophageal reflux disease without esophagitis 06/07/2016   Primary hypertension 12/01/2007   Past Medical History:  Diagnosis Date   Abnormal electrocardiogram (ECG) (EKG) 10/20/2023   Cataract    bil cataracts removed   Chest pressure 08/30/2023   Chronic fatigue 10/19/2023   Chronic migraine without aura without status migrainosus, not intractable 06/07/2016   Depression    Diarrhea    Encounter for general adult medical examination with abnormal findings 02/21/2021   Estrogen deficiency 02/24/2023   Gastroesophageal reflux disease without esophagitis 06/07/2016   Grade I diastolic dysfunction 03/25/2021   Mar 2022     IMPRESSIONS         1. Left ventricular ejection fraction, by estimation, is 60 to 65%. The left ventricle has normal function. The left ventricle has no regional wall motion abnormalities. There is mild left ventricular hypertrophy. Left ventricular diastolic parameters   are consistent with Grade I diastolic dysfunction (impaired relaxation).   2. Right ventricular systolic function   Hyperaldosteronism 06/10/2022   Hyperlipidemia with target LDL less than 130 02/19/2021   The ASCVD Risk score (Arnett DK, et al., 2019) failed to calculate for the following reasons:    The valid total cholesterol range is 130 to 320 mg/dL      Hypertension    IBS (irritable bowel syndrome)    diarrhea   Idiopathic hypotension 10/19/2023   Estimated Creatinine Clearance: 41 mL/min (A) (by C-G formula based on SCr of 1.27 mg/dL (H)).      Insomnia    Lung nodule seen on imaging study 10/20/2021   LVH (left ventricular hypertrophy) due to hypertensive disease, without heart failure 02/19/2021   Migraine    Osteoporosis    [employer]reesia 06/11/2020   Plantar fasciitis    Primary hypertension 12/01/2007   Estimated Creatinine  Clearance: 49.9 mL/min (by C-G formula based on SCr of 1.05 mg/dL).              Aldosterone     ng/dL    7       Comment: .  Unable to flag abnormal result(s), please refer      to reference range(s) below:  .  Adult Reference Ranges for Aldosterone, LC/MS/MS:      Upright  8:00 - 10:00 am    < or = 28 ng/dL      Upright  5:99 -  3:99 pm    < or = 21 ng/dL      Supine  Senile purpura 06/03/2022   SOB (shortness of breath) 08/30/2023   Splenic artery aneurysm 10/30/2021   Stage 3a chronic kidney disease (HCC) 02/19/2021   Estimated Creatinine Clearance: 58.6 mL/min (by C-G formula based on SCr of 0.9 mg/dL).     Estimated Creatinine Clearance: 55.5 mL/min (by C-G formula based on SCr of 0.93 mg/dL).      Estimated Creatinine Clearance: 48 mL/min (by C-G formula based on SCr of 1.08 mg/dL).      Stage 3b chronic kidney disease (HCC) 10/20/2023    Family History  Problem Relation Age of Onset   CAD Father 19   Cancer Father        bladder cancer   Diabetes Father    Heart disease Father    Hyperlipidemia Father    Hypertension Father    Valvular heart disease Mother        MVP   Heart disease Mother    Hyperlipidemia Mother    Hypertension Mother    Hypertension Brother    Hypertension Other        mother, father, brother   Breast cancer Maternal Aunt    Colon cancer Neg Hx    Esophageal cancer Neg Hx    Rectal cancer Neg Hx    Stomach cancer Neg Hx     Past Surgical History:  Procedure Laterality Date   ABDOMINAL HYSTERECTOMY     DUB; ovaries intact.   BREAST CYST ASPIRATION Bilateral    BUNIONECTOMY  2007   Dr. Vickye   EYE SURGERY N/A    [employer]reesia 06/11/2020   VESICOVAGINAL FISTULA CLOSURE W/ TAH  1985   Dr. Cary   Social History   Occupational History   Not on file  Tobacco Use   Smoking status: Never    Passive exposure: Never   Smokeless tobacco: Never  Vaping Use   Vaping status: Never Used  Substance and Sexual Activity   Alcohol use: No   Drug  use: Never   Sexual activity: Not Currently    Partners: Male

## 2024-10-31 ENCOUNTER — Other Ambulatory Visit: Payer: Self-pay | Admitting: Internal Medicine

## 2024-10-31 DIAGNOSIS — G43709 Chronic migraine without aura, not intractable, without status migrainosus: Secondary | ICD-10-CM

## 2024-10-31 DIAGNOSIS — E785 Hyperlipidemia, unspecified: Secondary | ICD-10-CM

## 2024-11-05 ENCOUNTER — Other Ambulatory Visit: Payer: Self-pay | Admitting: Internal Medicine

## 2024-11-05 DIAGNOSIS — G43709 Chronic migraine without aura, not intractable, without status migrainosus: Secondary | ICD-10-CM

## 2024-11-06 ENCOUNTER — Telehealth: Payer: Self-pay

## 2024-11-06 ENCOUNTER — Other Ambulatory Visit: Payer: Self-pay | Admitting: Internal Medicine

## 2024-11-06 DIAGNOSIS — G43709 Chronic migraine without aura, not intractable, without status migrainosus: Secondary | ICD-10-CM

## 2024-11-06 NOTE — Telephone Encounter (Signed)
 Copied from CRM #[id number]. Topic: Clinical - Medication Refill >> Nov 06, 2024  2:18 PM Shanda MATSU wrote: Medication: imipramine  (TOFRANIL ) 50 MG tablet   Has the patient contacted their pharmacy? Yes referred to provider (Agent: If no, request that the patient contact the pharmacy for the refill. If patient does not wish to contact the pharmacy document the reason why and proceed with request.) (Agent: If yes, when and what did the pharmacy advise?)  This is the patient's preferred pharmacy:  [id number] - [place], [place] - [address] [facility] [address] Phone: [phone] Fax: [phone]  Is this the correct pharmacy for this prescription? Yes If no, delete pharmacy and type the correct one.   Has the prescription been filled recently? No  Is the patient out of the medication? No  Has the patient been seen for an appointment in the last year OR does the patient have an upcoming appointment? Yes  Can we respond through MyChart? Yes  Agent: Please be advised that Rx refills may take up to 3 business days. We ask that you follow-up with your pharmacy.

## 2024-11-06 NOTE — Telephone Encounter (Signed)
 Patient has been scheduled

## 2024-11-06 NOTE — Telephone Encounter (Signed)
 Copied from CRM #[id number]. Topic: Clinical - Medication Question >> Nov 06, 2024  2:24 PM Shanda MATSU wrote: Reason for CRM: Patient called in req refill req for imipramine  (TOFRANIL ) 50 MG tablet, chart shows med was sent to pharmacy on 10/31/2024, but there is also a note that says patient needs appointment for refills, patient is req a call back to confirm whether she needs an appt or not.

## 2024-11-16 ENCOUNTER — Other Ambulatory Visit: Payer: Self-pay | Admitting: Internal Medicine

## 2024-11-22 ENCOUNTER — Other Ambulatory Visit: Payer: Self-pay | Admitting: Internal Medicine

## 2024-11-22 DIAGNOSIS — E785 Hyperlipidemia, unspecified: Secondary | ICD-10-CM

## 2024-11-30 ENCOUNTER — Telehealth: Payer: Self-pay

## 2024-11-30 ENCOUNTER — Other Ambulatory Visit: Payer: Self-pay | Admitting: Internal Medicine

## 2024-11-30 DIAGNOSIS — G43709 Chronic migraine without aura, not intractable, without status migrainosus: Secondary | ICD-10-CM

## 2024-11-30 NOTE — Telephone Encounter (Signed)
 Copied from CRM #[id number]. Topic: Clinical - Medication Refill >> Nov 30, 2024 11:21 AM Amber H wrote: Medication:  atorvastatin  (LIPITOR) 20 MG tablet topiramate  (TOPAMAX ) 50 MG tablet spironolactone  (ALDACTONE ) 25 MG tablet  imipramine  (TOFRANIL ) 50 MG tablet   Has the patient contacted their pharmacy? Yes, they sent over request however, there has been no response.  (Agent: If no, request that the patient contact the pharmacy for the refill. If patient does not wish to contact the pharmacy document the reason why and proceed with request.) (Agent: If yes, when and what did the pharmacy advise?)  This is the patient's preferred pharmacy:  [id number] - [place],  - [address] [facility] [place] [address] Phone: [phone] Fax: [phone]   Is this the correct pharmacy for this prescription? Yes If no, delete pharmacy and type the correct one.   Has the prescription been filled recently? Yes  Is the patient out of the medication? No but will be soon   Has the patient been seen for an appointment in the last year OR does the patient have an upcoming appointment? Yes, 09/27/2024  Can we respond through MyChart? Yes  Agent: Please be advised that Rx refills may take up to 3 business days. We ask that you follow-up with your pharmacy.

## 2024-12-01 ENCOUNTER — Other Ambulatory Visit: Payer: Self-pay

## 2024-12-01 DIAGNOSIS — G43709 Chronic migraine without aura, not intractable, without status migrainosus: Secondary | ICD-10-CM

## 2024-12-01 DIAGNOSIS — E269 Hyperaldosteronism, unspecified: Secondary | ICD-10-CM

## 2024-12-01 DIAGNOSIS — E785 Hyperlipidemia, unspecified: Secondary | ICD-10-CM

## 2024-12-01 MED ORDER — SPIRONOLACTONE 25 MG PO TABS: 25.0000 mg | ORAL_TABLET | Freq: Every day | ORAL | 1 refills | Status: AC

## 2024-12-01 MED ORDER — TOPIRAMATE 50 MG PO TABS: 50.0000 mg | ORAL_TABLET | Freq: Two times a day (BID) | ORAL | 1 refills | Status: AC

## 2024-12-01 MED ORDER — ATORVASTATIN CALCIUM 20 MG PO TABS: 20.0000 mg | ORAL_TABLET | Freq: Every day | ORAL | 1 refills | Status: AC

## 2024-12-01 MED ORDER — IMIPRAMINE HCL 50 MG PO TABS: 100.0000 mg | ORAL_TABLET | Freq: Every day | ORAL | 1 refills | Status: AC

## 2024-12-01 NOTE — Telephone Encounter (Signed)
 Medication has been refilled.

## 2024-12-06 ENCOUNTER — Ambulatory Visit: Admitting: Internal Medicine

## 2025-03-01 ENCOUNTER — Ambulatory Visit
